# Patient Record
Sex: Male | Born: 1970 | Race: Black or African American | Hispanic: No | Marital: Married | Smoking: Former smoker
Health system: Southern US, Community
[De-identification: ages and names within clinical notes are randomized; demographics above are authoritative.]

## PROBLEM LIST (undated history)

## (undated) DIAGNOSIS — L732 Hidradenitis suppurativa: Secondary | ICD-10-CM

## (undated) DIAGNOSIS — R569 Unspecified convulsions: Secondary | ICD-10-CM

## (undated) DIAGNOSIS — K223 Perforation of esophagus: Secondary | ICD-10-CM

## (undated) DIAGNOSIS — I10 Essential (primary) hypertension: Secondary | ICD-10-CM

## (undated) HISTORY — DX: Unspecified convulsions: R56.9

## (undated) HISTORY — DX: Perforation of esophagus: K22.3

---

## 2014-05-31 ENCOUNTER — Emergency Department (HOSPITAL_COMMUNITY): Admission: EM | Admit: 2014-05-31 | Discharge: 2014-05-31 | Discharge status: Absent without leave | Payer: Self-pay

## 2014-05-31 MED ORDER — ACETAMINOPHEN 325 MG PO TABS
ORAL_TABLET | ORAL | Status: AC
  Filled 2014-05-31: qty 2

## 2015-01-04 ENCOUNTER — Inpatient Hospital Stay: Payer: No Typology Code available for payment source | Admitting: Internal Medicine

## 2015-01-04 ENCOUNTER — Inpatient Hospital Stay: Payer: No Typology Code available for payment source

## 2015-01-04 ENCOUNTER — Emergency Department: Payer: No Typology Code available for payment source

## 2015-01-04 ENCOUNTER — Inpatient Hospital Stay
Admission: EM | Admit: 2015-01-04 | Discharge: 2015-01-05 | DRG: 305 | Disposition: A | Discharge status: Home / Self Care | Payer: No Typology Code available for payment source | Attending: Internal Medicine | Admitting: Internal Medicine

## 2015-01-04 DIAGNOSIS — N179 Acute kidney failure, unspecified: Secondary | ICD-10-CM | POA: Diagnosis present

## 2015-01-04 DIAGNOSIS — E860 Dehydration: Secondary | ICD-10-CM | POA: Diagnosis present

## 2015-01-04 DIAGNOSIS — K2100 Gastro-esophageal reflux disease with esophagitis, without bleeding: Secondary | ICD-10-CM | POA: Diagnosis present

## 2015-01-04 DIAGNOSIS — I10 Essential (primary) hypertension: Secondary | ICD-10-CM

## 2015-01-04 DIAGNOSIS — R4182 Altered mental status, unspecified: Secondary | ICD-10-CM | POA: Diagnosis present

## 2015-01-04 DIAGNOSIS — I160 Hypertensive urgency: Principal | ICD-10-CM | POA: Diagnosis present

## 2015-01-04 DIAGNOSIS — Z91128 Patient's intentional underdosing of medication regimen for other reason: Secondary | ICD-10-CM

## 2015-01-04 DIAGNOSIS — I151 Hypertension secondary to other renal disorders: Secondary | ICD-10-CM

## 2015-01-04 DIAGNOSIS — K529 Noninfective gastroenteritis and colitis, unspecified: Secondary | ICD-10-CM | POA: Diagnosis present

## 2015-01-04 DIAGNOSIS — R112 Nausea with vomiting, unspecified: Secondary | ICD-10-CM

## 2015-01-04 DIAGNOSIS — E872 Acidosis: Secondary | ICD-10-CM | POA: Diagnosis present

## 2015-01-04 DIAGNOSIS — T461X6A Underdosing of calcium-channel blockers, initial encounter: Secondary | ICD-10-CM | POA: Diagnosis present

## 2015-01-04 DIAGNOSIS — F17210 Nicotine dependence, cigarettes, uncomplicated: Secondary | ICD-10-CM | POA: Diagnosis present

## 2015-01-04 HISTORY — DX: Essential (primary) hypertension: I10

## 2015-01-04 LAB — CREATININE WHOLE BLOOD: Whole Blood Creatinine: 0.71 mg/dL (ref 0.40–1.10)

## 2015-01-04 LAB — HEMOLYSIS INDEX
Hemolysis Index: 12 (ref 0–18)
Hemolysis Index: 258 — ABNORMAL HIGH (ref 0–18)
Hemolysis Index: 221 — ABNORMAL HIGH (ref 0–18)
Hemolysis Index: 15 (ref 0–18)

## 2015-01-04 LAB — TROPONIN I
Troponin I: 0.02 ng/mL (ref 0.00–0.09)
Troponin I: 0.01 ng/mL (ref 0.00–0.09)
Troponin I: <0.01 ng/mL (ref 0.00–0.09)

## 2015-01-04 LAB — CBC AND DIFFERENTIAL
Basophils Absolute Automated: 0.04 10*3/uL (ref 0.00–0.20)
Basophils Automated: 0 %
Eosinophils Absolute Automated: 0.03 10*3/uL (ref 0.00–0.70)
Eosinophils Automated: 0 %
Hematocrit: 47.2 % (ref 42.0–52.0)
Hgb: 16.9 g/dL (ref 13.0–17.0)
Immature Granulocytes Absolute: 0.01 10*3/uL
Immature Granulocytes: 0 %
Lymphocytes Absolute Automated: 1.9 10*3/uL (ref 0.50–4.40)
Lymphocytes Automated: 25 %
MCH: 34.7 pg — ABNORMAL HIGH (ref 28.0–32.0)
MCHC: 35.8 g/dL (ref 32.0–36.0)
MCV: 96.9 fL (ref 80.0–100.0)
MPV: 10.8 fL (ref 9.4–12.3)
Monocytes Absolute Automated: 0.38 10*3/uL (ref 0.00–1.20)
Monocytes: 5 %
Neutrophils Absolute: 5.26 10*3/uL (ref 1.80–8.10)
Neutrophils: 69 %
Nucleated RBC: 0 /100 WBC (ref 0–1)
Platelets: 261 10*3/uL (ref 140–400)
RBC: 4.87 10*6/uL (ref 4.70–6.00)
RDW: 14 % (ref 12–15)
WBC: 7.61 10*3/uL (ref 3.50–10.80)

## 2015-01-04 LAB — GLUCOSE WHOLE BLOOD - POCT
Whole Blood Glucose POCT: 112 mg/dL — ABNORMAL HIGH (ref 70–100)
Whole Blood Glucose POCT: 120 mg/dL — ABNORMAL HIGH (ref 70–100)

## 2015-01-04 LAB — CK
Creatine Kinase (CK): 185 U/L (ref 47–267)
Creatine Kinase (CK): 158 U/L (ref 47–267)
Creatine Kinase (CK): 217 U/L (ref 47–267)

## 2015-01-04 LAB — COMPREHENSIVE METABOLIC PANEL
ALT: 11 U/L (ref 0–55)
AST (SGOT): 15 U/L (ref 5–34)
Albumin/Globulin Ratio: 1.1 (ref 0.9–2.2)
Albumin: 4.7 g/dL (ref 3.5–5.0)
Alkaline Phosphatase: 70 U/L (ref 38–106)
Anion Gap: 17 — ABNORMAL HIGH (ref 5.0–15.0)
BUN: 7 mg/dL — ABNORMAL LOW (ref 9.0–28.0)
Bilirubin, Total: 0.8 mg/dL (ref 0.2–1.2)
CO2: 20 mEq/L — ABNORMAL LOW (ref 22–29)
Calcium: 10.3 mg/dL (ref 8.5–10.5)
Chloride: 109 mEq/L (ref 100–111)
Creatinine: 1 mg/dL (ref 0.7–1.3)
Globulin: 4.1 g/dL — ABNORMAL HIGH (ref 2.0–3.6)
Glucose: 127 mg/dL — ABNORMAL HIGH (ref 70–100)
Potassium: 3.7 mEq/L (ref 3.5–5.1)
Protein, Total: 8.8 g/dL — ABNORMAL HIGH (ref 6.0–8.3)
Sodium: 146 mEq/L — ABNORMAL HIGH (ref 136–145)

## 2015-01-04 LAB — URINALYSIS, REFLEX TO MICROSCOPIC EXAM IF INDICATED
Bilirubin, UA: NEGATIVE
Blood, UA: NEGATIVE
Glucose, UA: 50 — AB
Leukocyte Esterase, UA: NEGATIVE
Nitrite, UA: NEGATIVE
Protein, UR: 100 — AB
Specific Gravity UA: 1.014 (ref 1.001–1.035)
Urine pH: 7 (ref 5.0–8.0)
Urobilinogen, UA: NEGATIVE mg/dL

## 2015-01-04 LAB — ECG 12-LEAD
Atrial Rate: 65 {beats}/min
Atrial Rate: 67 {beats}/min
P Axis: 52 degrees
P Axis: 56 degrees
P-R Interval: 200 ms
P-R Interval: 212 ms
Q-T Interval: 438 ms
Q-T Interval: 454 ms
QRS Duration: 118 ms
QRS Duration: 126 ms
QTC Calculation (Bezet): 455 ms
QTC Calculation (Bezet): 479 ms
R Axis: 74 degrees
R Axis: 81 degrees
T Axis: 44 degrees
T Axis: 55 degrees
Ventricular Rate: 65 {beats}/min
Ventricular Rate: 67 {beats}/min

## 2015-01-04 LAB — LACTIC ACID, PLASMA
Lactic Acid: 2.7 mmol/L — ABNORMAL HIGH (ref 0.2–2.0)
Lactic Acid: 2.3 mmol/L — ABNORMAL HIGH (ref 0.2–2.0)

## 2015-01-04 LAB — PT AND APTT
PT INR: 1.1 (ref 0.9–1.1)
PT: 13.7 s (ref 12.6–15.0)
PTT: 32 s (ref 23–37)

## 2015-01-04 LAB — RAPID DRUG SCREEN, URINE
Barbiturate Screen, UR: NEGATIVE
Benzodiazepine Screen, UR: NEGATIVE
Cannabinoid Screen, UR: POSITIVE — AB
Cocaine, UR: NEGATIVE
Opiate Screen, UR: POSITIVE — AB
PCP Screen, UR: NEGATIVE
Urine Amphetamine Screen: NEGATIVE

## 2015-01-04 LAB — GFR: EGFR: >60

## 2015-01-04 LAB — LIPASE: Lipase: 8 U/L (ref 8–78)

## 2015-01-04 LAB — IHS D-DIMER: D-Dimer: 0.3 ug/mL FEU (ref 0.00–0.51)

## 2015-01-04 MED ORDER — SODIUM CHLORIDE 0.9 % IV BOLUS
1000.0000 mL | Freq: Once | INTRAVENOUS | Status: AC
Start: 2015-01-04 — End: 2015-01-04
  Administered 2015-01-04: 1000 mL via INTRAVENOUS

## 2015-01-04 MED ORDER — MORPHINE SULFATE 4 MG/ML IJ/IV SOLN (WRAP)
4.0000 mg | Freq: Once | Status: AC
Start: 2015-01-04 — End: 2015-01-04
  Administered 2015-01-04: 4 mg via INTRAVENOUS
  Filled 2015-01-04: qty 1

## 2015-01-04 MED ORDER — ONDANSETRON HCL 4 MG/2ML IJ SOLN
4.0000 mg | Freq: Four times a day (QID) | INTRAMUSCULAR | Status: DC | PRN
Start: 2015-01-04 — End: 2015-01-05

## 2015-01-04 MED ORDER — METRONIDAZOLE IN NACL 500 MG/100 ML IV SOLN
500.0000 mg | Freq: Once | INTRAVENOUS | Status: AC
Start: 2015-01-04 — End: 2015-01-04
  Administered 2015-01-04: 500 mg via INTRAVENOUS
  Filled 2015-01-04: qty 100

## 2015-01-04 MED ORDER — HYDRALAZINE HCL 20 MG/ML IJ SOLN
10.0000 mg | Freq: Once | INTRAMUSCULAR | Status: DC | PRN
Start: 2015-01-04 — End: 2015-01-05

## 2015-01-04 MED ORDER — PROMETHAZINE HCL 25 MG/ML IJ SOLN
12.5000 mg | Freq: Once | INTRAMUSCULAR | Status: AC
Start: 2015-01-04 — End: 2015-01-04
  Administered 2015-01-04: 12.5 mg via INTRAVENOUS
  Filled 2015-01-04: qty 1

## 2015-01-04 MED ORDER — METOCLOPRAMIDE HCL 5 MG/ML IJ SOLN
10.0000 mg | Freq: Once | INTRAMUSCULAR | Status: AC
Start: 2015-01-04 — End: 2015-01-04
  Administered 2015-01-04: 10 mg via INTRAVENOUS
  Filled 2015-01-04: qty 2

## 2015-01-04 MED ORDER — LISINOPRIL 5 MG PO TABS
5.0000 mg | ORAL_TABLET | Freq: Every day | ORAL | Status: DC
Start: 2015-01-04 — End: 2015-01-05

## 2015-01-04 MED ORDER — INFLUENZA VAC SPLIT QUAD 0.5 ML IM SUSY
0.5000 mL | PREFILLED_SYRINGE | Freq: Once | INTRAMUSCULAR | Status: DC
Start: 2015-01-04 — End: 2015-01-05

## 2015-01-04 MED ORDER — NITROGLYCERIN 2 % TD OINT
TOPICAL_OINTMENT | TRANSDERMAL | Status: AC
Start: 2015-01-04 — End: 2015-01-04
  Filled 2015-01-04: qty 1

## 2015-01-04 MED ORDER — SODIUM CHLORIDE 0.9 % IV SOLN
5.0000 mg/h | INTRAVENOUS | Status: DC
Start: 2015-01-04 — End: 2015-01-05
  Administered 2015-01-04: 10 mg/h via INTRAVENOUS
  Administered 2015-01-04: 5 mg/h via INTRAVENOUS
  Administered 2015-01-04: 12 mg/h via INTRAVENOUS
  Filled 2015-01-04 (×4): qty 10

## 2015-01-04 MED ORDER — CIPROFLOXACIN IN D5W 400 MG/200ML IV SOLN
400.0000 mg | Freq: Once | INTRAVENOUS | Status: AC
Start: 2015-01-04 — End: 2015-01-04
  Administered 2015-01-04: 400 mg via INTRAVENOUS
  Filled 2015-01-04: qty 200

## 2015-01-04 MED ORDER — PROMETHAZINE HCL 25 MG/ML IJ SOLN
12.5000 mg | Freq: Four times a day (QID) | INTRAMUSCULAR | Status: AC | PRN
Start: 2015-01-04 — End: 2015-01-05
  Administered 2015-01-04 (×2): 12.5 mg via INTRAVENOUS
  Filled 2015-01-04 (×2): qty 1

## 2015-01-04 MED ORDER — ONDANSETRON HCL 4 MG/2ML IJ SOLN
4.0000 mg | Freq: Once | INTRAMUSCULAR | Status: AC
Start: 2015-01-04 — End: 2015-01-04
  Administered 2015-01-04: 4 mg via INTRAVENOUS
  Filled 2015-01-04: qty 2

## 2015-01-04 MED ORDER — IOHEXOL 350 MG/ML IV SOLN
100.0000 mL | Freq: Once | INTRAVENOUS | Status: AC | PRN
Start: 2015-01-04 — End: 2015-01-04
  Administered 2015-01-04: 100 mL via INTRAVENOUS

## 2015-01-04 MED ORDER — ENOXAPARIN SODIUM 40 MG/0.4ML SC SOLN
40.0000 mg | Freq: Every day | SUBCUTANEOUS | Status: DC
Start: 2015-01-04 — End: 2015-01-05
  Administered 2015-01-04 – 2015-01-05 (×2): 40 mg via SUBCUTANEOUS
  Filled 2015-01-04 (×2): qty 0.4

## 2015-01-04 MED ORDER — DEXMEDETOMIDINE HCL IN NACL 400 MCG/100ML IV SOLN
0.4000 ug/kg/h | INTRAVENOUS | Status: DC
Start: 2015-01-04 — End: 2015-01-04

## 2015-01-04 MED ORDER — MORPHINE SULFATE 10 MG/ML IJ/IV SOLN (WRAP)
6.0000 mg | Status: DC | PRN
Start: 2015-01-04 — End: 2015-01-05
  Administered 2015-01-04: 6 mg via INTRAVENOUS
  Filled 2015-01-04: qty 1

## 2015-01-04 MED ORDER — LABETALOL HCL 5 MG/ML IV SOLN
20.0000 mg | Freq: Once | INTRAVENOUS | Status: AC
Start: 2015-01-04 — End: 2015-01-04
  Administered 2015-01-04: 20 mg via INTRAVENOUS
  Filled 2015-01-04: qty 4

## 2015-01-04 MED ORDER — VANCOMYCIN ORAL SOLUTION 50 MG/ML UNIT DOSE
125.0000 mg | Freq: Four times a day (QID) | ORAL | Status: DC
Start: 2015-01-04 — End: 2015-01-05
  Administered 2015-01-04 – 2015-01-05 (×5): 125 mg via ORAL
  Filled 2015-01-04 (×8): qty 5

## 2015-01-04 MED ORDER — SODIUM CHLORIDE 0.9 % IV SOLN
INTRAVENOUS | Status: DC
Start: 2015-01-04 — End: 2015-01-05

## 2015-01-04 MED ORDER — DEXMEDETOMIDINE HCL IN NACL 400 MCG/100ML IV SOLN
0.4000 ug/kg/h | INTRAVENOUS | Status: DC
Start: 2015-01-04 — End: 2015-01-05
  Administered 2015-01-04: 0.4 ug/kg/h via INTRAVENOUS
  Administered 2015-01-05: 0.3 ug/kg/h via INTRAVENOUS
  Filled 2015-01-04 (×5): qty 100

## 2015-01-04 MED ORDER — CLONIDINE HCL 0.1 MG PO TABS
0.1000 mg | ORAL_TABLET | Freq: Once | ORAL | Status: AC
Start: 2015-01-04 — End: 2015-01-04
  Administered 2015-01-04: 0.1 mg via ORAL
  Filled 2015-01-04: qty 1

## 2015-01-04 MED ORDER — MORPHINE SULFATE 10 MG/ML IJ/IV SOLN (WRAP)
6.0000 mg | Freq: Once | Status: AC
Start: 2015-01-04 — End: 2015-01-04
  Administered 2015-01-04: 6 mg via INTRAVENOUS
  Filled 2015-01-04: qty 1

## 2015-01-04 MED ORDER — HYDRALAZINE HCL 20 MG/ML IJ SOLN
10.0000 mg | Freq: Once | INTRAMUSCULAR | Status: AC
Start: 2015-01-04 — End: 2015-01-04
  Administered 2015-01-04: 10 mg via INTRAVENOUS
  Filled 2015-01-04: qty 1

## 2015-01-04 NOTE — H&P (Addendum)
[facility]      Patient: Mike Rice  Date: 01/04/2015   DOB: 1970/06/17  Admission Date: 01/04/2015   MRN: [id number]  Attending: Jolyn Lent         Chief Complaint   Patient presents with   . Emesis   . Chills      History Gathered From: the patient and his wife at the bedside     HISTORY AND PHYSICAL     Mike Rice is a 44 y.o. male with a PMHx of uncontrolled HTN and non compliance with medication who presented with nausea and vomiting for few hours, not able to sleep due to vomiting, associated with sweating, nausea, epigastric and periumbilical pain.   He had cyst removed from his chin 2 weeks ago and his BP reported by his wife as 230/160 at that time and he was told to see doctor to start Antihypertensive but he didnt do so, he is visiting from [place]   Smokes 1 PPD, social Alcohol, denies drug abuse     Past Medical History   Diagnosis Date   . Hypertension        History reviewed. No pertinent past surgical history.    Prior to Admission medications    Medication Sig Start Date End Date Taking? Authorizing Provider   DOXYCYCLINE PO Take by mouth.   Yes [provider]       No Known Allergies    CODE STATUS: full code     PRIMARY CARE MD: No primary care provider on file.    History reviewed. No pertinent family history.    Social History   Substance Use Topics   . Smoking status: Unknown If Ever Smoked   . Smokeless tobacco: None   . Alcohol Use: None       REVIEW OF SYSTEMS   Positive for: nausea, vomiting, abdominal pain, chills, sweating  Negative for: headache, neck stiffness, dizziness, cough CP, SOB, diarrhea   All ROS completed and otherwise negative.    PHYSICAL EXAM     Vital Signs (most recent): BP 227/122 mmHg  Pulse 76  Temp(Src) 97.7 F (36.5 C) (Oral)  Resp 16  Ht 1.854 m (6\' 1" )  Wt 86.183 kg (190 lb)  BMI 25.07 kg/m2  SpO2 99%  Constitutional: patient looks distressed diaphoretic.  Patient speaks freely in full sentences.   HEENT:  NC/AT, PERRL, no scleral icterus or conjunctival pallor, no nasal discharge, MMM, oropharynx without erythema or exudate  Neck: trachea midline, supple, no cervical or supraclavicular lymphadenopathy or masses  Cardiovascular: RRR, normal S1 S2, no murmurs, gallops, palpable thrills, no JVD, Non-displaced PMI.  Respiratory: Normal rate. No retractions or increased work of breathing. Clear to auscultation and percussion bilaterally.  Gastrointestinal: +BS, non-distended, soft, epigastric tenderness, no rebound or guarding, no hepatosplenomegaly  Genitourinary: no suprapubic or costovertebral angle tenderness  Musculoskeletal: ROM and motor strength grossly normal. No clubbing, edema, or cyanosis. DP and radial pulses 2+ and symmetric.  Skin exam:  pink  Neurologic: EOMI, CN 2-12 grossly intact. no gross motor or sensory deficits  Psychiatric: AAOx3, affect and mood appropriate. The patient is alert, interactive, appropriate.  Capillary refill:  Normal    Exam done by Jolyn Lent, MD on 01/04/2015 at 7:55 AM      LABS & IMAGING     Recent Results (from the past 24 hour(s))   Comprehensive metabolic panel    Collection Time: 01/04/15  4:08 AM  Result Value Ref Range    Glucose 127 (H) 70 - 100 mg/dL    BUN 7.0 (L) 9.0 - 16.1 mg/dL    Creatinine 1.0 0.7 - 1.3 mg/dL    Sodium 096 (H) 045 - 145 mEq/L    Potassium 3.7 3.5 - 5.1 mEq/L    Chloride 109 100 - 111 mEq/L    CO2 20 (L) 22 - 29 mEq/L    Calcium 10.3 8.5 - 10.5 mg/dL    Protein, Total 8.8 (H) 6.0 - 8.3 g/dL    Albumin 4.7 3.5 - 5.0 g/dL    AST (SGOT) 15 5 - 34 U/L    ALT 11 0 - 55 U/L    Alkaline Phosphatase 70 38 - 106 U/L    Bilirubin, Total 0.8 0.2 - 1.2 mg/dL    Globulin 4.1 (H) 2.0 - 3.6 g/dL    Albumin/Globulin Ratio 1.1 0.9 - 2.2    Anion Gap 17.0 (H) 5.0 - 15.0   CBC with differential    Collection Time: 01/04/15  4:08 AM   Result Value Ref Range    WBC 7.61 3.50 - 10.80 x10 3/uL    Hgb 16.9 13.0 - 17.0 g/dL    Hematocrit 40.9 81.1 - 52.0 %     Platelets 261 140 - 400 x10 3/uL    RBC 4.87 4.70 - 6.00 x10 6/uL    MCV 96.9 80.0 - 100.0 fL    MCH 34.7 (H) 28.0 - 32.0 pg    MCHC 35.8 32.0 - 36.0 g/dL    RDW 14 12 - 15 %    MPV 10.8 9.4 - 12.3 fL    Neutrophils 69 None %    Lymphocytes Automated 25 None %    Monocytes 5 None %    Eosinophils Automated 0 None %    Basophils Automated 0 None %    Immature Granulocyte 0 None %    Nucleated RBC 0 0 - 1 /100 WBC    Neutrophils Absolute 5.26 1.80 - 8.10 x10 3/uL    Abs Lymph Automated 1.90 0.50 - 4.40 x10 3/uL    Abs Mono Automated 0.38 0.00 - 1.20 x10 3/uL    Abs Eos Automated 0.03 0.00 - 0.70 x10 3/uL    Absolute Baso Automated 0.04 0.00 - 0.20 x10 3/uL    Absolute Immature Granulocyte 0.01 0 x10 3/uL   Lipase    Collection Time: 01/04/15  4:08 AM   Result Value Ref Range    Lipase 8 8 - 78 U/L   Hemolysis index    Collection Time: 01/04/15  4:08 AM   Result Value Ref Range    Hemolysis Index 12 0 - 18   GFR    Collection Time: 01/04/15  4:08 AM   Result Value Ref Range    EGFR >60.0    Troponin I    Collection Time: 01/04/15  4:08 AM   Result Value Ref Range    Troponin I 0.02 0.00 - 0.09 ng/mL   PT/APTT    Collection Time: 01/04/15  4:08 AM   Result Value Ref Range    PT 13.7 12.6 - 15.0 sec    PT INR 1.1 0.9 - 1.1    PT Anticoag. Given Within 48 hrs. None     PTT 32 23 - 37 sec   Glucose Whole Blood - POCT    Collection Time: 01/04/15  4:25 AM   Result Value Ref Range    POCT -  Glucose Whole blood 112 (H) 70 - 100 mg/dL   Creatinine-Whole Blood    Collection Time: 01/04/15  4:30 AM   Result Value Ref Range    Creatinine Whole Blood 0.71 0.40 - 1.10 mg/dL   UA, Reflex to Microscopic (pts 3 + yrs)    Collection Time: 01/04/15  4:46 AM   Result Value Ref Range    Urine Type Clean Catch     Color, UA Yellow Clear - Yellow    Clarity, UA Clear Clear - Hazy    Specific Gravity UA 1.014 1.001-1.035    Urine pH 7.0 5.0-8.0    Leukocyte Esterase, UA Negative Negative    Nitrite, UA Negative Negative    Protein, UR 100  (A) Negative    Glucose, UA 50 (A) Negative    Ketones UA Trace (A) Negative    Urobilinogen, UA Negative 0.2  -  2.0 mg/dL    Bilirubin, UA Negative Negative    Blood, UA Negative Negative    RBC, UA 0 - 5 0 - 5 /hpf    WBC, UA 0 - 5 0 - 5 /hpf    Squamous Epithelial Cells, Urine 0 - 5 0 - 25 /hpf   Lactic Acid    Collection Time: 01/04/15  6:27 AM   Result Value Ref Range    Lactic acid 2.7 (H) 0.2 - 2.0 mmol/L   Lactic Acid    Collection Time: 01/04/15  6:40 AM   Result Value Ref Range    Lactic acid 2.3 (H) 0.2 - 2.0 mmol/L       MICROBIOLOGY:  Blood Culture:in process     Antibiotics Started: cipro and flagyl     IMAGING:  Upon my review:   Chest 2 Views    01/04/2015  Normal. Adaline Sill, MD 01/04/2015 5:48 AM     Ct Angio Aaa Chest/ Abdomen    01/04/2015  Infectious/inflammatory colitis transverse colon. No cardiovascular abnormalities. Adaline Sill, MD 01/04/2015 5:12 AM       CARDIAC:  EKG Interpretation (upon my review):  NSR with left ventricular hypertrophy     Markers:    Recent Labs  Lab 01/04/15  0408   TROPONIN I 0.02       EMERGENCY DEPARTMENT COURSE:  Orders Placed This Encounter   Procedures   . Rapid Influenza A/B Antigens   . Blood Culture Aerobic/Anaerobic #1   . Blood Culture Aerobic/Anaerobic #2   . CT Angio AAA Chest/ Abdomen   . Chest 2 Views   . Comprehensive metabolic panel   . CBC with differential   . Lipase   . UA, Reflex to Microscopic (pts 3 + yrs)   . Hemolysis index   . GFR   . Troponin I   . PT/APTT   . Creatinine-Whole Blood   . Lactic Acid   . Lactic Acid   . Lactic acid, plasma - Sepsis specimen #2, follow-up to positive initial lactate value.   . Glucose Whole Blood - POCT   . ECG 12 lead   . Saline lock IV   . Admit to Inpatient   . [facility] ED Bed Request       ASSESSMENT & PLAN     Mike Rice is a 44 y.o. male admitted [facility] with Hypertensive urgency.    Patient Active Hospital Problem List:   Hypertensive urgency (01/04/2015)     Hypertension (01/04/2015)    Assessment: not controlled with IV  medication   IV Labetalol 20 mg IVP once     Plan: transfer to ICU  Management of Hypertensive emergency as per ICU    CT head stat   Urine drug screen   Neurology and cardiology evaluation     Transverse Colitis / nausea, vomiting / epigastric pain (01/04/2015)    Assessment: acute  IV hydration   NPO   Cipro/ flagyl       Acute lactic acidosis secondary to colitis   IV hydration and continue to monitor     Nutrition  NPO     DVT/VTE Prophylaxis  lovenox     Anticipated medical stability for discharge: >2 nights    Service status/Reason for ongoing hospitalization: inpatient / hypertensive urgency / colitis   Anticipated Discharge Needs: TBD    Terrace Arabia    01/04/2015 7:55 AM  Time Elapsed: 70 minutes

## 2015-01-04 NOTE — Progress Notes (Signed)
CRITICAL CARE ADMISSION    Date Time: 01/04/2015 9:07 AM  Patient Name: Mike Rice  Attending Physician: Jolyn Lent, MD    Hospital Problem List:   Patient Active Problem List   Diagnosis   . Colitis   . Hypertension   . Hypertensive urgency   . Gastroesophageal reflux disease with esophagitis       ICU Problem List: Malignant hypertension, Altered mental status, GERD and Infectious colitis, enteritis and gastroenteritis and suspected drug use    Assessment:   44 y.o. male presenting to the ED with sudden onset of vomiting for the past 4 hours. Patient consumed alcohol 10 hours ago and was asymptomatic at that time. Patient was not able to go to sleep tonight, and reports acute onset of vomiting 4 hours ago. He is diaphoretic and is having associated epigastric abdominal pain, chills, and diaphoresis. He does not have pertinent past medical history. He is from [place] and visiting family in the area. He is currently taking Doxycycline daily for acne. He used to be on Lisinopril, but has not taken it for the past 2 years. He recently had cyst surgically excised from his chin, and the area is healing well.     He is transferred to ICU with AMS, malignant HTN, and vomiting.    Plan:   IV Cardene. Urine toxicology screen. IV antiemetics. Oral Vancomycin.    History of Present Illness:   Mike Rice is a 44 y.o. male who presents to the hospital with Hypertensive Urgency.    Past Medical History:     Past Medical History   Diagnosis Date   . Hypertension        Past Surgical History:   History reviewed. No pertinent past surgical history.    Family History:   History reviewed. No pertinent family history.    Social History:     Social History     Social History   . Marital Status: Married     Spouse Name: N/A   . Number of Children: N/A   . Years of Education: N/A     Social History Main Topics   . Smoking status: Unknown If Ever Smoked   . Smokeless tobacco: Not on file   . Alcohol  Use: Not on file   . Drug Use: Not on file   . Sexual Activity: Not on file     Other Topics Concern   . Not on file     Social History Narrative   . No narrative on file       Home Medications:     Prescriptions prior to admission   Medication Sig Dispense Refill Last Dose   . DOXYCYCLINE PO Take by mouth.           Allergies:   No Known Allergies    Inpatient Medications:      Scheduled Meds: PRN Meds:      lisinopril 5 mg Oral Daily   nitroglycerin      vancomycin 125 mg Oral 4 times per day       Continuous Infusions:  . niCARdipine        hydrALAZINE 10 mg Once PRN   morphine 6 mg Q4H PRN   ondansetron 4 mg 4X Daily PRN   promethazine 12.5 mg Q6H PRN           Review of Systems:   A comprehensive review of systems was: History obtained from chart review  Physical Exam:     VITAL SIGNS   Temp:  [96 F (35.6 C)-97.9 F (36.6 C)] 97.7 F (36.5 C)  Heart Rate:  [63-78] 75  Resp Rate:  [16-26] 16  BP: (206-240)/(105-136) 224/128 mmHg  Blood Glucose:  Pulse ox:  Telemetry:     No intake or output data in the 24 hours ending 01/04/15 0907     Motor: 6- obeys commands bilateral  Verbal: 4 - confused  Eye: 4 - spontaneous  Glasgow Coma Scale: 14    Invasive ICU Hemodynamics:                   Vent Settings:    IBW:      Lines/Drains/Airways:    Patient Lines/Drains/Airways Status    Active PICC Line / CVC Line / PIV Line / Drain / Airway / Intraosseous Line / Epidural Line / ART Line / Line / Wound / Pressure Ulcer / NG/OG Tube     Name:   Placement date:   Placement time:   Site:   Days:    Peripheral IV Left Antecubital        Antecubital       Peripheral IV 01/04/15 Right Antecubital  01/04/15   0700   Antecubital   less than 1                General appearance - anxious, in mild to moderate distress and ill-appearing  Mental status - alert, oriented to person, place, and time  Eyes - pupils equal and reactive, extraocular eye movements intact  Ears - hearing grossly normal bilaterally  Nose - normal and  patent, no erythema, discharge or polyps  Mouth - mucous membranes moist, pharynx normal without lesions  Neck - supple, no significant adenopathy  Lymphatics - no palpable lymphadenopathy, no hepatosplenomegaly  Chest - clear to auscultation, no wheezes, rales or rhonchi, symmetric air entry  Heart - normal rate, regular rhythm, normal S1, S2, no murmurs, rubs, clicks or gallops  Abdomen - soft, nontender, nondistended, no masses or organomegaly  Back exam - full range of motion, no tenderness, palpable spasm or pain on motion  Neurological - alert, oriented, normal speech, no focal findings or movement disorder noted  Musculoskeletal - no joint tenderness, deformity or swelling  Extremities - peripheral pulses normal, no pedal edema, no clubbing or cyanosis, Capillary refill < 2 seconds  Skin - normal coloration and turgor, no rashes, no suspicious skin lesions noted    Labs:     Results     Procedure Component Value Units Date/Time    Lactic Acid [[id number]]  (Abnormal) Collected:  01/04/15 0640    Specimen Information:  Blood Updated:  01/04/15 0706     Lactic acid 2.3 (H) mmol/L     Narrative:      REPEAT    Blood Culture Aerobic/Anaerobic #1 [[id number]] Collected:  01/04/15 0640    Specimen Information:  Arm from Blood Updated:  01/04/15 0641    Narrative:      1 BLUE+1 PURPLE    Blood Culture Aerobic/Anaerobic #2 [[id number]] Collected:  01/04/15 0640    Specimen Information:  Arm from Blood Updated:  01/04/15 0641    Narrative:      1 BLUE+1 PURPLE    Lactic Acid [[id number]]  (Abnormal) Collected:  01/04/15 0627    Specimen Information:  Blood Updated:  01/04/15 8413     Lactic acid 2.7 (H) mmol/L     UA, Reflex to  Microscopic (pts 3 + yrs) [[id number]]  (Abnormal) Collected:  01/04/15 0446    Specimen Information:  Urine Updated:  01/04/15 0507     Urine Type Clean Catch      Color, UA Yellow      Clarity, UA Clear      Specific Gravity UA 1.014      Urine pH 7.0      Leukocyte Esterase, UA Negative       Nitrite, UA Negative      Protein, UR 100 (A)      Glucose, UA 50 (A)      Ketones UA Trace (A)      Urobilinogen, UA Negative mg/dL      Bilirubin, UA Negative      Blood, UA Negative      RBC, UA 0 - 5 /hpf      WBC, UA 0 - 5 /hpf      Squamous Epithelial Cells, Urine 0 - 5 /hpf     Troponin I [[id number]] Collected:  01/04/15 0408    Specimen Information:  Blood Updated:  01/04/15 0447     Troponin I 0.02 ng/mL     Creatinine-Whole Blood [[id number]] Collected:  01/04/15 0430     Creatinine Whole Blood 0.71 mg/dL Updated:  28/41/32 4401    Comprehensive metabolic panel [[id number]]  (Abnormal) Collected:  01/04/15 0408    Specimen Information:  Blood Updated:  01/04/15 0440     Glucose 127 (H) mg/dL      BUN 7.0 (L) mg/dL      Creatinine 1.0 mg/dL      Sodium 403 (H) mEq/L      Potassium 3.7 mEq/L      Chloride 109 mEq/L      CO2 20 (L) mEq/L      Calcium 10.3 mg/dL      Protein, Total 8.8 (H) g/dL      Albumin 4.7 g/dL      AST (SGOT) 15 U/L      ALT 11 U/L      Alkaline Phosphatase 70 U/L      Bilirubin, Total 0.8 mg/dL      Globulin 4.1 (H) g/dL      Albumin/Globulin Ratio 1.1      Anion Gap 17.0 (H)     Lipase [[id number]] Collected:  01/04/15 0408    Specimen Information:  Blood Updated:  01/04/15 0440     Lipase 8 U/L     Hemolysis index [[id number]] Collected:  01/04/15 0408     Hemolysis Index 12 Updated:  01/04/15 0440    GFR [[id number]] Collected:  01/04/15 0408     EGFR >60.0 Updated:  01/04/15 0440    Rapid Influenza A/B Antigens [[id number]] Collected:  01/04/15 0408    Specimen Information:  Nasopharyngeal from Nasal Aspirate Updated:  01/04/15 0437    Narrative:      ORDER#: [id number]                                    ORDERED BY: Ander Slade, REA  SOURCE: Nasal Aspirate                               COLLECTED:  01/04/15 04:08  ANTIBIOTICS AT COLL.:  RECEIVED :  01/04/15 04:16  Influenza Rapid Antigen A&B                FINAL       01/04/15 04:37  01/04/15   Negative for  Influenza A and B             Reference Range: Negative      PT/APTT [[id number]] Collected:  01/04/15 0408     PT 13.7 sec Updated:  01/04/15 0429     PT INR 1.1      PT Anticoag. Given Within 48 hrs. None      PTT 32 sec     Glucose Whole Blood - POCT [[id number]]  (Abnormal) Collected:  01/04/15 0425     POCT - Glucose Whole blood 112 (H) mg/dL Updated:  78/29/56 2130    CBC with differential [[id number]]  (Abnormal) Collected:  01/04/15 0408    Specimen Information:  Blood from Blood Updated:  01/04/15 0419     WBC 7.61 x10 3/uL      Hgb 16.9 g/dL      Hematocrit 29.5 %      Platelets 261 x10 3/uL      RBC 4.87 x10 6/uL      MCV 96.9 fL      MCH 34.7 (H) pg      MCHC 35.8 g/dL      RDW 14 %      MPV 10.8 fL      Neutrophils 69 %      Lymphocytes Automated 25 %      Monocytes 5 %      Eosinophils Automated 0 %      Basophils Automated 0 %      Immature Granulocyte 0 %      Nucleated RBC 0 /100 WBC      Neutrophils Absolute 5.26 x10 3/uL      Abs Lymph Automated 1.90 x10 3/uL      Abs Mono Automated 0.38 x10 3/uL      Abs Eos Automated 0.03 x10 3/uL      Absolute Baso Automated 0.04 x10 3/uL      Absolute Immature Granulocyte 0.01 x10 3/uL             Rads:     Radiology Results (24 Hour)     Procedure Component Value Units Date/Time    CT Head WO Contrast [[id number]]     Order Status:  Sent Updated:  01/04/15 0856    Chest 2 Views [[id number]] Collected:  01/04/15 0547    Order Status:  Completed Updated:  01/04/15 0552    Narrative:      Examination: Frontal lateral chest.    HISTORY: Cough.    COMPARISON: CT 01/04/2015.    FINDINGS:  Lungs, costophrenic angles clear.  Heart size normal.      Impression:        Normal.    Adaline Sill, MD   01/04/2015 5:48 AM      CT Angio AAA Chest/ Abdomen [[id number]] Collected:  01/04/15 0506    Order Status:  Completed Updated:  01/04/15 0516    Narrative:      History: Pain, diaphoresis, vomiting, hypertension.    Comparison: None.    Technique: Axial CTA chest abdomen with  100 cc Omnipaque 350 intravenous  contrast. 3-D multiplanar MIP reconstructions.    Findings:  Lungs clear.    No pleural collections.    Airways patent.    Thyroid  unremarkable.    No supraclavicular, mediastinal, hilar, or axillary adenopathy.    Heart size normal. No pericardial effusion.     Adequate contrast opacification aorta. Arch normal caliber and  configuration. Celiac, SMA, bilateral renal arteries, accessory left  renal artery, IMA patent. No stenosis, aneurysm, dissection, or filling  defect.    Adequate contrast opacification pulmonary arteries. No evidence of  pulmonary embolism.    Liver, gallbladder, pancreas, spleen, kidneys unremarkable. Nodular  thickening bilateral adrenal glands.    Stomach, duodenum unremarkable. Visualized bowel loops normal caliber.  Normal appendix.  Circumferential bowel wall thickening transverse  colon. No pneumatosis.    No mesenteric, retroperitoneal adenopathy or collection.    No suspicious osseous lesions.      Impression:        Infectious/inflammatory colitis transverse colon.    No cardiovascular abnormalities.    Adaline Sill, MD   01/04/2015 5:12 AM          Critical Care  Performed by: Forest Becker T  Authorized by: Forest Becker T  Total critical care time: 60 minutes  Critical care time was exclusive of separately billable procedures and treating other patients.  Critical care was necessary to treat or prevent imminent or life-threatening deterioration of the following conditions: circulatory failure, CNS failure or compromise and toxidrome.  Critical care was time spent personally by me on the following activities: development of treatment plan with patient or surrogate, discussions with consultants, discussions with primary provider, examination of patient, ordering and performing treatments and interventions, ordering and review of radiographic studies, re-evaluation of patient's condition, review of old charts, ordering and review  of laboratory studies, obtaining history from patient or surrogate and evaluation of patient's response to treatment.        Signed by: Norton Pastel Devra Stare  Date/Time: 01/04/2015 9:07 AM

## 2015-01-04 NOTE — ED Notes (Signed)
Pt reports drinking alcohol around 1830 yesterday and symptoms started few hours after. Not sure if it's related to his drinking.

## 2015-01-04 NOTE — Consults (Addendum)
HEART CARDIOLOGY CONSULTATION REPORT  [facility]    Date Time: 01/04/2015 6:47 PMPatient Name: [name]  Requesting Physician: Jolyn Lent, MD       Reason for Consultation:   Hypertensive emergency      History:   Mike Rice is a 44 y.o. male with history of untreated hypertension admitted from the ER where he presented this morning c/o nausea, vomiting and abdominal pain.  BP in ER was elevated to 240/136 .  He was admitted to ICU and  we have been asked by Dr. Wynelle Bourgeois   to provide cardiac consultation, regarding further evaluation and management.     Mike Rice was diagnosed with hypertension 3-4 years ago.  He took medications for approximately 2 years but stopped two years ago when insurance ran out.  No known history of other cardiac disease.  Denies chest pain, SOB or other symptoms of CHF.      Wife reports that her husband had only 2-3 drinks last night.  Denies drug use although urine positive for opiates.     Was on nicardipine drip - just [place] .  Currently on Precedex drip because of agitation.       Past Medical History:     Past Medical History   Diagnosis Date   . Hypertension        Past Surgical History:   History reviewed. No pertinent past surgical history.    Family History:   History reviewed. No pertinent family history.    Social History:     Social History     Social History   . Marital Status: Married     Spouse Name: N/A   . Number of Children: N/A   . Years of Education: N/A     Social History Main Topics   . Smoking status: Smoker, Current Status Unknown   . Smokeless tobacco: Not on file   . Alcohol Use: No      Comment: Denies   . Drug Use: Not on file   . Sexual Activity: Not on file     Other Topics Concern   . Not on file     Social History Narrative   . No narrative on file       Allergies:   No Known Allergies    Medications:     Prescriptions prior to admission   Medication Sig   . DOXYCYCLINE PO Take by mouth.       Current  Facility-Administered Medications   Medication Dose Route Frequency Provider Last Rate Last Dose   . 0.9%  NaCl infusion   Intravenous Continuous Smirniotopoulos, Norton Pastel, MD 100 mL/hr at 01/04/15 1814     . dexmedetomidine (PRECEDEX) 400 mcg in sodium chloride 0.9% 100 mL infusion (premix)  0.4-1.2 mcg/kg/hr Intravenous Continuous Smirniotopoulos, Norton Pastel, MD 8.5 mL/hr at 01/04/15 1805 0.4 mcg/kg/hr at 01/04/15 1805   . enoxaparin (LOVENOX) syringe 40 mg  40 mg Subcutaneous Daily Jolyn Lent, MD   40 mg at 01/04/15 1412   . hydrALAZINE (APRESOLINE) injection 10 mg  10 mg Intravenous Once PRN Kari Baars, MD       . influenza quadrivalent-split vaccine (PF) (FLUARIX/FLULAVAL/FLUZONE) IM injection 0.5 mL  0.5 mL Intramuscular Once Smirniotopoulos, Norton Pastel, MD   0.5 mL at 01/04/15 1041   . lisinopril (PRINIVIL,ZESTRIL) tablet 5 mg  5 mg Oral Daily Kari Baars, MD   5 mg at 01/04/15 1007   . morphine  injection 6 mg  6 mg Intravenous Q4H PRN Kari Baars, MD   6 mg at 01/04/15 0915   . niCARdipine (CARDENE) 25 mg in sodium chloride 0.9 % 100 mL infusion  5-15 mg/hr Intravenous Continuous Smirniotopoulos, Norton Pastel, MD   Stopped at 01/04/15 1502   . nitroglycerin (NITRO-BID) 2 % ointment            . ondansetron (ZOFRAN) injection 4 mg  4 mg Intravenous 4X Daily PRN Kari Baars, MD       . promethazine (PHENERGAN) injection 12.5 mg  12.5 mg Intravenous Q6H PRN Kari Baars, MD   12.5 mg at 01/04/15 1802   . vancomycin 50 mg/mL oral solution 125 mg  125 mg Oral 4 times per day Smirniotopoulos, Norton Pastel, MD   125 mg at 01/04/15 1733         Review of Systems:    Comprehensive review of systems including constitutional, eyes, ears, nose, mouth, throat, cardiovascular, GI, GU, musculoskeletal, integumentary, respiratory, neurologic, psychiatric, and endocrine is negative other than what is mentioned already in the history of present illness    Physical Exam:     Filed  Vitals:    01/04/15 1815   BP: 171/101   Pulse: 97   Temp:    Resp: 14   SpO2: 99%     Temp (24hrs), Avg:97.7 F (36.5 C), Min:96 F (35.6 C), Max:98.5 F (36.9 C)      Intake and Output Summary (Last 24 hours) at Date Time    Intake/Output Summary (Last 24 hours) at 01/04/15 1847  Last data filed at 01/04/15 1515   Gross per 24 hour   Intake 590.04 ml   Output   1200 ml   Net -609.96 ml       GENERAL: Patient is arousable but very lethargic.  Wife is at bedside.   HEENT: No scleral icterus or conjunctival pallor, moist mucous membranes   NECK: No jugular venous distention or thyromegaly, normal carotid upstrokes without bruits   CARDIAC: Normal apical impulse, regular rate and rhythm, with normal S1 and S2.  Apical S4 present.  No S3.    CHEST: Clear to auscultation bilaterally, normal respiratory effort  ABDOMEN: No abdominal bruits, masses, or hepatosplenomegaly, non tender, non-distended, good bowel sounds   EXTREMITIES: No clubbing, cyanosis, or edema, 2+ DP, PT, and femoral pulses bilaterally without bruits  SKIN: No rash or jaundice   NEUROLOGIC: Alert and oriented to time, place and person.  Very lethargic.  MUSCULOSKELETAL: Normal muscle strength and tone.      Labs Reviewed:       Recent Labs  Lab 01/04/15  1417 01/04/15  0908 01/04/15  0408   CREATINE KINASE (CK) 158 185  --    TROPONIN I <0.01 0.01 0.02               Recent Labs  Lab 01/04/15  0408   BILIRUBIN, TOTAL 0.8   PROTEIN, TOTAL 8.8*   ALBUMIN 4.7   ALT 11   AST (SGOT) 15           Recent Labs  Lab 01/04/15  0408   PT 13.7   PT INR 1.1   PTT 32       Recent Labs  Lab 01/04/15  0408   WBC 7.61   HGB 16.9   HEMATOCRIT 47.2   PLATELETS 261       Recent Labs  Lab 01/04/15  0408  SODIUM 146*   POTASSIUM 3.7   CHLORIDE 109   CO2 20*   BUN 7.0*   CREATININE 1.0   EGFR >60.0   GLUCOSE 127*   CALCIUM 10.3         Radiology   Radiological Procedure reviewed.      chest X-rayExamination: Frontal lateral chest. 01/04/2015:    HISTORY:  Cough.    COMPARISON: CT 01/04/2015.    FINDINGS:  Lungs, costophrenic angles clear.  Heart size normal.    IMPRESSION:     Normal.    Adaline Sill, MD   Assessment:    Malignant hypertension   Noncompliance   Nausea, vomiting, abdominal pain - ? Colitis      Recommendations:    Acute management of hypertensive crisis as you are doing   Echocardiogram - assess for HCVD   Transition to oral antihypertensive regimen when more stable.      Thanks much.  Will follow.            Signed by: Montey Hora, MD    [facility]  NP [employer] [phone] (8am-5pm)  MD Philis Kendall  [phone])  After hours, non urgent consult line [phone]  After Hours, urgent consults [phone]

## 2015-01-04 NOTE — Plan of Care (Signed)
Problem: Safety  Goal: Patient will be free from injury during hospitalization  Outcome: Progressing  Safety precautions in place, hourly rounding completed    Problem: Pain  Goal: Patient's pain/discomfort is manageable  Outcome: Progressing  C/o epigastric pain 8/10. Morphine 6mg  given with adequate effect, pt now asleep and appears comfortable.     Problem: Altered GI Function  Goal: Fluid and electrolyte balance are achieved/maintained  Outcome: Progressing  Goal: Nutritional intake is adequate  Outcome: Not Progressing  Pt with episode of brown emesis x 1. Promethazine given x 1 with good effect.     Problem: Hemodynamic Status: Cardiac  Goal: Stable vital signs and fluid balance  Outcome: Progressing  Admitted to ICU with BP 210s/110s. Started on cardene gtt, titrating for MAP < 110.    Problem: Safe medical management of withdrawal from (list substances of abuse) AS EVIDENCED BY...  Goal: Patient's recovery goal in his/her own words:  Outcome: Not Progressing  Pt denies drug/alcohol use. Urine tox positive for cannabis and opiods.     Comments:   Pt restless/agitated upon admission to unit. Precedex gtt ordered.

## 2015-01-04 NOTE — Plan of Care (Signed)
Labetalol ordered and given with no change in blood pressure reading. Pt asymptomatic bot very diaphoretic.

## 2015-01-04 NOTE — Consults (Signed)
[facility] Neu[facility] Consultation Note                                       Date Time: 01/04/2015 11:53 AM  Patient Name: [name]  Requesting Physician: Jolyn Lent, MD  Date of Admission: 01/04/2015    CC / Reason for Consultation: ? Neurologic cause of malignant hypertension, nausea, vomiting.          Assessment:   1.  MALIGNANT HYPERTENSION: WITHOUT EVIDENCE OF CNS CAUSE. Mental status is normal.  2.  Nausea, vomiting, abdominal pain- not due to Neurologic cause. Likely due to alcohol + doxycycline side effect.  3. Alcohol Use: says he does not drink ETOH regularly. Wife is present in room and does not say anything. He is overtly mildly tremulous - ? Due to  ETOH withdrawal?      Plan:     1. No further Neuro evaluation, tests. Will Sign off this case. Please call prn.      HPI   Mike Rice is a 44 y.o. male who presents to the hospital with - see (1), (2) below.    (1)  NOTE by [facility] ED PHYSICIAN (DR. Herrington) 01/04/15  Chief Complaint: vomiting  Onset: 4 hours ago  Timing: episodic  Location: GI  Severity: moderate  Exacerbating factors: none  Alleviating factors: none  Associated Symptoms: nausea, chills, epigastric abdominal pain, diaphoresis.   Pertinent Negatives: diarrhea, cough, recent travel, chest pain, focal weakness, numbness   Additional History: Mike Rice is a 44 y.o. male presenting to the ED with sudden onset of vomiting for the past 4 hours.   Patient consumed alcohol 10 hours ago and was asymptomatic at that time. Patient was not able to go to sleep tonight, and reports   acute onset of vomiting 4 hours ago. He is diaphoretic and is having associated epigastric abdominal pain, chills, and diaphoresis.   He does not have pertinent past medical history. He is from [place] and visiting family in the area. He is currently taking   Doxycycline daily for acne. He used to be on Lisinopril, but has not taken it for the past 2 years. He  recently had cyst surgically   excised from his chin, and the area is healing well.     (2)  NOTE by [name], MD 01/04/15  ICU Problem List: Malignant hypertension, Altered mental status, GERD and Infectious colitis, enteritis and gastroenteritis and suspected drug use    Assessment:   44 y.o. male presenting to the ED with sudden onset of vomiting for the past 4 hours. Patient consumed alcohol 10 hours ago and was asymptomatic at that time. Patient was not able to go to sleep tonight, and reports acute onset of vomiting 4 hours ago. He is diaphoretic and is having associated epigastric abdominal pain, chills, and diaphoresis. He does not have pertinent past medical history. He is from [place] and visiting family in the area. He is currently taking Doxycycline daily for acne. He used to be on Lisinopril, but has not taken it for the past 2 years. He recently had cyst surgically excised from his chin, and the area is healing well.     He is transferred to ICU with AMS, malignant HTN, and vomiting.          Mr. Mike Rice. denies HA, focal sensory or motor symptoms or any neurological  sx.    Head CT Scan wo contrast negative.            Past Medical Hx     Past Medical History   Diagnosis Date   . Hypertension         Past Surgical Hx:   History reviewed. No pertinent past surgical history.     Family Medical History:    History reviewed. No pertinent family history.    Social Hx     Social History     Social History   . Marital Status: Married     Spouse Name: N/A   . Number of Children: N/A   . Years of Education: N/A     Occupational History   . Not on file.     Social History Main Topics   . Smoking status: Smoker, Current Status Unknown   . Smokeless tobacco: Not on file   . Alcohol Use: No      Comment: Denies   . Drug Use: Not on file   . Sexual Activity: Not on file     Other Topics Concern   . Not on file     Social History Narrative   . No narrative on file       Meds     Home :   Prior to  Admission medications    Medication Sig Start Date End Date Taking? Authorizing Provider   DOXYCYCLINE PO Take by mouth.   Yes [provider]      Inpatient :   Current Facility-Administered Medications   Medication Dose Route Frequency   . influenza  0.5 mL Intramuscular Once   . lisinopril  5 mg Oral Daily   . nitroglycerin       . vancomycin  125 mg Oral 4 times per day         Allergies    Review of patient's allergies indicates no known allergies.      Review of Systems   No constitutional, cardiovascular, respiratory, dermatologic, ENT,   GI, GU, musculoskeletal or hematologic symptoms.    All other systems were reviewed and are negative except for that mentioned in the HPI    Physical Exam:   Temp:  [96 F (35.6 C)-97.9 F (36.6 C)] 97.9 F (36.6 C)  Heart Rate:  [63-93] 93  Resp Rate:  [14-42] 25  BP: (181-240)/(94-136) 181/94 mmHg   General:  Laying in bed, appears slightly shaky or anxious.  Neuro:  Mental Status: alert & oriented x 4;   Speech normal - no dysarthria, aphasia. Affect   appropriate, cognition intact.  CN:  II - XII: grossly intact ; PERLA, EOMFROM;   VF full to confrontation;no facial weakness, gag midline,   shoulder shrug normal; Discs cannot be visualized;  Motor:  Strength 5/5 throughout all extremities.   Normal tone throughout, no involuntary movements; No tremors.  Sensory: NT   Cerebellar:  Finger-to-Finger, Finger-to Nose  normal bilaterally.  Gait: NT  Reflexes:  Unremarkable throughout,   Plantars not tested.        Labs:     Results     Procedure Component Value Units Date/Time    Glucose Whole Blood - POCT [[id number]]  (Abnormal) Collected:  01/04/15 0903     POCT - Glucose Whole blood 120 (H) mg/dL Updated:  65/78/46 9629    Troponin I [[id number]] Collected:  01/04/15 0908    Specimen Information:  Blood Updated:  01/04/15 1004  Troponin I 0.01 ng/mL     Procalcitonin [[id number]] Collected:  01/04/15 0408     Updated:  01/04/15 0959    Creatine Kinase (CK)  [[id number]] Collected:  01/04/15 0908    Specimen Information:  Blood Updated:  01/04/15 0957     Creatine Kinase (CK) 185 U/L     Hemolysis index [[id number]]  (Abnormal) Collected:  01/04/15 0908     Hemolysis Index 258 (H) Updated:  01/04/15 0957    D-Dimer [[id number]] Collected:  01/04/15 0908     D-Dimer 0.30 ug/mL FEU Updated:  01/04/15 0955    Rapid influenza A/B antigens [[id number]] Collected:  01/04/15 0908    Specimen Information:  Nasopharyngeal from Nasal Aspirate Updated:  01/04/15 0955    Narrative:      ORDER#: [id number]                                    ORDERED BY: SMIRNIOTOPOULOS  SOURCE: Nasal Aspirate                               COLLECTED:  01/04/15 09:08  ANTIBIOTICS AT COLL.:                                RECEIVED :  01/04/15 09:33  Influenza Rapid Antigen A&B                FINAL       01/04/15 09:55  01/04/15   Negative for Influenza A and B             Reference Range: Negative      Rapid drug screen, urine [[id number]]  (Abnormal) Collected:  01/04/15 0907    Specimen Information:  Urine Updated:  01/04/15 0953     Amphetamine Screen, UR Negative      Barbiturate Screen, UR Negative      Benzodiazepine Screen, UR Negative      Cannabinoid Screen, UR Positive (A)      Cocaine, UR Negative      Opiate Screen, UR Positive (A)      PCP Screen, UR Negative     MRSA culture [[id number]] Collected:  01/04/15 0925    Specimen Information:  Body Fluid from Nares and Throat Updated:  01/04/15 0926    Blood Culture Aerobic/Anaerobic #1 [[id number]] Collected:  01/04/15 0640    Specimen Information:  Arm from Blood Updated:  01/04/15 0923    Narrative:      1 BLUE+1 PURPLE    Blood Culture Aerobic/Anaerobic #2 [[id number]] Collected:  01/04/15 0640    Specimen Information:  Arm from Blood Updated:  01/04/15 0923    Narrative:      1 BLUE+1 PURPLE    Lactic Acid [[id number]]  (Abnormal) Collected:  01/04/15 0640    Specimen Information:  Blood Updated:  01/04/15 0706     Lactic acid 2.3 (H) mmol/L      Narrative:      REPEAT    Lactic Acid [[id number]]  (Abnormal) Collected:  01/04/15 0627    Specimen Information:  Blood Updated:  01/04/15 2542     Lactic acid 2.7 (H) mmol/L     UA, Reflex to Microscopic (pts 3 + yrs) [[id number]]  (Abnormal) Collected:  01/04/15 0446    Specimen Information:  Urine  Updated:  01/04/15 0507     Urine Type Clean Catch      Color, UA Yellow      Clarity, UA Clear      Specific Gravity UA 1.014      Urine pH 7.0      Leukocyte Esterase, UA Negative      Nitrite, UA Negative      Protein, UR 100 (A)      Glucose, UA 50 (A)      Ketones UA Trace (A)      Urobilinogen, UA Negative mg/dL      Bilirubin, UA Negative      Blood, UA Negative      RBC, UA 0 - 5 /hpf      WBC, UA 0 - 5 /hpf      Squamous Epithelial Cells, Urine 0 - 5 /hpf     Troponin I [[id number]] Collected:  01/04/15 0408    Specimen Information:  Blood Updated:  01/04/15 0447     Troponin I 0.02 ng/mL     Creatinine-Whole Blood [[id number]] Collected:  01/04/15 0430     Creatinine Whole Blood 0.71 mg/dL Updated:  78/29/56 2130    Comprehensive metabolic panel [[id number]]  (Abnormal) Collected:  01/04/15 0408    Specimen Information:  Blood Updated:  01/04/15 0440     Glucose 127 (H) mg/dL      BUN 7.0 (L) mg/dL      Creatinine 1.0 mg/dL      Sodium 295 (H) mEq/L      Potassium 3.7 mEq/L      Chloride 109 mEq/L      CO2 20 (L) mEq/L      Calcium 10.3 mg/dL      Protein, Total 8.8 (H) g/dL      Albumin 4.7 g/dL      AST (SGOT) 15 U/L      ALT 11 U/L      Alkaline Phosphatase 70 U/L      Bilirubin, Total 0.8 mg/dL      Globulin 4.1 (H) g/dL      Albumin/Globulin Ratio 1.1      Anion Gap 17.0 (H)     Lipase [[id number]] Collected:  01/04/15 0408    Specimen Information:  Blood Updated:  01/04/15 0440     Lipase 8 U/L     Hemolysis index [[id number]] Collected:  01/04/15 0408     Hemolysis Index 12 Updated:  01/04/15 0440    GFR [[id number]] Collected:  01/04/15 0408     EGFR >60.0 Updated:  01/04/15 0440    Rapid Influenza A/B  Antigens [[id number]] Collected:  01/04/15 0408    Specimen Information:  Nasopharyngeal from Nasal Aspirate Updated:  01/04/15 0437    Narrative:      ORDER#: [id number]                                    ORDERED BY: Ander Slade, REA  SOURCE: Nasal Aspirate                               COLLECTED:  01/04/15 04:08  ANTIBIOTICS AT COLL.:                                RECEIVED :  01/04/15 04:16  Influenza Rapid  Antigen A&B                FINAL       01/04/15 04:37  01/04/15   Negative for Influenza A and B             Reference Range: Negative      PT/APTT [[id number]] Collected:  01/04/15 0408     PT 13.7 sec Updated:  01/04/15 0429     PT INR 1.1      PT Anticoag. Given Within 48 hrs. None      PTT 32 sec     Glucose Whole Blood - POCT [[id number]]  (Abnormal) Collected:  01/04/15 0425     POCT - Glucose Whole blood 112 (H) mg/dL Updated:  53/66/44 0347    CBC with differential [[id number]]  (Abnormal) Collected:  01/04/15 0408    Specimen Information:  Blood from Blood Updated:  01/04/15 0419     WBC 7.61 x10 3/uL      Hgb 16.9 g/dL      Hematocrit 56.4 %      Platelets 261 x10 3/uL      RBC 4.87 x10 6/uL      MCV 96.9 fL      MCH 34.7 (H) pg      MCHC 35.8 g/dL      RDW 14 %      MPV 10.8 fL      Neutrophils 69 %      Lymphocytes Automated 25 %      Monocytes 5 %      Eosinophils Automated 0 %      Basophils Automated 0 %      Immature Granulocyte 0 %      Nucleated RBC 0 /100 WBC      Neutrophils Absolute 5.26 x10 3/uL      Abs Lymph Automated 1.90 x10 3/uL      Abs Mono Automated 0.38 x10 3/uL      Abs Eos Automated 0.03 x10 3/uL      Absolute Baso Automated 0.04 x10 3/uL      Absolute Immature Granulocyte 0.01 x10 3/uL           Rads:     Results for orders placed or performed during the hospital encounter of 01/04/15   CT Head WO Contrast    Narrative    HISTORY: Hypertensive crisis    EXAMINATION AND TECHNIQUE: Unenhanced CT examination of brain performed.      Note that CT scanning at this site utilizes multiple  dose reduction  techniques including automatic exposure control, adjustment of the MAS  and/or KVP according to patient size, and use of iterative  reconstruction technique.    COMPARISON: None available    FINDINGS: No mass-effect or midline shift on these unenhanced images.  Ventricular size within normal limits. No CT evidence of acute  intra-axial fluid collections to suggest acute hemorrhage. Osseous  structures are intact. The visualized paranasal sinuses exhibit mucosal  thickening in the ethmoidal air cells bilaterally and moderately severe  muscle thickening in the right frontal sinuses suggestive of chronic  sinusitis.       Impression    1. No CT evidence of acute process.  2. 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    Ivin Poot, MD   01/04/2015 9:04 AM               Attending note:     Adonis Housekeeper. Russella Dar,  MD  [facility]  [id number]  [place], [place] [address]    T: [phone]

## 2015-01-04 NOTE — Plan of Care (Signed)
Patient ordered to be transferred to Intensive care. Report given to the receiving nurse.

## 2015-01-04 NOTE — ED Provider Notes (Addendum)
EMERGENCY DEPARTMENT HISTORY AND PHYSICAL EXAM     Physician/Midlevel provider first contact with patient: 01/04/15 0354         Date: 01/04/2015  Patient Name: Mike Rice    History of Presenting Illness     Chief Complaint   Patient presents with   . Emesis   . Chills       History Provided By: Patient    Chief Complaint: vomiting  Onset: 4 hours ago  Timing: episodic  Location: GI  Severity: moderate  Exacerbating factors: none  Alleviating factors: none  Associated Symptoms: nausea, chills, epigastric abdominal pain, diaphoresis.   Pertinent Negatives: diarrhea, cough, recent travel, chest pain, focal weakness, numbness     Additional History: DISHAWN Rice is a 44 y.o. male presenting to the ED with sudden onset of vomiting for the past 4 hours. Patient consumed alcohol 10 hours ago and was asymptomatic at that time. Patient was not able to go to sleep tonight, and reports acute onset of vomiting 4 hours ago. He is diaphoretic and is having associated epigastric abdominal pain, chills, and diaphoresis. He does not have pertinent past medical history. He is from [place] and visiting family in the area. He is currently taking Doxycycline daily for acne. He used to be on Lisinopril, but has not taken it for the past 2 years. He recently had cyst surgically excised from his chin, and the area is healing well.       PCP: No primary care provider on file.  SPECIALISTS:    Current Facility-Administered Medications   Medication Dose Route Frequency Provider Last Rate Last Dose   . ciprofloxacin (CIPRO) 400mg  in D5W IVPB (premix)  400 mg Intravenous Once in ED Perlie Stene, Hoover Browns, MD       . metoclopramide (REGLAN) injection 10 mg  10 mg Intravenous Once Berdella Bacot J, MD       . metroNIDAZOLE (FLAGYL) 500mg  in NS IVPB (premix)  500 mg Intravenous Once Kari Baars, MD         Current Outpatient Prescriptions   Medication Sig Dispense Refill   . DOXYCYCLINE PO Take by  mouth.         Past History     Past Medical History:  Past Medical History   Diagnosis Date   . Hypertension        Past Surgical History:  History reviewed. No pertinent past surgical history.    Family History:  History reviewed. No pertinent family history.    Social History:  Social History   Substance Use Topics   . Smoking status: Unknown If Ever Smoked   . Smokeless tobacco: None   . Alcohol Use: None       Allergies:  No Known Allergies    Review of Systems     Review of Systems   Constitutional: Positive for chills and diaphoresis. Negative for fever.   Respiratory: Negative for cough and shortness of breath.    Cardiovascular: Negative for chest pain.   Gastrointestinal: Positive for nausea, vomiting and abdominal pain.   All other systems reviewed and are negative.        Physical Exam   BP 208/115 mmHg  Pulse 68  Temp(Src) 97.9 F (36.6 C) (Oral)  Resp 26  Ht 6\' 1"  (1.854 m)  Wt 86.183 kg  BMI 25.07 kg/m2  SpO2 99%    Physical Exam   Constitutional: He is oriented to person, place, and time. He  appears well-developed and well-nourished.   Appears uncomfortable   HENT:   Head: Normocephalic and atraumatic.   Eyes: EOM are normal. Pupils are equal, round, and reactive to light.   Neck: Normal range of motion. Neck supple.   Cardiovascular: Normal rate, regular rhythm and normal heart sounds.    Pulmonary/Chest: Effort normal and breath sounds normal. He has no wheezes.   Abdominal: Soft. There is tenderness (Mild diffuse abdominal tenderness).   Musculoskeletal: Normal range of motion.   Normal distal pulses   Neurological: He is alert and oriented to person, place, and time.   Normal sensation   Skin: Skin is warm. He is diaphoretic.   Psychiatric: He has a normal mood and affect. His behavior is normal.   Nursing note and vitals reviewed.      Diagnostic Study Results     Labs -     Results     Procedure Component Value Units Date/Time    Lactic Acid [[id number]] Collected:  01/04/15 0627     Specimen Information:  Blood Updated:  01/04/15 7425    Narrative:      No tourniquet;on ice    UA, Reflex to Microscopic (pts 3 + yrs) [[id number]]  (Abnormal) Collected:  01/04/15 0446    Specimen Information:  Urine Updated:  01/04/15 0507     Urine Type Clean Catch      Color, UA Yellow      Clarity, UA Clear      Specific Gravity UA 1.014      Urine pH 7.0      Leukocyte Esterase, UA Negative      Nitrite, UA Negative      Protein, UR 100 (A)      Glucose, UA 50 (A)      Ketones UA Trace (A)      Urobilinogen, UA Negative mg/dL      Bilirubin, UA Negative      Blood, UA Negative      RBC, UA 0 - 5 /hpf      WBC, UA 0 - 5 /hpf      Squamous Epithelial Cells, Urine 0 - 5 /hpf     Troponin I [[id number]] Collected:  01/04/15 0408    Specimen Information:  Blood Updated:  01/04/15 0447     Troponin I 0.02 ng/mL     Creatinine-Whole Blood [[id number]] Collected:  01/04/15 0430     Creatinine Whole Blood 0.71 mg/dL Updated:  03/22/30 3557    Comprehensive metabolic panel [[id number]]  (Abnormal) Collected:  01/04/15 0408    Specimen Information:  Blood Updated:  01/04/15 0440     Glucose 127 (H) mg/dL      BUN 7.0 (L) mg/dL      Creatinine 1.0 mg/dL      Sodium 062 (H) mEq/L      Potassium 3.7 mEq/L      Chloride 109 mEq/L      CO2 20 (L) mEq/L      Calcium 10.3 mg/dL      Protein, Total 8.8 (H) g/dL      Albumin 4.7 g/dL      AST (SGOT) 15 U/L      ALT 11 U/L      Alkaline Phosphatase 70 U/L      Bilirubin, Total 0.8 mg/dL      Globulin 4.1 (H) g/dL      Albumin/Globulin Ratio 1.1      Anion Gap 17.0 (H)     Lipase [  [id number]] Collected:  01/04/15 0408    Specimen Information:  Blood Updated:  01/04/15 0440     Lipase 8 U/L     Hemolysis index [[id number]] Collected:  01/04/15 0408     Hemolysis Index 12 Updated:  01/04/15 0440    GFR [[id number]] Collected:  01/04/15 0408     EGFR >60.0 Updated:  01/04/15 0440    Rapid Influenza A/B Antigens [[id number]] Collected:  01/04/15 0408    Specimen Information:  Nasopharyngeal  from Nasal Aspirate Updated:  01/04/15 0437    Narrative:      ORDER#: [id number]                                    ORDERED BY: Ander Slade, REA  SOURCE: Nasal Aspirate                               COLLECTED:  01/04/15 04:08  ANTIBIOTICS AT COLL.:                                RECEIVED :  01/04/15 04:16  Influenza Rapid Antigen A&B                FINAL       01/04/15 04:37  01/04/15   Negative for Influenza A and B             Reference Range: Negative      PT/APTT [[id number]] Collected:  01/04/15 0408     PT 13.7 sec Updated:  01/04/15 0429     PT INR 1.1      PT Anticoag. Given Within 48 hrs. None      PTT 32 sec     Glucose Whole Blood - POCT [[id number]]  (Abnormal) Collected:  01/04/15 0425     POCT - Glucose Whole blood 112 (H) mg/dL Updated:  56/43/32 9518    CBC with differential [[id number]]  (Abnormal) Collected:  01/04/15 0408    Specimen Information:  Blood from Blood Updated:  01/04/15 0419     WBC 7.61 x10 3/uL      Hgb 16.9 g/dL      Hematocrit 16.0 %      Platelets 261 x10 3/uL      RBC 4.87 x10 6/uL      MCV 96.9 fL      MCH 34.7 (H) pg      MCHC 35.8 g/dL      RDW 14 %      MPV 10.8 fL      Neutrophils 69 %      Lymphocytes Automated 25 %      Monocytes 5 %      Eosinophils Automated 0 %      Basophils Automated 0 %      Immature Granulocyte 0 %      Nucleated RBC 0 /100 WBC      Neutrophils Absolute 5.26 x10 3/uL      Abs Lymph Automated 1.90 x10 3/uL      Abs Mono Automated 0.38 x10 3/uL      Abs Eos Automated 0.03 x10 3/uL      Absolute Baso Automated 0.04 x10 3/uL      Absolute Immature Granulocyte 0.01 x10 3/uL  Radiologic Studies -   Radiology Results (24 Hour)     Procedure Component Value Units Date/Time    Chest 2 Views [[id number]] Collected:  01/04/15 0547    Order Status:  Completed Updated:  01/04/15 0552    Narrative:      Examination: Frontal lateral chest.    HISTORY: Cough.    COMPARISON: CT 01/04/2015.    FINDINGS:  Lungs, costophrenic angles clear.  Heart size normal.       Impression:        Normal.    Adaline Sill, MD   01/04/2015 5:48 AM      CT Angio AAA Chest/ Abdomen [[id number]] Collected:  01/04/15 0506    Order Status:  Completed Updated:  01/04/15 0516    Narrative:      History: Pain, diaphoresis, vomiting, hypertension.    Comparison: None.    Technique: Axial CTA chest abdomen with 100 cc Omnipaque 350 intravenous  contrast. 3-D multiplanar MIP reconstructions.    Findings:  Lungs clear.    No pleural collections.    Airways patent.    Thyroid unremarkable.    No supraclavicular, mediastinal, hilar, or axillary adenopathy.    Heart size normal. No pericardial effusion.     Adequate contrast opacification aorta. Arch normal caliber and  configuration. Celiac, SMA, bilateral renal arteries, accessory left  renal artery, IMA patent. No stenosis, aneurysm, dissection, or filling  defect.    Adequate contrast opacification pulmonary arteries. No evidence of  pulmonary embolism.    Liver, gallbladder, pancreas, spleen, kidneys unremarkable. Nodular  thickening bilateral adrenal glands.    Stomach, duodenum unremarkable. Visualized bowel loops normal caliber.  Normal appendix.  Circumferential bowel wall thickening transverse  colon. No pneumatosis.    No mesenteric, retroperitoneal adenopathy or collection.    No suspicious osseous lesions.      Impression:        Infectious/inflammatory colitis transverse colon.    No cardiovascular abnormalities.    Adaline Sill, MD   01/04/2015 5:12 AM        .    Medical Decision Making   I am the first provider for this patient.    I reviewed the vital signs, available nursing notes, past medical history, past surgical history, family history and social history.    Vital Signs-Reviewed the patient's vital signs.     Patient Vitals for the past 12 hrs:   BP Temp Pulse Resp   01/04/15 0602 (!) 208/115 mmHg 97.9 F (36.6 C) 68 (!) 26   01/04/15 0515 (!) 236/119 mmHg - 63 (!) 24   01/04/15 0418 (!) 215/136 mmHg - 70 (!) 24   01/04/15 0403 -  97.5 F (36.4 C) - -   01/04/15 0351 (!) 240/133 mmHg - 71 (!) 24       Pulse Oximetry Analysis - Normal 100% on RA    EKG:  Interpreted by the EP.   Time Interpreted: 0358   Rate: 65 bpm   Rhythm: NSR   Interpretation: No acute ischemia   Comparison: No prior EKG available for comparison    Old Medical Records: Nursing notes.     ED Course:     32. Respiratory is aware of STAT creatinine level. Patient is actively vomiting; will order Zofran and reassess.     2952. Patient is actively vomiting; repeat blood pressure is 236/119. Will order Hydralazine injection.     8413. Re-evaluated patient, who continues to experience symptoms, but says his  nausea has improved. He is attempting PO intake with water. His blood pressure is still elevated. I will order anti-emetic and pain medication. I discussed CT scan findings with patient and wife. I explained treatment plan options. He agrees with admission into hospital.     0607. D/w Dr. Wyn Quaker, [facility] hospitalist on call. Requests for lactic acid level. Accepts admission.     1610. Patient is receiving Reglan IV.     [id number]. Updated Dr. Wyn Quaker regarding elevated lactic acid level. She will f/u. Will give additional fluid.     6:44 AM - I suspect that this patient has an active infection.     PROVIDER SEPSIS PHYSICAL EXAM RE-EVAL   Vital signs reviewed:   Temp: (!) 96 F (35.6 C), Heart Rate: 78, Resp Rate: 16, BP: (!) 206/105 mmHg  Cardiac exam:  Regular rate  Pulmonary exam:  Normal  Peripheral pulses:  Normal  Capillary refill:  Normal  Skin exam (Must comment on color. CMS examples include   "flushed," "mottled," "pale," "pallor," "pink"): pale  ?Exam done by Dr. Catalina Pizza on 01/04/15 at [phone]    [phone]. D/w Dr. Elmon Else, gen surg. He agrees to consult on patient's case.     Provider Notes:   45 y.o. M with epigastric abd pain, n/v, and diaphoresis. Symptoms started 4 hours ago. Pt complains more of nausea than pain, but states he feels uncomfortable. CT shows  colitis of transverse colon. Discussed differential, treatment, recommendations for admission. He and his wife agree with plan. Cultures, abx ordered.     Dr. Ander Slade is the primary emergency physician of record.        Diagnosis     Clinical Impression:   1. Colitis    2. Hypertensive urgency    3. Intractable vomiting with nausea, unspecified vomiting type        Treatment Plan:   ED Disposition     Admit Admitting Physician: Shelba Flake [[id number]]  Diagnosis: Colitis [[id number]]  Estimated Length of Stay: > or = to 2 midnights  Tentative Discharge Plan?: Home or Self Care [1]  Patient Class: Inpatient [101]  I certify that inpatient services are medically necessary for this patient. Please see H&P and MD progress notes for additional information about the patient's course of treatment. For Medicare patients, services provided in accordance with 412.3 and expected LOS to be greater than 2 midnights for Medicare patients.: Yes              _______________________________      Attestations: This note is prepared by Leonia Reader, acting as scribe for Dr. Catalina Pizza, MD. The scribe's documentation has been prepared under my direction and personally reviewed by me in its entirety.  I confirm that the note above accurately reflects all work, treatment, procedures, and medical decision making performed by me.    _______________________________      Kari Baars, MD  01/04/15 6213    Kari Baars, MD  01/04/15 [phone]

## 2015-01-04 NOTE — ED Notes (Signed)
Mike Rice is a 44 y.o. male presenting to the ED with sudden onset emesis and chills at 0000. Patient arrived diaphoretic and uncomfortable. Patient began vomiting. Order for Zofran requested by RN. BP 240/133 mmHg  Pulse 71  Resp 24  Ht 6\' 1"  (1.854 m)  Wt 86.183 kg  BMI 25.07 kg/m2  SpO2 100%

## 2015-01-04 NOTE — Plan of Care (Signed)
Patient received from the emergency room with a blood pressure of 222/128 upon arrival to 2528. Patient also vomited once on the unit, a litre of light pink fluid.oriented to the unit, Dr Wynelle Bourgeois made aware.

## 2015-01-05 ENCOUNTER — Inpatient Hospital Stay: Payer: No Typology Code available for payment source

## 2015-01-05 DIAGNOSIS — N179 Acute kidney failure, unspecified: Secondary | ICD-10-CM | POA: Diagnosis present

## 2015-01-05 LAB — CBC AND DIFFERENTIAL
Basophils Absolute Automated: 0.01 10*3/uL (ref 0.00–0.20)
Basophils Automated: 0 %
Eosinophils Absolute Automated: 0.04 10*3/uL (ref 0.00–0.70)
Eosinophils Automated: 0 %
Hematocrit: 38.5 % — ABNORMAL LOW (ref 42.0–52.0)
Hgb: 13.1 g/dL (ref 13.0–17.0)
Immature Granulocytes Absolute: 0.03 10*3/uL
Immature Granulocytes: 0 %
Lymphocytes Absolute Automated: 3.33 10*3/uL (ref 0.50–4.40)
Lymphocytes Automated: 28 %
MCH: 33.2 pg — ABNORMAL HIGH (ref 28.0–32.0)
MCHC: 34 g/dL (ref 32.0–36.0)
MCV: 97.5 fL (ref 80.0–100.0)
MPV: 10.3 fL (ref 9.4–12.3)
Monocytes Absolute Automated: 0.95 10*3/uL (ref 0.00–1.20)
Monocytes: 8 %
Neutrophils Absolute: 7.45 10*3/uL (ref 1.80–8.10)
Neutrophils: 63 %
Nucleated RBC: 0 /100 WBC (ref 0–1)
Platelets: 240 10*3/uL (ref 140–400)
RBC: 3.95 10*6/uL — ABNORMAL LOW (ref 4.70–6.00)
RDW: 14 % (ref 12–15)
WBC: 11.78 10*3/uL — ABNORMAL HIGH (ref 3.50–10.80)

## 2015-01-05 LAB — BASIC METABOLIC PANEL
Anion Gap: 10 (ref 5.0–15.0)
BUN: 21 mg/dL (ref 9.0–28.0)
CO2: 20 mEq/L — ABNORMAL LOW (ref 22–29)
Calcium: 8.8 mg/dL (ref 8.5–10.5)
Chloride: 109 mEq/L (ref 100–111)
Creatinine: 1.1 mg/dL (ref 0.7–1.3)
Glucose: 104 mg/dL — ABNORMAL HIGH (ref 70–100)
Potassium: 3.8 mEq/L (ref 3.5–5.1)
Sodium: 139 mEq/L (ref 136–145)

## 2015-01-05 LAB — COMPREHENSIVE METABOLIC PANEL
ALT: 11 U/L (ref 0–55)
AST (SGOT): 14 U/L (ref 5–34)
Albumin/Globulin Ratio: 1.1 (ref 0.9–2.2)
Albumin: 3.6 g/dL (ref 3.5–5.0)
Alkaline Phosphatase: 54 U/L (ref 38–106)
Anion Gap: 9 (ref 5.0–15.0)
BUN: 26 mg/dL (ref 9.0–28.0)
Bilirubin, Total: 1.2 mg/dL (ref 0.2–1.2)
CO2: 24 mEq/L (ref 22–29)
Calcium: 8.9 mg/dL (ref 8.5–10.5)
Chloride: 106 mEq/L (ref 100–111)
Creatinine: 2 mg/dL — ABNORMAL HIGH (ref 0.7–1.3)
Globulin: 3.2 g/dL (ref 2.0–3.6)
Glucose: 106 mg/dL — ABNORMAL HIGH (ref 70–100)
Potassium: 3.5 mEq/L (ref 3.5–5.1)
Protein, Total: 6.8 g/dL (ref 6.0–8.3)
Sodium: 139 mEq/L (ref 136–145)

## 2015-01-05 LAB — TSH: TSH: 0.54 u[IU]/mL (ref 0.35–4.94)

## 2015-01-05 LAB — GFR
EGFR: 43.8
EGFR: >60

## 2015-01-05 LAB — LACTIC ACID, PLASMA: Lactic Acid: 1.4 mmol/L (ref 0.2–2.0)

## 2015-01-05 LAB — TROPONIN I: Troponin I: 0.02 ng/mL (ref 0.00–0.09)

## 2015-01-05 LAB — HEMOLYSIS INDEX
Hemolysis Index: 19 — ABNORMAL HIGH (ref 0–18)
Hemolysis Index: 22 — ABNORMAL HIGH (ref 0–18)

## 2015-01-05 LAB — URIC ACID: Uric acid: 5 mg/dL (ref 3.6–9.7)

## 2015-01-05 LAB — PROCALCITONIN: Procalcitonin: <0.1 (ref 0.0–0.1)

## 2015-01-05 LAB — T3, FREE: T3, Free: 2.33 pg/mL (ref 1.71–3.71)

## 2015-01-05 MED ORDER — LABETALOL HCL 200 MG PO TABS
100.0000 mg | ORAL_TABLET | Freq: Two times a day (BID) | ORAL | Status: AC
Start: 2015-01-05 — End: 2015-02-04

## 2015-01-05 MED ORDER — LABETALOL HCL 200 MG PO TABS
200.0000 mg | ORAL_TABLET | Freq: Two times a day (BID) | ORAL | Status: DC
Start: 2015-01-05 — End: 2015-01-05

## 2015-01-05 MED ORDER — AMLODIPINE BESYLATE 10 MG PO TABS
10.0000 mg | ORAL_TABLET | Freq: Every day | ORAL | Status: AC
Start: 2015-01-05 — End: 2015-02-04

## 2015-01-05 MED ORDER — METRONIDAZOLE 250 MG PO TABS
500.0000 mg | ORAL_TABLET | Freq: Three times a day (TID) | ORAL | Status: DC
Start: 2015-01-05 — End: 2015-01-05
  Administered 2015-01-05: 500 mg via ORAL
  Filled 2015-01-05: qty 2

## 2015-01-05 MED ORDER — CIPROFLOXACIN HCL 500 MG PO TABS
500.0000 mg | ORAL_TABLET | Freq: Two times a day (BID) | ORAL | Status: DC
Start: 2015-01-05 — End: 2015-01-05
  Administered 2015-01-05: 500 mg via ORAL
  Filled 2015-01-05: qty 1

## 2015-01-05 MED ORDER — POTASSIUM CHLORIDE CRYS ER 20 MEQ PO TBCR
40.0000 meq | EXTENDED_RELEASE_TABLET | Freq: Once | ORAL | Status: AC
Start: 2015-01-05 — End: 2015-01-05
  Administered 2015-01-05: 40 meq via ORAL
  Filled 2015-01-05: qty 2

## 2015-01-05 MED ORDER — LORAZEPAM 2 MG/ML IJ SOLN
0.5000 mg | INTRAMUSCULAR | Status: DC | PRN
Start: 2015-01-05 — End: 2015-01-05

## 2015-01-05 MED ORDER — METRONIDAZOLE 500 MG PO TABS
500.0000 mg | ORAL_TABLET | Freq: Three times a day (TID) | ORAL | Status: AC
Start: 2015-01-05 — End: 2015-01-10

## 2015-01-05 MED ORDER — CIPROFLOXACIN HCL 500 MG PO TABS
500.0000 mg | ORAL_TABLET | Freq: Two times a day (BID) | ORAL | Status: AC
Start: 2015-01-05 — End: 2015-01-10

## 2015-01-05 MED ORDER — AMLODIPINE BESYLATE 5 MG PO TABS
10.0000 mg | ORAL_TABLET | Freq: Every day | ORAL | Status: DC
Start: 2015-01-05 — End: 2015-01-05
  Administered 2015-01-05: 10 mg via ORAL
  Filled 2015-01-05: qty 2

## 2015-01-05 NOTE — Progress Notes (Signed)
ICU Daily Progress Note        Date Time: 01/05/2015 8:28 AM  Patient Name: Mike Rice  Attending Physician: Jolyn Lent, MD  Room: [phone]   Admit Date: 01/04/2015  LOS: 1 day       ICU problem list: malignant HTN, colitis, tremors    Assessment:   Malignant HTN  AKI  Tremors  Colitis      Plan:   Change lisinopril to labetalol  Creatinine 2.0, cont ivf and stop lisinoptil  Patient reports always has tremors, will check thyroid  Improved will start low fiber diet.  Patient is not nauseated    Transfer to IMCU  Code Status: full code    Subjective:   Review of Systems - no ha, no change in vision, no chest pain, no n/v no diarrhea, no abd pain.  No numbness, no tingling, no confusion. Off precedex    Medications:      Scheduled Meds: PRN Meds:      enoxaparin 40 mg Subcutaneous Daily   influenza 0.5 mL Intramuscular Once   labetalol 200 mg Oral Q12H SCH   potassium chloride 40 mEq Oral Once   vancomycin 125 mg Oral 4 times per day       Continuous Infusions:  . sodium chloride 100 mL/hr at 01/05/15 0800      hydrALAZINE 10 mg Once PRN   morphine 6 mg Q4H PRN   ondansetron 4 mg 4X Daily PRN         Physical Exam:   Capillary refill = , 3 seconds  General Appearance:  alert, well appearing, and in no distress  Neuro:  cranial nerves II through XII intact, hand tremors bilaterally  Neck:supple, no significant adenopathy  Lungs: clear to auscultation, no wheezes, rales or rhonchi, symmetric air entry  Cardiac: normal rate, regular rhythm, normal S1, S2, no murmurs, rubs, clicks or gallops  Abdomen:  soft, nontender, nondistended, no masses or organomegaly  Extremities: peripheral pulses normal, no pedal edema, no clubbing or cyanosis   Skin:   normal coloration and turgor, no rashes, no suspicious skin lesions noted  GU:    voids  Data:   IBW/kg (Calculated) : 79.9    Vent Settings:    IBW:      Patient Lines/Drains/Airways Status    Active PICC Line / CVC Line / PIV Line / Drain / Airway /  Intraosseous Line / Epidural Line / ART Line / Line / Wound / Pressure Ulcer / NG/OG Tube     Name:   Placement date:   Placement time:   Site:   Days:    Peripheral IV 01/04/15 Right Upper Arm  01/04/15   1000   Upper Arm   less than 1                VITAL SIGNS   Temp:  [97.9 F (36.6 C)-98.7 F (37.1 C)] 98.5 F (36.9 C)  Heart Rate:  [58-122] 67  Resp Rate:  [14-48] 26  BP: (94-216)/(50-125) 119/75 mmHg  Blood Glucose:  Pulse ox:  Telemetry:sinus       Intake/Output Summary (Last 24 hours) at 01/05/15 0828  Last data filed at 01/05/15 0800   Gross per 24 hour   Intake 2100.99 ml   Output   1200 ml   Net 900.99 ml        Labs:     CBC w/Diff CMP     Recent Labs  Lab 01/05/15  0316  01/04/15  0408   WBC 11.78* 7.61   HGB 13.1 16.9   HEMATOCRIT 38.5* 47.2   PLATELETS 240 261   MCV 97.5 96.9   NEUTROPHILS 63 69          PT/INR     Recent Labs  Lab 01/04/15  0408   PT INR 1.1         Recent Labs  Lab 01/05/15  0316 01/04/15  0408   SODIUM 139 146*   POTASSIUM 3.5 3.7   CHLORIDE 106 109   CO2 24 20*   BUN 26.0 7.0*   CREATININE 2.0* 1.0   GLUCOSE 106* 127*   CALCIUM 8.9 10.3   PROTEIN, TOTAL 6.8 8.8*   ALBUMIN 3.6 4.7   AST (SGOT) 14 15   ALT 11 11   ALKALINE PHOSPHATASE 54 70   BILIRUBIN, TOTAL 1.2 0.8       Recent Labs  Lab 01/05/15  0316 01/04/15  1956 01/04/15  1417 01/04/15  0908   CREATINE KINASE (CK)  --  217 158 185   TROPONIN I 0.02  --  <0.01 0.01       @lababg @   Glucose POCT     Recent Labs  Lab 01/05/15  0316 01/04/15  0408   GLUCOSE 106* 127*        Rads:   Radiological Imaging personally reviewed.    I have personally reviewed the patient's history and 24 hour interval events, along with vitals, labs, radiology images.     So far today I have spent 35 minutes providing care for this patient excluding teaching and billable procedures, and not overlapping with any other providers.    Signed by: Esmond Camper, ACNP-BC  Date/Time: 01/05/2015 8:28 AM

## 2015-01-05 NOTE — Discharge Summary (Signed)
Discharge Summary    Date:01/05/2015   Patient Name: Mike Rice  Attending Physician: Jolyn Lent, MD    Date of Admission:   01/04/2015    Date of Discharge:   01/05/2015    Admitting Diagnosis:   Hypertensive Urgency   AMS   Colitis     Discharge Dx:     Principal Diagnosis (Diagnosis after study, that is chiefly responsible for admission to inpatient status): Hypertensive urgency  Active Hospital Problems    Diagnosis POA   . AKI (acute kidney injury) Yes   . Colitis Yes   . Hypertension Yes   . Gastroesophageal reflux disease with esophagitis Yes      Resolved Hospital Problems    Diagnosis POA   . Principal Problem: Hypertensive urgency Yes   . Altered mental status: noted upon admission in notes, but Neuro exam is normal. Yes       Treatment Team:   Treatment Team:   Attending Provider: Jolyn Lent, MD  Consulting Physician: Smirniotopoulos, Norton Pastel, MD     Procedures performed:   Radiology: all results from this admission  Chest 2 Views    01/04/2015  Normal. Adaline Sill, MD 01/04/2015 5:48 AM     Ct Head Wo Contrast    01/04/2015  1. No CT evidence of acute process. 2. 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 Ivin Poot, MD 01/04/2015 9:04 AM     Xr Chest Ap Portable    01/05/2015  No acute cardiopulmonary disease. Fonnie Mu, MD 01/05/2015 9:12 AM     Ct Angio Aaa Chest/ Abdomen    01/04/2015  Infectious/inflammatory colitis transverse colon. No cardiovascular abnormalities. Adaline Sill, MD 01/04/2015 5:12 AM       Reason for Admission:   Hypertensive Urgency   Colitis   Nausea, vomiting     Hospital Course:     44 year old male with H/O HTN non compliant with medication presented with hypertensive urgency, nausea, vomiting, found to have colitis   Patient was transferred to ICU for management of of the hypertensive urgency, condition improved and transferred to [facility] after 24 hours   Started on flagyl and cipro for colitis   Oral  antihypertensive medication   Patient developed AKI due to dehydration likely Pre-renal resolved     Condition at Discharge:   Stable     Today:     BP 127/85 mmHg  Pulse 66  Temp(Src) 98.5 F (36.9 C) (Oral)  Resp 26  Ht 1.854 m (6\' 1" )  Wt 85.8 kg (189 lb 2.5 oz)  BMI 24.96 kg/m2  SpO2 100%  Ranges for the last 24 hours:  Temp:  [98.3 F (36.8 C)-98.7 F (37.1 C)] 98.5 F (36.9 C)  Heart Rate:  [58-114] 66  Resp Rate:  [14-36] 26  BP: (94-171)/(50-102) 127/85 mmHg    Last set of labs     Recent Labs  Lab 01/05/15  0316   WBC 11.78*   HGB 13.1   HEMATOCRIT 38.5*   PLATELETS 240       Recent Labs  Lab 01/05/15  1437   SODIUM 139   POTASSIUM 3.8   CHLORIDE 109   CO2 20*   BUN 21.0   CREATININE 1.1   EGFR >60.0   GLUCOSE 104*   CALCIUM 8.8       Recent Labs  Lab 01/05/15  0311   THYROID STIMULATING HORMONE 0.54   T3, FREE 2.33  Discharge Instructions For Providers     1. Patient will need Colonoscopy in 1 month           Discharge Instructions:     Follow-up Information     Follow up with Primary care doctor in [place]  In 1 week.        Disposition:    Home / self care      Discharge Medication List      Taking          amLODIPine 10 MG tablet   Dose:  10 mg   Commonly known as:  NORVASC   Take 1 tablet (10 mg total) by mouth daily.       ciprofloxacin 500 MG tablet   Dose:  500 mg   Commonly known as:  CIPRO   Take 1 tablet (500 mg total) by mouth every 12 (twelve) hours.       labetalol 200 MG tablet   Dose:  100 mg   Commonly known as:  NORMODYNE   Take 0.5 tablets (100 mg total) by mouth every 12 (twelve) hours.       metroNIDAZOLE 500 MG tablet   Dose:  500 mg   Commonly known as:  FLAGYL   Take 1 tablet (500 mg total) by mouth every 8 (eight) hours.         STOP taking these medications          DOXYCYCLINE PO           Minutes spent coordinating discharge and reviewing discharge plan: 35  minutes      Signed by: Jolyn Lent, MD

## 2015-01-05 NOTE — Progress Notes (Signed)
[facility]  PROGRESS NOTE      Patient: Mike Rice  Date: 01/05/2015   LOS: 1 Days  Admission Date: 01/04/2015   MRN: [id number]  Attending: Jolyn Lent  Please contact me on the following Spectralink [phone]       ASSESSMENT/PLAN     Mike Rice is a 44 y.o. male admitted with Hypertensive urgency    Interval Summary:   44 year old male with H/O HTN non compliant with medication presented with hypertensive urgency, nausea, vomiting, found to have colitis   Patient was transferred to ICU for management of of the hypertensive urgency, condition improved and transferred to [facility] after 24 hours   Started on flagyl and cipro for colitis   Oral antihypertensive medication   Patient developed AKI     Patient Active Hospital Problem List:   Hypertensive urgency (01/04/2015) likely due to non compliance with medication or drug abuse     Assessment: acute resolved     Plan: started on oral medication for BP \   advised compliance    Echo reviewed --> no hypertrophy      Colitis (01/04/2015)    Assessment: acute symptoms resolved     Plan: continue with flagyl and cipro for 5 days     Colonoscopy as outpatient      AKI (acute kidney injury) (01/05/2015) resolved     Assessment: acute likely pre-renal due to dehydration     Plan: f/u repeated     F/U UA     Analgesia:  Non needed     Nutrition: regular diet     DVT Prophylaxis: Lovenox        Code Status: Full code     DISPO: home        SUBJECTIVE     Mike Rice states that he is feeling much better     MEDICATIONS     Current Facility-Administered Medications   Medication Dose Route Frequency   . amLODIPine  10 mg Oral Daily   . ciprofloxacin  500 mg Oral Q12H SCH   . enoxaparin  40 mg Subcutaneous Daily   . influenza  0.5 mL Intramuscular Once   . labetalol  200 mg Oral Q12H SCH   . metroNIDAZOLE  500 mg Oral Q8H SCH       PHYSICAL EXAM     Filed Vitals:    01/05/15 1000   BP: 127/85   Pulse: 66   Temp:    Resp:    SpO2: 100%        Temperature: Temp  Min: 98.3 F (36.8 C)  Max: 98.7 F (37.1 C)  Pulse: Pulse  Min: 58  Max: 114  Respiratory: Resp  Min: 14  Max: 36  Non-Invasive BP: BP  Min: 94/55  Max: 171/101  Pulse Oximetry SpO2  Min: 95 %  Max: 100 %    Intake and Output Summary (Last 24 hours) at Date Time    Intake/Output Summary (Last 24 hours) at 01/05/15 1450  Last data filed at 01/05/15 1352   Gross per 24 hour   Intake 2176.85 ml   Output    850 ml   Net 1326.85 ml         GEN APPEARANCE: Normal;  A&OX3  HEENT: PERLA; EOMI; Conjunctiva Clear  NECK: Supple; No bruits  CVS: RRR, S1, S2; No M/G/R  LUNGS: CTAB; No Wheezes; No Rhonchi: No rales  ABD: Soft; No TTP; + Normoactive BS  EXT: No edema; Pulses 2+ and intact  Skin exam:  pink  NEURO: CN 2-12 intact; No Focal neurological deficits  CAP REFILL:  Normal  MENTAL STATUS:  Normal    Exam done by Jolyn Lent, MD on 01/05/2015 at 2:50 PM      LABS       Recent Labs  Lab 01/05/15  0316 01/04/15  0408   WBC 11.78* 7.61   RBC 3.95* 4.87   HGB 13.1 16.9   HEMATOCRIT 38.5* 47.2   MCV 97.5 96.9   PLATELETS 240 261         Recent Labs  Lab 01/05/15  0316 01/04/15  0408   SODIUM 139 146*   POTASSIUM 3.5 3.7   CHLORIDE 106 109   CO2 24 20*   BUN 26.0 7.0*   CREATININE 2.0* 1.0   GLUCOSE 106* 127*   CALCIUM 8.9 10.3         Recent Labs  Lab 01/05/15  0316 01/04/15  0408   ALT 11 11   AST (SGOT) 14 15   BILIRUBIN, TOTAL 1.2 0.8   ALBUMIN 3.6 4.7   ALKALINE PHOSPHATASE 54 70         Recent Labs  Lab 01/05/15  0316 01/04/15  1956 01/04/15  1417 01/04/15  0908   CREATINE KINASE (CK)  --  217 158 185   TROPONIN I 0.02  --  <0.01 0.01         Recent Labs  Lab 01/04/15  0408   PT INR 1.1   PT 13.7   PTT 32       Microbiology Results     Procedure Component Value Units Date/Time    Blood Culture Aerobic/Anaerobic #1 [[id number]] Collected:  01/04/15 0640    Specimen Information:  Arm from Blood Updated:  01/05/15 1021    Narrative:      ORDER#: [id number]                                     ORDERED BY: HERRINGTON, REA  SOURCE: Blood Arm                                    COLLECTED:  01/04/15 06:40  ANTIBIOTICS AT COLL.:                                RECEIVED :  01/04/15 09:23  Culture Blood Aerobic and Anaerobic        PRELIM      01/05/15 10:21  01/05/15   No Growth after 1 day/s of incubation.      Blood Culture Aerobic/Anaerobic #2 [[id number]] Collected:  01/04/15 0640    Specimen Information:  Arm from Blood Updated:  01/05/15 1021    Narrative:      ORDER#: [id number]                                    ORDERED BY: HERRINGTON, REA  SOURCE: Blood Arm                                    COLLECTED:  01/04/15 06:40  ANTIBIOTICS AT COLL.:                                RECEIVED :  01/04/15 09:23  Culture Blood Aerobic and Anaerobic        PRELIM      01/05/15 10:21  01/05/15   No Growth after 1 day/s of incubation.      MRSA culture [[id number]] Collected:  01/04/15 4098    Specimen Information:  Body Fluid from Nares and Throat Updated:  01/04/15 1511    Rapid Influenza A/B Antigens [[id number]] Collected:  01/04/15 0408    Specimen Information:  Nasopharyngeal from Nasal Aspirate Updated:  01/04/15 0437    Narrative:      ORDER#: [id number]                                    ORDERED BY: Ander Slade, REA  SOURCE: Nasal Aspirate                               COLLECTED:  01/04/15 04:08  ANTIBIOTICS AT COLL.:                                RECEIVED :  01/04/15 04:16  Influenza Rapid Antigen A&B                FINAL       01/04/15 04:37  01/04/15   Negative for Influenza A and B             Reference Range: Negative      Rapid influenza A/B antigens [[id number]] Collected:  01/04/15 0908    Specimen Information:  Nasopharyngeal from Nasal Aspirate Updated:  01/04/15 0955    Narrative:      ORDER#: [id number]                                    ORDERED BY: SMIRNIOTOPOULOS  SOURCE: Nasal Aspirate                               COLLECTED:  01/04/15 09:08  ANTIBIOTICS AT COLL.:                                RECEIVED :   01/04/15 09:33  Influenza Rapid Antigen A&B                FINAL       01/04/15 09:55  01/04/15   Negative for Influenza A and B             Reference Range: Negative             RADIOLOGY     Reviewed     SignedJolinda Croak Zolton Dowson  2:50 PM 01/05/2015

## 2015-01-05 NOTE — Plan of Care (Signed)
Ask3Teach3 Program    Education about New Medications and their Side effects    Dear Mike Rice,    Its been a pleasure taking care of you during your hospitalization here at [facility] We have initiated a new program to educate our patients and/or their family members or designated personnel about the new medications started by your physicians and their indications along with the possible side effects. Multiple studies have shown that patients started on new medications are often unaware of the names of the medication along with the indications and their side effects which leads to decreased compliance with the medications.    During our conversation today on 01/05/2015  3:21 PM I have explained to you the name of the new medication and the indication along with some possible common side effects. Listed below are some of the new medications started during this hospitalization.     Please call the Nurse if you have any side effects while in hospital.     Please call 911 if you have any life threatening symptoms after you are discharged from the hospital.    Please inform your Primary care physician for common side effects which are not life threatening after discharge.    Medication:  Amlodipine(Norvasc)   This Medication is used for:   High Blood Pressure   Angina     Common Side Effects are:   Headache   Dizziness   Nausea   Swelling     A note from your nurse:  Call your nurse immediately if you notice itching, hives, swelling or trouble breathing       Medication Name: Ciprofloxacin(Cipro)   This Medication is used for:   Bacterial Infections    Common Side Effects are:   Nausea   Upset Stomach   Diarrhea    A note from your nurse:  Call your nurse immediately if you notice itching, hives, swelling or trouble breathing     Medication: Labetalol(Normodyne)   This Medication is used for:   High Blood Pressure     Common Side Effects are:   Dizziness/Low Blood Pressure     Worsening of Shortness of Breath with Asthma or COPD   Fatigue    Change in Heart Rhythm (Heart Block)     A note from your nurse:  Call your nurse immediately if you notice itching, hives, swelling or trouble breathing       Medication: Metronidazole(Flagyl)   This Medication is used for:   Bacterial Infections    Common Side Effects are:   Metallic taste   Abdominal pain   Yeast infection    A note from your nurse:  Call your nurse immediately if you notice itching, hives, swelling or trouble breathing         Thank you for your time.    Sheran Spine, RN  01/05/2015  3:21 PM  [facility] [place]  [address]  [place], [place]  [address]

## 2015-01-05 NOTE — Progress Notes (Signed)
Pt transferred to room 2511B on monitor via bed with all belongings. Wife present at bedside. Report given to Maralyn Sago, RN. IVF @ 100cc/hr. Vitals stable at this time.

## 2015-01-05 NOTE — Plan of Care (Addendum)
Problem: Pain  Goal: Patient's pain/discomfort is manageable  Outcome: Progressing  Pt denies pain at this time, appears comfortable, continue to monitor     Problem: Nausea/Vomitting  Goal: Fluid and electrolyte balance are achieved/maintained  Outcome: Progressing  Denies n/v. Tolerated regular diet this morning.     Problem: Safe medical management of withdrawal from (list substances of abuse) AS EVIDENCED BY...  Goal: Patient's recovery goal in his/her own words:  Outcome: Progressing  Pt off precedex since 0800. Alert, calm, cooperative with care. Still somewhat tremulous, pt reports this is baseline. Pt reports only social drinking.       Off cardene gtt since 1500 yesterday. BP 100s-120s. HR 60s-70s. Started on PO labetolol for BP control, first dose held this AM d/t HR 60 and BP WNL.

## 2015-01-05 NOTE — UM Notes (Signed)
[facility]    ADMISSION   ICU Problem List: Malignant hypertension, Altered mental status, GERD and Infectious colitis, enteritis and gastroenteritis and suspected drug use    44 y.o. male presenting to the ED with sudden onset of vomiting for the past 4 hours. Patient consumed alcohol 10 hours ago and was asymptomatic at that time. Patient was not able to go to sleep tonight, and reports acute onset of vomiting 4 hours ago. He is diaphoretic and is having associated epigastric abdominal pain, chills, and diaphoresis.   He is transferred to ICU with AMS, malignant HTN, and vomiting.  IV Cardene. Urine toxicology screen. IV antiemetics. Oral Vancomycin.        01/04/15 ED  PMH Hypertension    VS  97.49F  --  71  100 %  --   24   240/133 mmHg     236/119 mmHg 227/122 mmHg    LABS  Mch 34.7   Glucose 127 (H)    BUN 7.0 (L)    Sodium 146 (H)    CO2 20 (L)    Protein, Total 8.8 (H)    Globulin 4.1 (H)    Anion Gap 17.0 (H)            Lactic acid 2.7 blood cx x 2 pending  CT head  2. 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    ECG  SINUS RHYTHM WITH 1ST DEGREE A-V BLOCK  LEFT VENTRICULAR HYPERTROPHY WITH QRS WIDENING  ABNORMAL ECG  WHEN COMPARED WITH ECG OF 04-Jan-2015 08:06, (UNCONFIRMED)  PR INTERVAL HAS INCREASED    MEDs  zofran 4mg  iv  Nacl 1L iv  Morphine 4mg  iv  Morphine 6mg  iv  reglan 10mg  iv  cipro 400mg  iv  carapres 0.1mg  po  Hydralazine 10mg  iv  Phenergan 12.5mg  iv x 2  Nicardipine iv gtt   DISPO Admit to Inpatient (Order [id number] written on 01/04/15 0618     01/05/15   98.49F  --  67  100 %  Monitor   26  119/75 mmHg       WBC 11.78 (H) 3.50 - 10.80 x10 3/uL    Hematocrit 38.5 (L)    RBC 3.95 (L)    MCH 33.2 (H)     Cr 2     MEDs  Nacl @ 159ml/hr  dexmedetomidine @ 0.40mcg/kr/hr iv gtt  zofran 4mg  iv qid prn   Vancocin 125mg  po q6hr         Attending note Hypertensive urgency (01/04/2015)   Hypertension (01/04/2015)   Assessment: not controlled with IV medication   IV  Labetalol 20 mg IVP once    Plan: transfer to ICU  Management of Hypertensive emergency as per ICU   CT head stat   Urine drug screen   Neurology and cardiology evaluation     Transverse Colitis / nausea, vomiting / epigastric pain (01/04/2015)   Assessment: acute  IV hydration   NPO   Cipro/ flagyl       Acute lactic acidosis secondary to colitis   IV hydration and continue to monitor     Nutrition  NPO       Cardiology note  Assessment:    Malignant hypertension   Noncompliance   Nausea, vomiting, abdominal pain - ? Colitis      Recommendations:    Acute management of hypertensive crisis as you are doing   Echocardiogram - assess for HCVD  Dcp pending    Albertina Senegal Derenda Giddings  Utilization Review Case Manager RN   [facility]   Main number [phone]   Fax [phone]   Direct Number [phone] (payers and providers only)

## 2015-01-05 NOTE — Plan of Care (Signed)
Problem: Health Promotion  Goal: Knowledge - health resources  Extent of understanding and conveyed about healthcare resources.   Intervention: Discharge planning  Pt educated regarding discharge instruction, medications and follow up info.  Written materials provided on new medications and side effects.  IV removed and telemetry discontinued.  Pt denies CP, abdominal pain and SOB or any other discomfort.  Pt verbalized understanding of teachings and all questions answered.  Pt is without complaint and v/s stable, wife at bedside ready to transport pt home.

## 2015-01-05 NOTE — Plan of Care (Addendum)
Problem: Safety  Goal: Patient will be free from injury during hospitalization  Outcome: Progressing    Problem: Pain  Goal: Patient's pain/discomfort is manageable  Outcome: Progressing    Problem: Nausea/Vomitting  Goal: Nutritional intake is adequate  Outcome: Progressing    Problem: Safe medical management of withdrawal from (list substances of abuse) AS EVIDENCED BY...  Goal: Safe detoxification/education about relapse prevention/engagement in discharge planning  Outcome: Progressing    Comments:   Pt alert Ox4, able to let needs be known, able to turn self in bed, SCD's intact, small amount of shakiness noted with touch and nausea while off of precedex, attempting to wean precedex overnight  SR in the 70's-80's with SBP in the 90's-110's, +3 pulses throughout, no edema  Lungs clear diminished at the bases, on RA with sats at 95-98%   Abd soft with BS hypoactive, no nausea with precedex currently at 0.1 mcg/kg/hr, able to take small to moderate amounts of CL diet   Using urinal with clear amber urine while on NS at 100 ml/hr

## 2015-01-05 NOTE — Plan of Care (Signed)
Problem: Safety  Goal: Patient will be free from injury during hospitalization  Intervention: Provide and maintain safe environment  Pt admitted to unit, telemetry applied, v/s taken and stable. Pt oriented to unit safety and falls precautions. Call bell placed within reach and bed locked in lowest position. Pt verbalized understanding of teachings. Pt denies any kind of pain, difficulty breathing or discomfort. Wife at bedside. Will continue to monitor pt for any changes.

## 2015-01-05 NOTE — Progress Notes (Signed)
[facility]  PROGRESS NOTE  [facility]       Date Time: 01/05/2015 12:00 PM  Patient Name: Mike Rice       Patient Active Problem List   Diagnosis   . Colitis   . Hypertension   . Hypertensive urgency   . Gastroesophageal reflux disease with esophagitis   . Altered mental status: noted upon admission in notes, but Neuro exam is normal.              Assessment:    Malignant hypertension- no LVH on echo. Normal LV systolic function. Now on labelotol.  .   Noncompliance   Nausea, vomiting, abdominal pain-resolved   AKI                   Recommendations:    Consider long term calcium channel antagonist for HTN.      Home Medications:     Prescriptions prior to admission   Medication Sig Dispense Refill Last Dose   . DOXYCYCLINE PO Take by mouth.            Medications:      Scheduled Meds: PRN Meds:    Current Facility-Administered Medications   Medication Dose Route Frequency   . enoxaparin  40 mg Subcutaneous Daily   . influenza  0.5 mL Intramuscular Once   . labetalol  200 mg Oral Q12H SCH   . vancomycin  125 mg Oral 4 times per day       Continuous Infusions:  . sodium chloride 100 mL/hr at 01/05/15 1000      hydrALAZINE 10 mg Once PRN   LORazepam 0.5 mg Q4H PRN   morphine 6 mg Q4H PRN   ondansetron 4 mg 4X Daily PRN              Subjective:   Denies chest pain, SOB or palpitations. No abdominal pain.      Physical Exam:     Filed Vitals:    01/05/15 1000   BP: 127/85   Pulse: 66   Temp:    Resp:    SpO2: 100%     Temp (24hrs), Avg:98.5 F (36.9 C), Min:98.2 F (36.8 C), Max:98.7 F (37.1 C)       Intake and Output Summary (Last 24 hours) at Date Time    Intake/Output Summary (Last 24 hours) at 01/05/15 1200  Last data filed at 01/05/15 1000   Gross per 24 hour   Intake 2065.46 ml   Output    700 ml   Net 1365.46 ml       General Appearance:  Breathing comfortable, no acute distress  Head:  normocephalic  Eyes:  EOM's intact  Neck:  No carotid bruit or jugular venous distension,  brisk carotid upstroke  Lungs:  Clear to auscultation throughout, no wheezes, rhonchi or rales, good respiratory effort   Chest Wall:  No tenderness or deformity  Heart:  Reg rhythm, nl s1s2, no murmur or rub  Abdomen:  Soft, non-tender, positive bowel sounds, no hepatojugular reflux  Extremities:  No cyanosis, clubbing or edema  Pulses:  2+ pedal, radial, brachial pulses equal bilateraly  Neurologic:  Alert and oriented x3, mood and affect normal    Labs:     Recent Labs  Lab 01/05/15  0316 01/04/15  1956 01/04/15  1417 01/04/15  0908   CREATINE KINASE (CK)  --  217 158 185   TROPONIN I 0.02  --  <0.01 0.01  Recent Labs  Lab 01/05/15  0316   BILIRUBIN, TOTAL 1.2   PROTEIN, TOTAL 6.8   ALBUMIN 3.6   ALT 11   AST (SGOT) 14           Recent Labs  Lab 01/04/15  0408   PT 13.7   PT INR 1.1   PTT 32       Recent Labs  Lab 01/05/15  0316 01/04/15  0408   WBC 11.78* 7.61   HGB 13.1 16.9   HEMATOCRIT 38.5* 47.2   PLATELETS 240 261       Recent Labs  Lab 01/05/15  0316 01/04/15  0408   SODIUM 139 146*   POTASSIUM 3.5 3.7   CHLORIDE 106 109   CO2 24 20*   BUN 26.0 7.0*   CREATININE 2.0* 1.0   EGFR 43.8 >60.0   GLUCOSE 106* 127*   CALCIUM 8.9 10.3       Imaging:   Radiological Procedure    Radiology Results (24 Hour)     Procedure Component Value Units Date/Time    XR Chest AP Portable [[id number]] Collected:  01/05/15 0912    Order Status:  Completed Updated:  01/05/15 0916    Narrative:      PORTABLE CHEST    CLINICAL STATEMENT: dyspnea    COMPARISON: 01/04/2015.     FINDINGS: The lungs are clear. The cardiomediastinal silhouette is  unremarkable.          Impression:        No acute cardiopulmonary disease.      Fonnie Mu, MD   01/05/2015 9:12 AM                      Telemetry:    SR.        Signed by: Ma Hillock, MD [facility]      [facility]  NP Spectralink [phone] (8am-5pm)  MD Spectralink [phone] (8am-5pm)  After hours, non urgent consult line [phone]  After Hours, urgent consults [phone]

## 2015-01-05 NOTE — Discharge Instructions (Signed)
[facility] DISCHARGE INSTRUCTIONS     Date of Admission: 01/04/2015    Date of Discharge: 01/05/2015    Discharge Physician: Jolyn Lent    You were admitted to [facility] for Hypertensive urgency and colitis. It is important that you make your follow up appointments listed below and take your medications as prescribed.      If you are unable to obtain an appointment, unable to obtain newly prescribed medications, or are unclear about any of your discharge instructions please contact your discharging physician at [phone] (M-F, 8am-3pm) or weekends and after hours via the hospital operator [phone], hospital case manager, or your primary care physician.    Follow up Appointments  Follow-up Information     Follow up with Primary care doctor in [place]  In 1 week.          Follow up with you primary care doctor regarding need for Colonoscopy after finishing antibiotics

## 2015-01-06 ENCOUNTER — Inpatient Hospital Stay (HOSPITAL_COMMUNITY)
Admission: EM | Admit: 2015-01-06 | Discharge: 2015-01-08 | DRG: 392 | Disposition: A | Discharge status: Home / Self Care | Payer: 59 | Attending: Internal Medicine | Admitting: Internal Medicine

## 2015-01-06 ENCOUNTER — Inpatient Hospital Stay (HOSPITAL_COMMUNITY): Payer: 59

## 2015-01-06 ENCOUNTER — Encounter (HOSPITAL_COMMUNITY): Payer: Self-pay | Admitting: *Deleted

## 2015-01-06 DIAGNOSIS — R1013 Epigastric pain: Secondary | ICD-10-CM | POA: Diagnosis not present

## 2015-01-06 DIAGNOSIS — Z79899 Other long term (current) drug therapy: Secondary | ICD-10-CM | POA: Diagnosis not present

## 2015-01-06 DIAGNOSIS — I10 Essential (primary) hypertension: Secondary | ICD-10-CM

## 2015-01-06 DIAGNOSIS — E876 Hypokalemia: Secondary | ICD-10-CM | POA: Diagnosis present

## 2015-01-06 DIAGNOSIS — R7989 Other specified abnormal findings of blood chemistry: Secondary | ICD-10-CM

## 2015-01-06 DIAGNOSIS — E872 Acidosis: Secondary | ICD-10-CM | POA: Diagnosis not present

## 2015-01-06 DIAGNOSIS — K529 Noninfective gastroenteritis and colitis, unspecified: Principal | ICD-10-CM | POA: Diagnosis present

## 2015-01-06 DIAGNOSIS — R933 Abnormal findings on diagnostic imaging of other parts of digestive tract: Secondary | ICD-10-CM | POA: Diagnosis present

## 2015-01-06 DIAGNOSIS — R112 Nausea with vomiting, unspecified: Secondary | ICD-10-CM

## 2015-01-06 DIAGNOSIS — I169 Hypertensive crisis, unspecified: Secondary | ICD-10-CM | POA: Diagnosis present

## 2015-01-06 DIAGNOSIS — R1110 Vomiting, unspecified: Secondary | ICD-10-CM | POA: Insufficient documentation

## 2015-01-06 DIAGNOSIS — F17210 Nicotine dependence, cigarettes, uncomplicated: Secondary | ICD-10-CM | POA: Diagnosis present

## 2015-01-06 DIAGNOSIS — R0600 Dyspnea, unspecified: Secondary | ICD-10-CM | POA: Diagnosis not present

## 2015-01-06 HISTORY — DX: Essential (primary) hypertension: I10

## 2015-01-06 LAB — CBC
HEMATOCRIT: 45.1 % (ref 39.0–52.0)
HEMOGLOBIN: 16.1 g/dL (ref 13.0–17.0)
MCH: 34.5 pg — AB (ref 26.0–34.0)
MCHC: 35.7 g/dL (ref 30.0–36.0)
MCV: 96.6 fL (ref 78.0–100.0)
Platelets: 229 10*3/uL (ref 150–400)
RBC: 4.67 MIL/uL (ref 4.22–5.81)
RDW: 13.9 % (ref 11.5–15.5)
WBC: 10.6 10*3/uL — AB (ref 4.0–10.5)

## 2015-01-06 LAB — COMPREHENSIVE METABOLIC PANEL
ALT: 16 U/L — ABNORMAL LOW (ref 17–63)
AST: 36 U/L (ref 15–41)
Albumin: 4.9 g/dL (ref 3.5–5.0)
Alkaline Phosphatase: 56 U/L (ref 38–126)
Anion gap: 13 (ref 5–15)
BUN: 14 mg/dL (ref 6–20)
CO2: 21 mmol/L — ABNORMAL LOW (ref 22–32)
Calcium: 9.5 mg/dL (ref 8.9–10.3)
Chloride: 105 mmol/L (ref 101–111)
Creatinine, Ser: 0.98 mg/dL (ref 0.61–1.24)
GFR calc Af Amer: >60 mL/min (ref >60)
Glucose, Bld: 139 mg/dL — ABNORMAL HIGH (ref 65–99)
POTASSIUM: 3.7 mmol/L (ref 3.5–5.1)
Sodium: 139 mmol/L (ref 135–145)
Total Bilirubin: 1.7 mg/dL — ABNORMAL HIGH (ref 0.3–1.2)
Total Protein: 9.3 g/dL — ABNORMAL HIGH (ref 6.5–8.1)

## 2015-01-06 LAB — MAGNESIUM: MAGNESIUM: 1.9 mg/dL (ref 1.7–2.4)

## 2015-01-06 LAB — DIFFERENTIAL
BASOS ABS: 0.1 10*3/uL (ref 0.0–0.1)
Basophils Relative: 1 %
Eosinophils Absolute: 0.1 10*3/uL (ref 0.0–0.7)
Eosinophils Relative: 1 %
Lymphocytes Relative: 23 %
Lymphs Abs: 2.3 10*3/uL (ref 0.7–4.0)
MONOS PCT: 8 %
Monocytes Absolute: 0.8 10*3/uL (ref 0.1–1.0)
NEUTROS PCT: 67 %
Neutro Abs: 6.6 10*3/uL (ref 1.7–7.7)

## 2015-01-06 LAB — I-STAT CG4 LACTIC ACID, ED: LACTIC ACID, VENOUS: 4.63 mmol/L — AB (ref 0.5–2.0)

## 2015-01-06 LAB — URINALYSIS, ROUTINE W REFLEX MICROSCOPIC
Bilirubin Urine: NEGATIVE
GLUCOSE, UA: 250 mg/dL — AB
Ketones, ur: NEGATIVE mg/dL
Leukocytes, UA: NEGATIVE
Nitrite: NEGATIVE
PH: 6 (ref 5.0–8.0)
Protein, ur: 30 mg/dL — AB
SPECIFIC GRAVITY, URINE: 1.015 (ref 1.005–1.030)
Urobilinogen, UA: 0.2 mg/dL (ref 0.0–1.0)

## 2015-01-06 LAB — MRSA PCR SCREENING: MRSA BY PCR: NEGATIVE

## 2015-01-06 LAB — I-STAT TROPONIN, ED: TROPONIN I, POC: 0 ng/mL (ref 0.00–0.08)

## 2015-01-06 LAB — LIPASE, BLOOD: LIPASE: 24 U/L (ref 11–51)

## 2015-01-06 LAB — OCCULT BLOOD GASTRIC / DUODENUM (SPECIMEN CUP)
Occult Blood, Gastric: POSITIVE — AB
pH, Gastric: 2

## 2015-01-06 LAB — URINE MICROSCOPIC-ADD ON

## 2015-01-06 LAB — LACTIC ACID, PLASMA: LACTIC ACID, VENOUS: 1.2 mmol/L (ref 0.5–2.0)

## 2015-01-06 MED ORDER — SENNA 8.6 MG PO TABS
2.0000 | ORAL_TABLET | Freq: Two times a day (BID) | ORAL | Status: DC
  Administered 2015-01-06 – 2015-01-07 (×2): 17.2 mg via ORAL
  Filled 2015-01-06 (×2): qty 2

## 2015-01-06 MED ORDER — SODIUM CHLORIDE 0.9 % IV SOLN
INTRAVENOUS | Status: DC
  Administered 2015-01-06: 10:00:00 via INTRAVENOUS

## 2015-01-06 MED ORDER — HYDRALAZINE HCL 20 MG/ML IJ SOLN
10.0000 mg | INTRAMUSCULAR | Status: AC
  Administered 2015-01-06: 10 mg via INTRAVENOUS
  Filled 2015-01-06: qty 1

## 2015-01-06 MED ORDER — METRONIDAZOLE IN NACL 5-0.79 MG/ML-% IV SOLN
500.0000 mg | Freq: Three times a day (TID) | INTRAVENOUS | Status: DC
  Administered 2015-01-06 – 2015-01-07 (×4): 500 mg via INTRAVENOUS
  Filled 2015-01-06 (×4): qty 100

## 2015-01-06 MED ORDER — ONDANSETRON HCL 4 MG/2ML IJ SOLN
4.0000 mg | Freq: Four times a day (QID) | INTRAMUSCULAR | Status: AC | PRN
  Administered 2015-01-06: 4 mg via INTRAVENOUS
  Filled 2015-01-06 (×2): qty 2

## 2015-01-06 MED ORDER — METRONIDAZOLE IN NACL 5-0.79 MG/ML-% IV SOLN
500.0000 mg | Freq: Three times a day (TID) | INTRAVENOUS | Status: AC
  Administered 2015-01-06: 500 mg via INTRAVENOUS
  Filled 2015-01-06: qty 100

## 2015-01-06 MED ORDER — PANTOPRAZOLE SODIUM 40 MG IV SOLR
40.0000 mg | INTRAVENOUS | Status: AC
  Administered 2015-01-06: 40 mg via INTRAVENOUS
  Filled 2015-01-06: qty 40

## 2015-01-06 MED ORDER — IOHEXOL 300 MG/ML  SOLN
100.0000 mL | Freq: Once | INTRAMUSCULAR | Status: AC | PRN
Start: 2015-01-06 — End: 2015-01-06
  Administered 2015-01-06: 100 mL via INTRAVENOUS

## 2015-01-06 MED ORDER — HYDROMORPHONE HCL 1 MG/ML IJ SOLN
1.0000 mg | Freq: Once | INTRAMUSCULAR | Status: AC
  Administered 2015-01-06: 1 mg via INTRAVENOUS
  Filled 2015-01-06: qty 1

## 2015-01-06 MED ORDER — LABETALOL HCL 5 MG/ML IV SOLN
10.0000 mg | INTRAVENOUS | Status: DC | PRN
  Administered 2015-01-06 (×3): 10 mg via INTRAVENOUS
  Filled 2015-01-06 (×3): qty 4

## 2015-01-06 MED ORDER — METOCLOPRAMIDE HCL 5 MG/ML IJ SOLN
10.0000 mg | INTRAMUSCULAR | Status: AC
  Administered 2015-01-06: 10 mg via INTRAVENOUS
  Filled 2015-01-06: qty 2

## 2015-01-06 MED ORDER — LABETALOL HCL 5 MG/ML IV SOLN
20.0000 mg | Freq: Once | INTRAVENOUS | Status: AC
  Administered 2015-01-06: 20 mg via INTRAVENOUS
  Filled 2015-01-06: qty 4

## 2015-01-06 MED ORDER — LORAZEPAM 2 MG/ML IJ SOLN
1.0000 mg | Freq: Once | INTRAMUSCULAR | Status: AC
  Administered 2015-01-06: 1 mg via INTRAVENOUS
  Filled 2015-01-06: qty 1

## 2015-01-06 MED ORDER — CIPROFLOXACIN IN D5W 400 MG/200ML IV SOLN
400.0000 mg | Freq: Two times a day (BID) | INTRAVENOUS | Status: DC
  Administered 2015-01-06 – 2015-01-07 (×3): 400 mg via INTRAVENOUS
  Filled 2015-01-06 (×3): qty 200

## 2015-01-06 MED ORDER — OXYCODONE-ACETAMINOPHEN 5-325 MG PO TABS
2.0000 | ORAL_TABLET | Freq: Once | ORAL | Status: AC
  Administered 2015-01-06: 2 via ORAL

## 2015-01-06 MED ORDER — HYDRALAZINE HCL 20 MG/ML IJ SOLN
10.0000 mg | Freq: Four times a day (QID) | INTRAMUSCULAR | Status: DC
  Administered 2015-01-06 – 2015-01-07 (×4): 10 mg via INTRAVENOUS
  Filled 2015-01-06 (×5): qty 1

## 2015-01-06 MED ORDER — CHLORPROMAZINE HCL 25 MG PO TABS
25.0000 mg | ORAL_TABLET | Freq: Three times a day (TID) | ORAL | Status: DC | PRN
  Filled 2015-01-06: qty 1

## 2015-01-06 MED ORDER — CLONIDINE HCL 0.1 MG PO TABS
0.2000 mg | ORAL_TABLET | Freq: Three times a day (TID) | ORAL | Status: DC
  Administered 2015-01-06 – 2015-01-07 (×4): 0.2 mg via ORAL
  Filled 2015-01-06 (×5): qty 2

## 2015-01-06 MED ORDER — GI COCKTAIL ~~LOC~~
30.0000 mL | Freq: Once | ORAL | Status: AC
  Administered 2015-01-06: 30 mL via ORAL
  Filled 2015-01-06: qty 30

## 2015-01-06 MED ORDER — POLYETHYLENE GLYCOL 3350 17 G PO PACK
17.0000 g | PACK | Freq: Two times a day (BID) | ORAL | Status: DC
  Administered 2015-01-06 – 2015-01-07 (×2): 17 g via ORAL
  Filled 2015-01-06 (×4): qty 1

## 2015-01-06 MED ORDER — SODIUM CHLORIDE 0.9 % IV BOLUS (SEPSIS)
1000.0000 mL | Freq: Once | INTRAVENOUS | Status: AC
  Administered 2015-01-06: 1000 mL via INTRAVENOUS

## 2015-01-06 MED ORDER — FAMOTIDINE IN NACL 20-0.9 MG/50ML-% IV SOLN
20.0000 mg | Freq: Once | INTRAVENOUS | Status: AC
  Administered 2015-01-06: 20 mg via INTRAVENOUS
  Filled 2015-01-06: qty 50

## 2015-01-06 MED ORDER — HYOSCYAMINE SULFATE 0.125 MG SL SUBL
0.2500 mg | SUBLINGUAL_TABLET | Freq: Once | SUBLINGUAL | Status: AC
  Administered 2015-01-06: 0.25 mg via SUBLINGUAL
  Filled 2015-01-06: qty 2

## 2015-01-06 MED ORDER — OXYCODONE-ACETAMINOPHEN 5-325 MG PO TABS
1.0000 | ORAL_TABLET | ORAL | Status: DC | PRN
Start: 2015-01-06 — End: 2015-01-08
  Filled 2015-01-06: qty 2

## 2015-01-06 MED ORDER — IOHEXOL 300 MG/ML  SOLN
25.0000 mL | Freq: Once | INTRAMUSCULAR | Status: AC | PRN
  Administered 2015-01-06: 25 mL via ORAL

## 2015-01-06 MED ORDER — ONDANSETRON HCL 4 MG/2ML IJ SOLN
4.0000 mg | Freq: Once | INTRAMUSCULAR | Status: AC | PRN
  Administered 2015-01-06: 4 mg via INTRAVENOUS
  Filled 2015-01-06: qty 2

## 2015-01-06 MED ORDER — NITROGLYCERIN IN D5W 200-5 MCG/ML-% IV SOLN
10.0000 ug/min | INTRAVENOUS | Status: DC
  Administered 2015-01-06: 10 ug/min via INTRAVENOUS
  Filled 2015-01-06: qty 250

## 2015-01-06 MED ORDER — ONDANSETRON HCL 4 MG/2ML IJ SOLN
4.0000 mg | Freq: Once | INTRAMUSCULAR | Status: AC
  Administered 2015-01-06: 4 mg via INTRAVENOUS
  Filled 2015-01-06: qty 2

## 2015-01-06 NOTE — ED Notes (Signed)
Patient went to xray 

## 2015-01-06 NOTE — Progress Notes (Signed)
Patient co/o nausea and noted emesis, at this time.Zofran 4 mg IV at this time.  Patient also  reports diffuse pain in mid abdomen  notified NP.

## 2015-01-06 NOTE — ED Notes (Signed)
Anthony LangeDiana Atkinson [phone] Wife need to go somewhere but if you need her she says please call her.

## 2015-01-06 NOTE — ED Notes (Signed)
Pt states that he began having N/V and excessive sweating on Sunday; pt was seen in [place] and was admitted for Colitis; pt states was released from the hospital this afternoon; pt drove home and has not filled any of the prescriptions; pt was ok when he left the hospital but states that the pain and N/V began again around 2am; pt rocking in triage; covered in sweat and moaning

## 2015-01-06 NOTE — ED Notes (Signed)
EKG given to Dr. Kohut 

## 2015-01-06 NOTE — Progress Notes (Signed)
*  PRELIMINARY RESULTS* Vascular Ultrasound Renal Artery Duplex has been completed. Findings suggest 1-59% renal artery stenosis bilaterally.   01/06/2015 2:25 PM Gertie FeyMichelle Shylah Dossantos, RVT, RDCS, RDMS

## 2015-01-06 NOTE — Progress Notes (Signed)
  Echocardiogram 2D Echocardiogram has been performed.  Anthony SavoyCasey N [name] 01/06/2015, 2:52 PM

## 2015-01-06 NOTE — Progress Notes (Signed)
   01/06/15 1917 01/06/15 1928  Vitals  BP (!) 215/141 mmHg --   MAP (mmHg) 168 --   Pulse Rate --  (!) 107  ECG Heart Rate --  (!) 105  Resp --  (!) 28  Oxygen Therapy  SpO2 --  100 %  Increase of Nitro gtt to 15 mcg at this time.

## 2015-01-06 NOTE — Progress Notes (Signed)
ANTIBIOTIC CONSULT NOTE - INITIAL  Pharmacy Consult for Cipro   Indication: Colitis  No Known Allergies     Vital Signs: Temp: 97.7 F (36.5 C) (10/25 0403) Temp Source: Oral (10/25 0403) BP: 227/121 mmHg (10/25 0450) Pulse Rate: 76 (10/25 0450) Intake/Output from previous day:   Intake/Output from this shift:    Labs:  Recent Labs  01/06/15 0330  WBC 10.6*  HGB 16.1  PLT 229  CREATININE 0.98   CrCl cannot be calculated (Unknown ideal weight.). No results for input(s): VANCOTROUGH, VANCOPEAK, VANCORANDOM, GENTTROUGH, GENTPEAK, GENTRANDOM, TOBRATROUGH, TOBRAPEAK, TOBRARND, AMIKACINPEAK, AMIKACINTROU, AMIKACIN in the last 72 hours.   Microbiology: No results found for this or any previous visit (from the past 720 hour(s)).  Medical History: Past Medical History  Diagnosis Date  . Colitis   . Hypertension     Medications:   (Not in a hospital admission) Scheduled:  . gi cocktail  30 mL Oral Once  .  HYDROmorphone (DILAUDID) injection  1 mg Intravenous Once  . pantoprazole (PROTONIX) IV  40 mg Intravenous STAT   Infusions:  . ciprofloxacin    . metronidazole    . sodium chloride     Assessment: 44 yoM c/o abdominal pain d/c'd today from hospital in [place] after diagnosis of colitis. Pt did not pick up any Rxs now c/o n/v/sweating.  IV Cipro per Rx. Flagyl (MD)  Goal of Therapy:  Treat colitis  Plan:  Cipro 400mg  IV q12h F/u Scr/cultures/levels  Susanne GreenhouseGreen, Ramzi Brathwaite R 01/06/2015,5:02 AM

## 2015-01-06 NOTE — ED Notes (Signed)
Pt started having hiccups and vomiting.  Heart rate elevated to over 200.  Notified MD.  Heart rate immediately came back to 120 which has been his baseline post vomiting.  MD med given for nausea

## 2015-01-06 NOTE — ED Notes (Signed)
Patient complaining of abdominal pain. Patient was released from hospital yesterday. He has not filled any of his prescriptions. He is nauseated and vomiting. Patient had fast food after getting out of the hospital. Patient stated they said I can eat solid food.

## 2015-01-06 NOTE — ED Provider Notes (Signed)
CSN: [id number]     Arrival date & time 01/06/15  0302 History   First MD Initiated Contact with Patient 01/06/15 [phone]     Chief Complaint  Patient presents with  . Excessive Sweating  . Emesis     (Consider location/radiation/quality/duration/timing/severity/associated sxs/prior Treatment) HPI Comments: Patient is a 44 year old male with a history of hypertension who presents to the emergency department for further evaluation of nausea, vomiting, shaking, and diaphoresis. Symptoms initially began 3 days ago at midnight while patient was in [place]. Patient was diagnosed with hypertensive crisis and colitis and discharged 12 hours ago. Patient drove home over this time and had sudden recurrence of symptoms at midnight this evening. Patient has had multiple episodes of dry heaves and emesis. Emesis in the emergency department appears bloody. He complains of persistent epigastric pain as well as nausea and diaphoresis. He states that he has not yet started the medications prescribed to him at discharge including ciprofloxacin, Flagyl, amlodipine, and labetalol. Patient denies any fever, headache, chest pain, shortness of breath, back pain, or extremity numbness or weakness. He has no history of abdominal surgeries. Patient is not followed by a primary care provider at present.  Patient is a 44 y.o. male presenting with vomiting. The history is provided by the patient. No language interpreter was used.  Emesis Associated symptoms: abdominal pain   Associated symptoms: no diarrhea and no headaches     Past Medical History  Diagnosis Date  . Colitis   . Hypertension    Past Surgical History  Procedure Laterality Date  . Facial cosmetic surgery      cysts removed   No family history on file. Social History  Substance Use Topics  . Smoking status: Current Every Day Smoker -- 1.00 packs/day    Types: Cigarettes  . Smokeless tobacco: None  . Alcohol Use: Yes     Comment: socially     Review of Systems  Constitutional: Negative for fever.  Respiratory: Negative for shortness of breath.   Cardiovascular: Negative for chest pain.  Gastrointestinal: Positive for nausea, vomiting and abdominal pain. Negative for diarrhea and blood in stool.  Neurological: Negative for syncope, weakness, numbness and headaches.  All other systems reviewed and are negative.   Allergies  Review of patient's allergies indicates no known allergies.  Home Medications   Prior to Admission medications   Medication Sig Start Date End Date Taking? Authorizing Provider  amLODipine (NORVASC) 10 MG tablet Take 10 mg by mouth daily.   Yes Historical Provider, MD  ciprofloxacin (CIPRO) 500 MG tablet Take 500 mg by mouth 2 (two) times daily.   Yes Historical Provider, MD  metroNIDAZOLE (FLAGYL) 500 MG tablet Take 500 mg by mouth 3 (three) times daily.   Yes Historical Provider, MD  labetalol (NORMODYNE) 200 MG tablet Take 200 mg by mouth 2 (two) times daily.    Historical Provider, MD   BP 227/121 mmHg  Pulse 76  Temp(Src) 97.7 F (36.5 C) (Oral)  Resp 22  SpO2 100%   Physical Exam  Constitutional: He is oriented to person, place, and time. He appears well-developed and well-nourished. He appears distressed.  Patient uncomfortable and diaphoretic. Pale appearing.  HENT:  Head: Normocephalic and atraumatic.  Eyes: Conjunctivae and EOM are normal. No scleral icterus.  Neck: Normal range of motion.  Cardiovascular: Normal rate, regular rhythm and intact distal pulses.   Pulmonary/Chest: Effort normal. No respiratory distress.  Respirations even and unlabored  Musculoskeletal: Normal range of motion.  Neurological:  He is alert and oriented to person, place, and time. He exhibits normal muscle tone. Coordination normal.  GCS 15. Patient ambulatory, steady gait; moving all extremities.  Skin: Skin is warm. No rash noted. He is diaphoretic. No erythema. There is pallor.  Psychiatric: He has a  normal mood and affect. His behavior is normal.  Nursing note and vitals reviewed.   ED Course  Procedures (including critical care time) Labs Review Labs Reviewed  COMPREHENSIVE METABOLIC PANEL - Abnormal; Notable for the following:    CO2 21 (*)    Glucose, Bld 139 (*)    Total Protein 9.3 (*)    ALT 16 (*)    Total Bilirubin 1.7 (*)    All other components within normal limits  CBC - Abnormal; Notable for the following:    WBC 10.6 (*)    MCH 34.5 (*)    All other components within normal limits  URINALYSIS, ROUTINE W REFLEX MICROSCOPIC (NOT AT [facility]) - Abnormal; Notable for the following:    Glucose, UA 250 (*)    Hgb urine dipstick SMALL (*)    Protein, ur 30 (*)    All other components within normal limits  OCCULT BLOOD GASTRIC / DUODENUM (SPECIMEN CUP) - Abnormal; Notable for the following:    Occult Blood, Gastric POSITIVE (*)    All other components within normal limits  I-STAT CG4 LACTIC ACID, ED - Abnormal; Notable for the following:    Lactic Acid, Venous 4.63 (*)    All other components within normal limits  LIPASE, BLOOD  DIFFERENTIAL  URINE MICROSCOPIC-ADD ON  I-STAT TROPOININ, ED    Imaging Review  No results found.   I have personally reviewed and evaluated these images and lab results as part of my medical decision-making.   EKG Interpretation None       Medications  metroNIDAZOLE (FLAGYL) IVPB 500 mg (500 mg Intravenous New Bag/Given 01/06/15 0514)  ciprofloxacin (CIPRO) IVPB 400 mg (400 mg Intravenous New Bag/Given 01/06/15 0517)  pantoprazole (PROTONIX) injection 40 mg (not administered)  hydrALAZINE (APRESOLINE) injection 10 mg (not administered)  ondansetron (ZOFRAN) injection 4 mg (4 mg Intravenous Given 01/06/15 0520)  ondansetron (ZOFRAN) injection 4 mg (4 mg Intravenous Given 01/06/15 0328)  HYDROmorphone (DILAUDID) injection 1 mg (1 mg Intravenous Given 01/06/15 0358)  metoCLOPramide (REGLAN) injection 10 mg (10 mg Intravenous Given  01/06/15 0359)  LORazepam (ATIVAN) injection 1 mg (1 mg Intravenous Given 01/06/15 0358)  famotidine (PEPCID) IVPB 20 mg premix (0 mg Intravenous Stopped 01/06/15 0450)  sodium chloride 0.9 % bolus 1,000 mL (0 mLs Intravenous Stopped 01/06/15 0450)  labetalol (NORMODYNE,TRANDATE) injection 20 mg (20 mg Intravenous Given 01/06/15 0450)  HYDROmorphone (DILAUDID) injection 1 mg (1 mg Intravenous Given 01/06/15 0514)  gi cocktail (Maalox,Lidocaine,Donnatal) (30 mLs Oral Given 01/06/15 0517)  sodium chloride 0.9 % bolus 1,000 mL (1,000 mLs Intravenous New Bag/Given 01/06/15 0515)    MDM   Final diagnoses:  Epigastric pain  Non-intractable vomiting with nausea, vomiting of unspecified type  Essential hypertension  Elevated lactic acid level  Colitis, acute    44 year old male presents to the emergency department for further evaluation of sudden onset epigastric pain with nausea, vomiting, and diaphoresis. Patient had similar symptoms 3 days ago while traveling in [place]. He presented to a local emergency department and was admitted for further workup given persistent nausea and vomiting with hypertension. Upon speaking with wife further, patient with baseline hypertension of 220s systolic over 120s to 130s diastolic ever since  discontinuing his hypertensive medications one year ago secondary to loss of insurance coverage. Blood pressure treated in the emergency department with labetalol and hydralazine. There is a low suspicion for hypertensive emergency/crisis, especially given history. No evidence of end organ damage on laboratory workup.  Patient had a CT scan of his abdomen performed at the hospital where he was admitted. He was diagnosed with colitis based on CT findings. Acute abdominal series ordered to rule out free air and obstruction. Physical exam notable for epigastric tenderness. No peritoneal signs. Patient does have a mild leukocytosis today. His lactic acid level is significantly  elevated at 4.63. Nausea and vomiting controlled in the emergency Department with Zofran 2, Reglan, and Ativan. Patient has been given Dilaudid for pain. Symptoms also treated with GI cocktail and Protonix. Positive gastric occult suspected to be secondary to mallory weiss tear from frequent emesis. Patient has had no stooling changes.  Patient to be admitted to the hospitalist service for further symptom management and trending of patient's lactate level. Discussed with Dr. Toniann Fail with plans for morning TRH team to assume care. Temp admission orders placed.   Filed Vitals:   01/06/15 0445 01/06/15 0450 01/06/15 0500 01/06/15 0515  BP: 227/121 227/121 221/131 208/109  Pulse: 76 76 79   Temp:      TempSrc:      Resp: SpO2: 100% 100% 100%      Antony Madura, PA-C 01/06/15 0546  0557 - Called by radiologist about DG acute abd series. Radiology noting dilation of small bowel to 9.4cm. Will repeat CT abd/pelvis. Dr. Toniann Fail made aware. Will also attempt to titrate systolic down to 161'W with nitroglycerin gtt. Anticipate surgical consult on resulting of CT A/P.  Antony Madura, PA-C 01/06/15 0600  Dione Booze, MD 01/06/15 [phone]

## 2015-01-06 NOTE — H&P (Addendum)
[facility] History and Physical  Anthony Atkinson WGN:[birth year][name] 06, 1972 DOA: 01/06/2015  Referring physician: EDP PCP: No primary care provider on file.   Chief Complaint: Nausea and Vomiting  HPI: Anthony Atkinson is a 44 y.o. male with long-standing history of uncontrolled hypertension, has not been able to afford medications for a few years, was visiting [place] and got admitted to [facility] in [place]. He was hospitalized for 2 days secondary to nausea and vomiting was diagnosed with colitis and treated with antibiotic's at the time, he had a CT of his abdomen during this hospitalization, in addition his hospitalization was complicated by hypertensive crisis requiring admission to the ICU and medications via IV drips to get his blood pressure better. His wife who is a transplant patient, was out of her transplant medications hence they had to leave [place] and come to [place]. Hence he was discharged yesterday afternoon, at the time of discharge he was doing better his blood pressure was better and his abdominal symptoms had improved as well. We were not able to fill the prescriptions yesterday afternoon, subsequently just drove to [facility] and presented to the ER early this morning due to recurrence of nausea and vomiting . He reported eating some fast food after discharge yesterday and apparently had a bowel movement at some point during the day yesterday. In the ER here his blood pressures were as high as 240/110s, he received multiple doses of IV labetalol and was started on a nitroglycerin drip. Also  had a KUB which was concerning for possible bowel obstruction, hence underwent a CT abdomen pelvis which did not show any small bowel obstruction but showed distention of the cecum with gas and stool and moderate distention of the right colon without any pericecal inflammation   Review of Systems: positives bolded Constitutional:  No weight loss,  night sweats, Fevers, chills, fatigue.  HEENT:  No headaches, Difficulty swallowing,Tooth/dental problems,Sore throat,  No sneezing, itching, ear ache, nasal congestion, post nasal drip,  Cardio-vascular:  No chest pain, Orthopnea, PND, swelling in lower extremities, anasarca, dizziness, palpitations  GI:  No heartburn, indigestion, abdominal pain, nausea, vomiting, diarrhea, change in bowel habits, loss of appetite  Resp:  No shortness of breath with exertion or at rest. No excess mucus, no productive cough, No non-productive cough, No coughing up of blood.No change in color of mucus.No wheezing.No chest wall deformity  Skin:  no rash or lesions.  GU:  no dysuria, change in color of urine, no urgency or frequency. No flank pain.  Musculoskeletal:  No joint pain or swelling. No decreased range of motion. No back pain.  Psych:  No change in mood or affect. No depression or anxiety. No memory loss.   Past Medical History  Diagnosis Date  . Colitis   . Hypertension    Past Surgical History  Procedure Laterality Date  . Facial cosmetic surgery      cysts removed   Social History:  reports that he has been smoking Cigarettes.  He has been smoking about 1.00 pack per day. He does not have any smokeless tobacco history on file. He reports that he drinks alcohol. He reports that he uses illicit drugs (Marijuana).  No Known Allergies  family history  -Positive for high blood pressure in his mother  Prior to Admission medications   Medication Sig Start Date End Date Taking? Authorizing Provider  amLODipine (NORVASC) 10 MG tablet Take 10 mg by mouth daily.   Yes Historical Provider, MD  ciprofloxacin (  CIPRO) 500 MG tablet Take 500 mg by mouth 2 (two) times daily.   Yes Historical Provider, MD  metroNIDAZOLE (FLAGYL) 500 MG tablet Take 500 mg by mouth 3 (three) times daily.   Yes Historical Provider, MD  labetalol (NORMODYNE) 200 MG tablet Take 200 mg by mouth 2 (two) times daily.     Historical Provider, MD   Physical Exam: Filed Vitals:   01/06/15 1200 01/06/15 1215 01/06/15 1230 01/06/15 1245  BP: 216/128 213/134 226/130 221/124  Pulse: 119     Temp:      TempSrc:      Resp: 19     Height:      Weight:      SpO2: 99%       Wt Readings from Last 3 Encounters:  01/06/15 82.9 kg (182 lb 12.2 oz)    General:  ill appearing, oriented to self place and person Eyes: PERRL, normal lids, irises & conjunctiva ENT: grossly normal hearing, lips & tongue Neck: no LAD, masses or thyromegaly Cardiovascular: RRR, no m/r/g. No LE edema. Telemetry: Tachycardic, sinus rhythm Respiratory: CTA bilaterally, no w/r/r. Normal respiratory effort. Abdomen: soft, ntnd Skin: no rash or induration seen on limited exam Musculoskeletal: grossly normal tone BUE/BLE Psychiatric: grossly normal mood and affect, speech fluent and appropriate Neurologic: grossly non-focal.          Labs on Admission:  Basic Metabolic Panel:  Recent Labs Lab 01/06/15 0330  NA 139  K 3.7  CL 105  CO2 21*  GLUCOSE 139*  BUN 14  CREATININE 0.98  CALCIUM 9.5  MG 1.9   Liver Function Tests:  Recent Labs Lab 01/06/15 0330  AST 36  ALT 16*  ALKPHOS 56  BILITOT 1.7*  PROT 9.3*  ALBUMIN 4.9    Recent Labs Lab 01/06/15 0330  LIPASE 24   No results for input(s): AMMONIA in the last 168 hours. CBC:  Recent Labs Lab 01/06/15 0330  WBC 10.6*  NEUTROABS 6.6  HGB 16.1  HCT 45.1  MCV 96.6  PLT 229   Cardiac Enzymes: No results for input(s): CKTOTAL, CKMB, CKMBINDEX, TROPONINI in the last 168 hours.  BNP (last 3 results) No results for input(s): BNP in the last 8760 hours.  ProBNP (last 3 results) No results for input(s): PROBNP in the last 8760 hours.  CBG: No results for input(s): GLUCAP in the last 168 hours.  Radiological Exams on Admission: Ct Abdomen Pelvis W Contrast  01/06/2015  CLINICAL DATA:  Epigastric pain, recent colitis, microhematuria EXAM: CT ABDOMEN AND  PELVIS WITH CONTRAST TECHNIQUE: Multidetector CT imaging of the abdomen and pelvis was performed using the standard protocol following bolus administration of intravenous contrast. CONTRAST:  OMNIPAQUE IOHEXOL 300 MG/ML  SOLN COMPARISON:  X-ray of the abdomen same day FINDINGS: Lung bases are unremarkable. Small hiatal hernia. Trace anterior pericardial effusion. There is thickening of distal esophageal wall. Reflux esophagitis cannot be excluded. Clinical correlation is necessary. Sagittal images of the spine are unremarkable. Enhanced liver, pancreas, spleen and adrenal glands are unremarkable. Kidneys are symmetrical in size and enhancement. No hydronephrosis or hydroureter. Delayed renal images shows bilateral renal symmetrical excretion. Mild atherosclerotic calcifications of abdominal aorta. No aortic aneurysm. Bilateral visualized proximal ureter is unremarkable. There is no small bowel obstruction. No thickened or dilated small bowel loops. There is significant distension of the cecum with gas and stool. There is a low lying cecum. No pericecal inflammation. The terminal ileum is unremarkable. The appendix is not identified. There is  moderate distension of the ascending colon. Mild gaseous distension of the transverse colon. Some colonic gas noted in descending colon. There is no evidence of thickening of colonic wall suggest acute colitis. No acute diverticulitis. Prostate gland and seminal vesicles are unremarkable. There is diffuse thickening of urinary bladder wall highly suspicious for cystitis. Clinical correlation is necessary. No destructive bony lesions are noted within pelvis. No inguinal adenopathy. No ascites or free air. IMPRESSION: 1. No evidence of small bowel obstruction. 2. There is distension of the cecum with gas and stool. Moderate distension of the right colon. There is no pericecal inflammation. The appendix is not identified. 3. No definite evidence of colitis or diverticulitis.  4. There is thickening of urinary bladder wall highly suspicious for cystitis. 5. No hydronephrosis or hydroureter. Electronically Signed   By: Natasha MeadLiviu  Pop M.D.   On: 01/06/2015 08:57   Dg Abd Acute W/chest  01/06/2015  CLINICAL DATA:  Acute onset of nausea, vomiting and mid-abdominal pain. Initial encounter. EXAM: DG ABDOMEN ACUTE W/ 1V CHEST COMPARISON:  None. FINDINGS: The lungs are well-aerated and clear. There is no evidence of focal opacification, pleural effusion or pneumothorax. The cardiomediastinal silhouette is within normal limits. There is marked dilatation of an apparent small bowel loop to 9.4 cm in maximal diameter at the upper mid abdomen, raising concern for underlying obstruction, though stool is noted within the colon, and nondilated loops of small bowel are also seen. No free intra-abdominal air is identified on the provided upright view. No acute osseous abnormalities are seen; the sacroiliac joints are unremarkable in appearance. IMPRESSION: 1. Marked dilatation of an apparent small bowel loop to 9.4 cm in maximal diameter at the upper mid abdomen, raising concern for underlying obstruction, though nondilated loops of small bowel are also seen, and stool is seen in the colon. No free intra-abdominal air seen. Alternatively, this could reflect an unusual appearance to the transverse colon, as the patient was recently diagnosed with colitis. 2. No acute cardiopulmonary process seen. These results were called by telephone at the time of interpretation on 01/06/2015 at 5:56 am to [facility] HUMES PA, who verbally acknowledged these results. Electronically Signed   By: Roanna RaiderJeffery  Chang M.D.   On: 01/06/2015 05:56    EKG: Independently reviewed.NSR, LVH  Assessment/Plan Active Problems:  1.  Hypertensive crisis -Long history of uncontrolled, untreated high blood pressure, LVH on EKG -Continue IV nitroglycerin, titrated for goal blood pressure of 180 systolic today -Also started on IV  hydralazine scheduled and IV labetalol when necessary -Added oral clonidine -Also check renal arterial duplex-to rule out renal artery stenosis -Check renin Aldosterone ratio -Check 2-D echocardiogram  2. Nausea and vomiting -Suspect secondary to #1 as well as recent possible colitis -[facility]  3. Recent Colitis -Resume ciprofloxacin and metronidazole IV -Clear liquids for now -Add Senokot and Miralax, No SBO on CT -supportive care for now  4. Elevated lactic acid -clinically do not see signs or symptoms of sepsis, hydrate, repeat lactic acid  DVT proph: will order DVT proph once his blood pressure is safely below 200 systolic  Code Status: Full code y Communication: Wife at bedside  Disposition Plan: Admit to ICU  Time spent: 60min  [facility]orial Satilla HealthJOSEPH,Blu Lori [facility] Pager [phone]

## 2015-01-07 DIAGNOSIS — K529 Noninfective gastroenteritis and colitis, unspecified: Principal | ICD-10-CM

## 2015-01-07 DIAGNOSIS — I169 Hypertensive crisis, unspecified: Secondary | ICD-10-CM

## 2015-01-07 LAB — BASIC METABOLIC PANEL
Anion gap: 10 (ref 5–15)
BUN: 11 mg/dL (ref 6–20)
CALCIUM: 9.2 mg/dL (ref 8.9–10.3)
CHLORIDE: 101 mmol/L (ref 101–111)
CO2: 27 mmol/L (ref 22–32)
CREATININE: 0.94 mg/dL (ref 0.61–1.24)
Glucose, Bld: 115 mg/dL — ABNORMAL HIGH (ref 65–99)
Potassium: 3.2 mmol/L — ABNORMAL LOW (ref 3.5–5.1)
Sodium: 138 mmol/L (ref 135–145)

## 2015-01-07 LAB — CBC
HEMATOCRIT: 40.1 % (ref 39.0–52.0)
HEMOGLOBIN: 14 g/dL (ref 13.0–17.0)
MCH: 33.9 pg (ref 26.0–34.0)
MCHC: 34.9 g/dL (ref 30.0–36.0)
MCV: 97.1 fL (ref 78.0–100.0)
Platelets: 249 10*3/uL (ref 150–400)
RBC: 4.13 MIL/uL — ABNORMAL LOW (ref 4.22–5.81)
RDW: 13.7 % (ref 11.5–15.5)
WBC: 9.9 10*3/uL (ref 4.0–10.5)

## 2015-01-07 LAB — TSH: TSH: 0.939 u[IU]/mL (ref 0.350–4.500)

## 2015-01-07 MED ORDER — ENOXAPARIN SODIUM 40 MG/0.4ML ~~LOC~~ SOLN
40.0000 mg | SUBCUTANEOUS | Status: DC
  Administered 2015-01-07: 40 mg via SUBCUTANEOUS
  Filled 2015-01-07 (×2): qty 0.4

## 2015-01-07 MED ORDER — HYDRALAZINE HCL 20 MG/ML IJ SOLN: 10.0000 mg | INTRAMUSCULAR | Status: DC | PRN

## 2015-01-07 MED ORDER — HYDRALAZINE HCL 25 MG PO TABS
25.0000 mg | ORAL_TABLET | Freq: Three times a day (TID) | ORAL | Status: DC
  Administered 2015-01-07 (×2): 25 mg via ORAL
  Filled 2015-01-07 (×2): qty 1

## 2015-01-07 MED ORDER — BISACODYL 10 MG RE SUPP
10.0000 mg | Freq: Once | RECTAL | Status: AC
  Administered 2015-01-07: 10 mg via RECTAL
  Filled 2015-01-07: qty 1

## 2015-01-07 MED ORDER — POLYETHYLENE GLYCOL 3350 17 G PO PACK
17.0000 g | PACK | Freq: Once | ORAL | Status: AC
  Administered 2015-01-07: 17 g via ORAL
  Filled 2015-01-07: qty 1

## 2015-01-07 MED ORDER — POLYETHYLENE GLYCOL 3350 17 G PO PACK: 17.0000 g | PACK | Freq: Every day | ORAL | Status: DC

## 2015-01-07 NOTE — Progress Notes (Signed)
Patient ID: Anthony LoopBrian Yeagle, male   DOB: 08/26/1970, 44 y.o.   MRN: [id number] [facility] PROGRESS NOTE  Anthony LoopBrian Bellomo WUJ:[id number] DOB: 09/11/1970 DOA: 01/06/2015 PCP: No primary care provider on file.   Brief narrative:    44 y.o. male with long-standing history of uncontrolled hypertension, unable to afford medications for a few years, recently got admitted to [facility] in [place], [place] for nausea and vomiting and HTN crisis (CT abd at that time notable for transverse colitis). Was placed on cipro and flagyl.He now [facility] with concern of persistent nausea and non bloody vomiting, unable to tolerate PO meds.   In ED, pt was hemodynamically stable, BP as high as 240/110. TRH asked to admit to SDU, pt started on Nitro drip.   Assessment/Plan:    Active Problems:   Hypertensive crisis - BP more stable this AM - place on Hydralazine 25 mg PO TID - add hydralazine as need for SBP > 155 - add Labetalol for sBP > 195 - continue with clonidine that was already started in ED - TSH pending     ? Acute colitis - GI team consulted, follow up on recommendations - advance diet as pt able to tolerate    Hypokalemia - supplement and repeat BMP in AM   DVT prophylaxis - Lovenox   Code Status: Full.  Family Communication:  plan of care discussed with the patient and wife at bedside  Disposition Plan: Home when stable.   IV access:  Peripheral IV  Procedures and diagnostic studies:    Ct Abdomen Pelvis W Contrast 01/06/2015  No evidence of small bowel obstruction. 2. There is distension of the cecum with gas and stool. Moderate distension of the right colon. There is no pericecal inflammation. The appendix is not identified. 3. No definite evidence of colitis or diverticulitis. 4. There is thickening of urinary bladder wall highly suspicious for cystitis. 5. No hydronephrosis or hydroureter.   Dg Abd Acute W/chest 01/06/2015  Marked dilatation of an  apparent small bowel Atkinson to 9.4 cm in maximal diameter at the upper mid abdomen, raising concern for underlying obstruction, though nondilated loops of small bowel are also seen, and stool is seen in the colon. No free intra-abdominal air seen. Alternatively, this could reflect an unusual appearance to the transverse colon, as the patient was recently diagnosed with colitis. 2. No acute cardiopulmonary process seen.  Medical Consultants:  GI  Other Consultants:  None   IAnti-Infectives:   Cipro and flagyl 10/25 -->  Debbora PrestoMAGICK-Deissy Guilbert, MD  [facility] Pager [phone]  If 7PM-7AM, please contact night-coverage [email or web address] Password [email or web address] 01/07/2015, 1:32 PM   LOS: 1 day   HPI/Subjective: No events overnight.   Objective: Filed Vitals:   01/07/15 1030 01/07/15 1100 01/07/15 1200 01/07/15 1300  BP: 168/98 153/90 148/100 125/79  Pulse:  89 100 91  Temp:   97.7 F (36.5 C)   TempSrc:   Oral   Resp:  15 17 23   Height:      Weight:      SpO2:  98% 98% 97%    Intake/Output Summary (Last 24 hours) at 01/07/15 1332 Last data filed at 01/07/15 0908  Gross per 24 hour  Intake 1901.45 ml  Output    860 ml  Net 1041.45 ml    Exam:   General:  Pt is alert, follows commands appropriately, not in acute distress  Cardiovascular: Regular rate and rhythm, S1/S2, no murmurs, no rubs, no gallops  Respiratory: Clear  to auscultation bilaterally, no wheezing, no crackles, no rhonchi  Abdomen: Soft, non tender, non distended, bowel sounds present, no guarding  Extremities: No edema, pulses DP and PT palpable bilaterally  Neuro: Grossly nonfocal  Data Reviewed: Basic Metabolic Panel:  Recent Labs Lab 01/06/15 0330 01/07/15 0350  NA 139 138  K 3.7 3.2*  CL 105 101  CO2 21* 27  GLUCOSE 139* 115*  BUN 14 11  CREATININE 0.98 0.94  CALCIUM 9.5 9.2  MG 1.9  --    Liver Function Tests:  Recent Labs Lab 01/06/15 0330  AST 36  ALT 16*  ALKPHOS 56  BILITOT 1.7*  PROT 9.3*   ALBUMIN 4.9    Recent Labs Lab 01/06/15 0330  LIPASE 24   CBC:  Recent Labs Lab 01/06/15 0330 01/07/15 0350  WBC 10.6* 9.9  NEUTROABS 6.6  --   HGB 16.1 14.0  HCT 45.1 40.1  MCV 96.6 97.1  PLT 229 249   Recent Results (from the past 240 hour(s))  MRSA PCR Screening     Status: None   Collection Time: 01/06/15 11:37 AM  Result Value Ref Range Status   MRSA by PCR NEGATIVE NEGATIVE Final    Comment:        The GeneXpert MRSA Assay (FDA approved for NASAL specimens only), is one component of a comprehensive MRSA colonization surveillance program. It is not intended to diagnose MRSA infection nor to guide or monitor treatment for MRSA infections.      Scheduled Meds: . bisacodyl  10 mg Rectal Once  . cloNIDine  0.2 mg Oral TID  . enoxaparin (LOVENOX) injection  40 mg Subcutaneous Q24H  . hydrALAZINE  25 mg Oral 3 times per day  . polyethylene glycol  17 g Oral BID  . polyethylene glycol  17 g Oral Once  . [START ON 01/08/2015] polyethylene glycol  17 g Oral Daily   Continuous Infusions:

## 2015-01-07 NOTE — Progress Notes (Signed)
Patient c/o hiccups new orders noted and received.

## 2015-01-07 NOTE — Care Management Note (Signed)
Case Management Note  Patient Details  Name: Anthony LoopBrian Atkinson MRN: [id number] Date of Birth: 03/31/1970  Subjective/Objective:         hypertensive crisis with iv ntg drip           Action/Plan:Date: January 07, 2015 Chart reviewed for concurrent status and case management needs. Will continue to follow patient for changes and needs: Marcelle Smilinghonda Kandise Riehle, RN, BSN, [place]   [phone]  Expected Discharge Date:   (UNKNOWN)               Expected Discharge Plan:  Home/Self Care  In-House Referral:     Discharge planning Services  CM Consult  Post Acute Care Choice:  NA Choice offered to:  NA  DME Arranged:    DME Agency:     HH Arranged:    HH Agency:     Status of Service:  In process, will continue to follow  Medicare Important Message Given:    Date Medicare IM Given:    Medicare IM give by:    Date Additional Medicare IM Given:    Additional Medicare Important Message give by:     If discussed at Long Length of Stay Meetings, dates discussed:    Additional Comments:  Golda AcreDavis, Marisol Giambra Lynn, RN 01/07/2015, 9:46 AM

## 2015-01-07 NOTE — Consult Note (Signed)
Referring Provider: Dr. Izola Price with [facility] Primary Care Physician:  No primary care provider on file. Primary Gastroenterologist:  Gentry Fitz  Reason for Consultation:  Abnormal CT scan with cecal distention  HPI: Anthony Atkinson is a 44 y.o. male with long-standing history of uncontrolled hypertension, has not been able to afford medications for a few years, was visiting [place] and got admitted to [facility] in [place], [place]. He was hospitalized for 2 days secondary to nausea and vomiting and HTN crisis.  He had a CT of his abdomen/pelvis during that hospitalization for the nausea and vomiting, which showed some transverse colitis.  Was placed on cipro and flagyl.  Symptoms and BP improved so he was discharged and came back to [place].  Was only out of the hospital several hours before presenting to [facility] ED early yesterday morning due to recurrence of nausea and vomiting.  His antibiotics were continued.  Imaging was repeated here.  KUB was concerning for possible bowel obstruction, hence underwent a CT abdomen pelvis which did not show any small bowel obstruction but showed distention of the cecum with gas and stool and moderate distention of the right colon without any pericecal inflammation.  No sign of colitis.  He denies ever having diarrhea or rectal bleeding.  He reports some epigastric abdominal pain but no lower pain or discomfort.  Says that he was moving his bowels regularly at home before these hospitalizations.  Apparently also had a bowel movement at some point before his discharge from [facility] as well.  In the ER here his blood pressures were as high as 240/110s, he received multiple doses of IV labetalol and was started on a nitroglycerin drip.  Admitted to the ICU.  Blood pressures are improving and nausea and vomiting has resolved at this point; last was at 2 AM.  Tolerated some clear liquids this AM.   Past Medical History  Diagnosis Date  . Colitis   .  Hypertension     Past Surgical History  Procedure Laterality Date  . Facial cosmetic surgery      cysts removed    Prior to Admission medications   Medication Sig Start Date End Date Taking? Authorizing Provider  amLODipine (NORVASC) 10 MG tablet Take 10 mg by mouth daily.   Yes Historical Provider, MD  ciprofloxacin (CIPRO) 500 MG tablet Take 500 mg by mouth 2 (two) times daily.   Yes Historical Provider, MD  metroNIDAZOLE (FLAGYL) 500 MG tablet Take 500 mg by mouth 3 (three) times daily.   Yes Historical Provider, MD  labetalol (NORMODYNE) 200 MG tablet Take 200 mg by mouth 2 (two) times daily.    Historical Provider, MD    Current Facility-Administered Medications  Medication Dose Route Frequency Provider Last Rate Last Dose  . chlorproMAZINE (THORAZINE) tablet 25 mg  25 mg Oral TID PRN Roma Kayser Schorr, NP      . ciprofloxacin (CIPRO) IVPB 400 mg  400 mg Intravenous Q12H Zannie Cove, MD 200 mL/hr at 01/07/15 0439 400 mg at 01/07/15 0439  . cloNIDine (CATAPRES) tablet 0.2 mg  0.2 mg Oral TID Zannie Cove, MD   0.2 mg at 01/07/15 1030  . enoxaparin (LOVENOX) injection 40 mg  40 mg Subcutaneous Q24H Dorothea Ogle, MD   40 mg at 01/07/15 1030  . hydrALAZINE (APRESOLINE) injection 10 mg  10 mg Intravenous Q4H PRN Dorothea Ogle, MD      . hydrALAZINE (APRESOLINE) tablet 25 mg  25 mg Oral 3 times per  day Dorothea Ogle, MD      . labetalol (NORMODYNE,TRANDATE) injection 10 mg  10 mg Intravenous Q2H PRN Zannie Cove, MD   10 mg at 01/06/15 2206  . metroNIDAZOLE (FLAGYL) IVPB 500 mg  500 mg Intravenous Q8H Zannie Cove, MD 100 mL/hr at 01/07/15 0908 500 mg at 01/07/15 0908  . oxyCODONE-acetaminophen (PERCOCET/ROXICET) 5-325 MG per tablet 1-2 tablet  1-2 tablet Oral Q4H PRN Roma Kayser Schorr, NP      . polyethylene glycol (MIRALAX / GLYCOLAX) packet 17 g  17 g Oral BID Zannie Cove, MD   17 g at 01/07/15 1030  . senna (SENOKOT) tablet 17.2 mg  2 tablet Oral BID Zannie Cove, MD    17.2 mg at 01/07/15 1030    Allergies as of 01/06/2015  . (No Known Allergies)    No family history on file.  Social History   Social History  . Marital Status: Married    Spouse Name: N/A  . Number of Children: N/A  . Years of Education: N/A   Occupational History  . Not on file.   Social History Main Topics  . Smoking status: Current Every Day Smoker -- 1.00 packs/day    Types: Cigarettes  . Smokeless tobacco: Not on file  . Alcohol Use: Yes     Comment: socially  . Drug Use: Yes    Special: Marijuana  . Sexual Activity: Not on file   Other Topics Concern  . Not on file   Social History Narrative  . No narrative on file    Review of Systems: Ten point ROS is O/W negative except as mentioned in HPI.  Physical Exam: Vital signs in last 24 hours: Temp:  [97 F (36.1 C)-98.8 F (37.1 C)] 98.3 F (36.8 C) (10/26 0800) Pulse Rate:  [83-158] 99 (10/26 0900) Resp:  [11-34] 24 (10/26 0900) BP: (114-226)/(53-154) 168/98 mmHg (10/26 1030) SpO2:  [96 %-100 %] 99 % (10/26 0900) Last BM Date: 01/05/15 General:  Alert, Well-developed, well-nourished, pleasant and cooperative in NAD Head:  Normocephalic and atraumatic. Eyes:  Sclera clear, no icterus.  Conjunctiva pink. Ears:  Normal auditory acuity. Mouth:  No deformity or lesions.   Lungs:  Clear throughout to auscultation.  No wheezes, crackles, or rhonchi.  Heart:  Regular rate and rhythm; no murmurs, clicks, rubs, or gallops. Abdomen:  Soft, non-distended.  BS present.  Non-tender. Rectal:  Deferred  Msk:  Symmetrical without gross deformities. Pulses:  Normal pulses noted. Extremities:  Without clubbing or edema. Neurologic:  Alert and  oriented x4;  grossly normal neurologically. Skin:  Intact without significant lesions or rashes. Psych:  Alert and cooperative. Normal mood and affect.   Lab Results:  Recent Labs  01/06/15 0330 01/07/15 0350  WBC 10.6* 9.9  HGB 16.1 14.0  HCT 45.1 40.1  PLT 229  249   BMET  Recent Labs  01/06/15 0330 01/07/15 0350  NA 139 138  K 3.7 3.2*  CL 105 101  CO2 21* 27  GLUCOSE 139* 115*  BUN 14 11  CREATININE 0.98 0.94  CALCIUM 9.5 9.2   LFT  Recent Labs  01/06/15 0330  PROT 9.3*  ALBUMIN 4.9  AST 36  ALT 16*  ALKPHOS 56  BILITOT 1.7*   Studies/Results: Ct Abdomen Pelvis W Contrast  01/06/2015  CLINICAL DATA:  Epigastric pain, recent colitis, microhematuria EXAM: CT ABDOMEN AND PELVIS WITH CONTRAST TECHNIQUE: Multidetector CT imaging of the abdomen and pelvis was performed using the standard protocol following  bolus administration of intravenous contrast. CONTRAST:  OMNIPAQUE IOHEXOL 300 MG/ML  SOLN COMPARISON:  X-ray of the abdomen same day FINDINGS: Lung bases are unremarkable. Small hiatal hernia. Trace anterior pericardial effusion. There is thickening of distal esophageal wall. Reflux esophagitis cannot be excluded. Clinical correlation is necessary. Sagittal images of the spine are unremarkable. Enhanced liver, pancreas, spleen and adrenal glands are unremarkable. Kidneys are symmetrical in size and enhancement. No hydronephrosis or hydroureter. Delayed renal images shows bilateral renal symmetrical excretion. Mild atherosclerotic calcifications of abdominal aorta. No aortic aneurysm. Bilateral visualized proximal ureter is unremarkable. There is no small bowel obstruction. No thickened or dilated small bowel loops. There is significant distension of the cecum with gas and stool. There is a low lying cecum. No pericecal inflammation. The terminal ileum is unremarkable. The appendix is not identified. There is moderate distension of the ascending colon. Mild gaseous distension of the transverse colon. Some colonic gas noted in descending colon. There is no evidence of thickening of colonic wall suggest acute colitis. No acute diverticulitis. Prostate gland and seminal vesicles are unremarkable. There is diffuse thickening of urinary  bladder wall highly suspicious for cystitis. Clinical correlation is necessary. No destructive bony lesions are noted within pelvis. No inguinal adenopathy. No ascites or free air. IMPRESSION: 1. No evidence of small bowel obstruction. 2. There is distension of the cecum with gas and stool. Moderate distension of the right colon. There is no pericecal inflammation. The appendix is not identified. 3. No definite evidence of colitis or diverticulitis. 4. There is thickening of urinary bladder wall highly suspicious for cystitis. 5. No hydronephrosis or hydroureter. Electronically Signed   By: Natasha Mead M.D.   On: 01/06/2015 08:57   Dg Abd Acute W/chest  01/06/2015  CLINICAL DATA:  Acute onset of nausea, vomiting and mid-abdominal pain. Initial encounter. EXAM: DG ABDOMEN ACUTE W/ 1V CHEST COMPARISON:  None. FINDINGS: The lungs are well-aerated and clear. There is no evidence of focal opacification, pleural effusion or pneumothorax. The cardiomediastinal silhouette is within normal limits. There is marked dilatation of an apparent small bowel loop to 9.4 cm in maximal diameter at the upper mid abdomen, raising concern for underlying obstruction, though stool is noted within the colon, and nondilated loops of small bowel are also seen. No free intra-abdominal air is identified on the provided upright view. No acute osseous abnormalities are seen; the sacroiliac joints are unremarkable in appearance. IMPRESSION: 1. Marked dilatation of an apparent small bowel loop to 9.4 cm in maximal diameter at the upper mid abdomen, raising concern for underlying obstruction, though nondilated loops of small bowel are also seen, and stool is seen in the colon. No free intra-abdominal air seen. Alternatively, this could reflect an unusual appearance to the transverse colon, as the patient was recently diagnosed with colitis. 2. No acute cardiopulmonary process seen. These results were called by telephone at the time of  interpretation on 01/06/2015 at 5:56 am to [facility] PA, who verbally acknowledged these results. Electronically Signed   By: Roanna Raider M.D.   On: 01/06/2015 05:56   IMPRESSION/PLAN:  -Nausea and vomiting:  Likely secondary to HTN crisis.  Now resolving with improvement of BP.  Was treated for colitis, however, did not have symptoms of such.  ? If initial CT scan findings were spasm or artifact.  Now CT scan showing some dilated cecum and right colon with stool and air.  Abdominal exam is completely benign.  Will discontinue antibiotics and will give  a couple of doses of Miralax and a dulcolax suppository to clean him out.  Will need to follow-up as outpatient for colonoscopy at least for screening purposes.   ZEHR, JESSICA D.  01/07/2015, 11:13 AM  Pager number [phone]  [facility] Attending  I have also seen and assessed the patient and agree with the advanced practitioner's assessment and plan. Images viewed and outside records reviewed by Care Everywhere.  I think colon finidings are incidental and inconsequential.  Iva Booparl E. May Manrique, MD, [facility] [phone] (pager) 01/07/2015 8:09 PM

## 2015-01-08 ENCOUNTER — Encounter: Payer: Self-pay | Admitting: Internal Medicine

## 2015-01-08 DIAGNOSIS — R933 Abnormal findings on diagnostic imaging of other parts of digestive tract: Secondary | ICD-10-CM

## 2015-01-08 DIAGNOSIS — R7989 Other specified abnormal findings of blood chemistry: Secondary | ICD-10-CM | POA: Insufficient documentation

## 2015-01-08 DIAGNOSIS — E872 Acidosis: Secondary | ICD-10-CM

## 2015-01-08 DIAGNOSIS — R1013 Epigastric pain: Secondary | ICD-10-CM | POA: Insufficient documentation

## 2015-01-08 DIAGNOSIS — I10 Essential (primary) hypertension: Secondary | ICD-10-CM | POA: Insufficient documentation

## 2015-01-08 DIAGNOSIS — R112 Nausea with vomiting, unspecified: Secondary | ICD-10-CM

## 2015-01-08 DIAGNOSIS — R1110 Vomiting, unspecified: Secondary | ICD-10-CM | POA: Insufficient documentation

## 2015-01-08 LAB — ECG 12-LEAD
Atrial Rate: 70 {beats}/min
P Axis: 40 degrees
P-R Interval: 158 ms
Q-T Interval: 474 ms
QRS Duration: 114 ms
QTC Calculation (Bezet): 511 ms
R Axis: 78 degrees
T Axis: -9 degrees
Ventricular Rate: 70 {beats}/min

## 2015-01-08 LAB — CBC
HCT: 38.2 % — ABNORMAL LOW (ref 39.0–52.0)
Hemoglobin: 13.2 g/dL (ref 13.0–17.0)
MCH: 33.7 pg (ref 26.0–34.0)
MCHC: 34.6 g/dL (ref 30.0–36.0)
MCV: 97.4 fL (ref 78.0–100.0)
PLATELETS: 232 10*3/uL (ref 150–400)
RBC: 3.92 MIL/uL — ABNORMAL LOW (ref 4.22–5.81)
RDW: 13.8 % (ref 11.5–15.5)
WBC: 6.1 10*3/uL (ref 4.0–10.5)

## 2015-01-08 LAB — BASIC METABOLIC PANEL
Anion gap: 8 (ref 5–15)
BUN: 12 mg/dL (ref 6–20)
CALCIUM: 8.9 mg/dL (ref 8.9–10.3)
CO2: 27 mmol/L (ref 22–32)
CREATININE: 0.95 mg/dL (ref 0.61–1.24)
Chloride: 102 mmol/L (ref 101–111)
GFR calc Af Amer: >60 mL/min (ref >60)
GLUCOSE: 106 mg/dL — AB (ref 65–99)
Potassium: 3.3 mmol/L — ABNORMAL LOW (ref 3.5–5.1)
Sodium: 137 mmol/L (ref 135–145)

## 2015-01-08 MED ORDER — POTASSIUM CHLORIDE CRYS ER 20 MEQ PO TBCR
40.0000 meq | EXTENDED_RELEASE_TABLET | Freq: Once | ORAL | Status: AC
  Administered 2015-01-08: 40 meq via ORAL
  Filled 2015-01-08: qty 2

## 2015-01-08 MED ORDER — CLONIDINE HCL 0.2 MG PO TABS: 0.2000 mg | ORAL_TABLET | Freq: Two times a day (BID) | ORAL | Status: DC

## 2015-01-08 MED ORDER — HYDRALAZINE HCL 25 MG PO TABS: 25.0000 mg | ORAL_TABLET | Freq: Two times a day (BID) | ORAL | Status: DC

## 2015-01-08 MED ORDER — POLYETHYLENE GLYCOL 3350 17 G PO PACK: 17.0000 g | PACK | Freq: Every day | ORAL | Status: DC

## 2015-01-08 NOTE — Progress Notes (Signed)
     [facility] At[facility] Progress Note  Subjective:  Feels well. No nausea or vomiting. Had 4 BMs since last night--all loose, but he had not had solid food in 4 days per pt. Tol reg diet. Would like to establish care with a PCP in [place].   Objective:  Vital signs in last 24 hours: Temp:  [97.7 F (36.5 C)-98.9 F (37.2 C)] 98.2 F (36.8 C) (10/27 0800) Pulse Rate:  [65-100] 68 (10/27 0700) Resp:  [9-27] 18 (10/27 0700) BP: (94-177)/(50-100) 111/60 mmHg (10/27 0700) SpO2:  [96 %-99 %] 98 % (10/27 0700) Last BM Date: 01/05/15 General:   Alert,  Well-developed, in NAD Heart:  Regular rate and rhythm; no murmurs Pulm;lungs clear Abdomen:  Soft, nontender and nondistended. Normal bowel sounds, without guarding, and without rebound.   Extremities:  Without edema. Neurologic: Alert and  oriented x4;  grossly normal neurologically. Psych:  Alert and cooperative. Normal mood and affect.    ASSESSMENT/PLAN:   -Nausea and vomiting: Likely secondary to HTN crisis. Now resolving with improvement of BP. Was treated for colitis, however, did not have symptoms of such. ? If initial CT scan findings were spasm or artifact. Now CT scan showing some dilated cecum and right colon with stool and air. Abdominal exam is completely benign.Antibiotics were discontinued. Moved bowels after miralax. Will need to follow-up as outpatient for colonoscopy at least for screening purposes--f/u appt made.     LOS: 2 days   Hvozdovic, Tollie PizzaLori P PA-C 01/08/2015, Pager [phone] Mon-Fri 8a-5p [phone] after 5p, weekends, holidays  [facility] Attending  I have also seen and assessed the patient and agree with the advanced practitioner's assessment and plan. I do not think he has had colitis as already stated. He will f/u as outpatient.  Iva Booparl E. Kade Demicco, MD, Antionette FairyFACG [facility] [phone] (pager) 01/08/2015 12:30 PM

## 2015-01-08 NOTE — Discharge Instructions (Signed)
Hypertension Hypertension, commonly called high blood pressure, is when the force of blood pumping through your arteries is too strong. Your arteries are the blood vessels that carry blood from your heart throughout your body. A blood pressure reading consists of a higher number over a lower number, such as 110/72. The higher number (systolic) is the pressure inside your arteries when your heart pumps. The lower number (diastolic) is the pressure inside your arteries when your heart relaxes. Ideally you want your blood pressure below 120/80. Hypertension forces your heart to work harder to pump blood. Your arteries may become narrow or stiff. Having untreated or uncontrolled hypertension can cause heart attack, stroke, kidney disease, and other problems. RISK FACTORS Some risk factors for high blood pressure are controllable. Others are not.  Risk factors you cannot control include:   Race. You may be at higher risk if you are African American.  Age. Risk increases with age.  Gender. Men are at higher risk than women before age 45 years. After age 65, women are at higher risk than men. Risk factors you can control include:  Not getting enough exercise or physical activity.  Being overweight.  Getting too much fat, sugar, calories, or salt in your diet.  Drinking too much alcohol. SIGNS AND SYMPTOMS Hypertension does not usually cause signs or symptoms. Extremely high blood pressure (hypertensive crisis) may cause headache, anxiety, shortness of breath, and nosebleed. DIAGNOSIS To check if you have hypertension, your health care provider will measure your blood pressure while you are seated, with your arm held at the level of your heart. It should be measured at least twice using the same arm. Certain conditions can cause a difference in blood pressure between your right and left arms. A blood pressure reading that is higher than normal on one occasion does not mean that you need treatment. If  it is not clear whether you have high blood pressure, you may be asked to return on a different day to have your blood pressure checked again. Or, you may be asked to monitor your blood pressure at home for 1 or more weeks. TREATMENT Treating high blood pressure includes making lifestyle changes and possibly taking medicine. Living a healthy lifestyle can help lower high blood pressure. You may need to change some of your habits. Lifestyle changes may include:  Following the DASH diet. This diet is high in fruits, vegetables, and whole grains. It is low in salt, red meat, and added sugars.  Keep your sodium intake below 2,300 mg per day.  Getting at least 30-45 minutes of aerobic exercise at least 4 times per week.  Losing weight if necessary.  Not smoking.  Limiting alcoholic beverages.  Learning ways to reduce stress. Your health care provider may prescribe medicine if lifestyle changes are not enough to get your blood pressure under control, and if one of the following is true:  You are 18-59 years of age and your systolic blood pressure is above 140.  You are 60 years of age or older, and your systolic blood pressure is above 150.  Your diastolic blood pressure is above 90.  You have diabetes, and your systolic blood pressure is over 140 or your diastolic blood pressure is over 90.  You have kidney disease and your blood pressure is above 140/90.  You have heart disease and your blood pressure is above 140/90. Your personal target blood pressure may vary depending on your medical conditions, your age, and other factors. HOME CARE INSTRUCTIONS    Have your blood pressure rechecked as directed by your health care provider.   Take medicines only as directed by your health care provider. Follow the directions carefully. Blood pressure medicines must be taken as prescribed. The medicine does not work as well when you skip doses. Skipping doses also puts you at risk for  problems.  Do not smoke.   Monitor your blood pressure at home as directed by your health care provider. SEEK MEDICAL CARE IF:   You think you are having a reaction to medicines taken.  You have recurrent headaches or feel dizzy.  You have swelling in your ankles.  You have trouble with your vision. SEEK IMMEDIATE MEDICAL CARE IF:  You develop a severe headache or confusion.  You have unusual weakness, numbness, or feel faint.  You have severe chest or abdominal pain.  You vomit repeatedly.  You have trouble breathing. MAKE SURE YOU:   Understand these instructions.  Will watch your condition.  Will get help right away if you are not doing well or get worse.   This information is not intended to replace advice given to you by your health care provider. Make sure you discuss any questions you have with your health care provider.   Document Released: 02/28/2005 Document Revised: 07/15/2014 Document Reviewed: 12/21/2012 Elsevier Interactive Patient Education 2016 [employer]  

## 2015-01-08 NOTE — Progress Notes (Signed)
Pt discharge summary explained. Prescriptions will be sent to his pharmacy by Dr. Izola PriceMyers. IV's taken out and VSS.

## 2015-01-08 NOTE — Discharge Summary (Signed)
Physician Discharge Summary  Anthony Atkinson ZOX:[id number] DOB: 03/04/1971 DOA: 01/06/2015  PCP: No primary care provider on file.  Admit date: 01/06/2015 Discharge date: 01/08/2015  Recommendations for Outpatient Follow-up:  1. Pt will need to follow up with PCP in 2-3 weeks post discharge 2. Please obtain BMP to evaluate electrolytes and kidney function, K 3. K was supplemented prior to discharge   Discharge Diagnoses:  Active Problems:   Abnormal CT scan, colon   Hypertensive crisis   Discharge Condition: Stable  Diet recommendation: Heart healthy diet discussed in details    Brief narrative:    44 y.o. male with long-standing history of uncontrolled hypertension, unable to afford medications for a few years, recently got admitted to [facility] in [place], [place] for nausea and vomiting and HTN crisis (CT abd at that time notable for transverse colitis). Was placed on cipro and flagyl.He now presented to [facility] ED with concern of persistent nausea and non bloody vomiting, unable to tolerate PO meds.   In ED, pt was hemodynamically stable, BP as high as 240/110. TRH asked to admit to SDU, pt started on Nitro drip.   Assessment/Plan:    Active Problems:  Hypertensive crisis - BP stable this AM - continue Hydralazine 25 mg PO and clonidine upon discharge    ? Acute colitis - GI team consulted, recommended stopping ABX - pt doing well off ABX in 24 hours, no fevers, tolerating diet well    Hypokalemia - supplemented   Code Status: Full.  Family Communication: plan of care discussed with the patient and wife at bedside  Disposition Plan: Home   IV access:  Peripheral IV  Procedures and diagnostic studies:   Ct Abdomen Pelvis W Contrast 01/06/2015 No evidence of small bowel obstruction. 2. There is distension of the cecum with gas and stool. Moderate distension of the right colon. There is no pericecal inflammation. The appendix is not  identified. 3. No definite evidence of colitis or diverticulitis. 4. There is thickening of urinary bladder wall highly suspicious for cystitis. 5. No hydronephrosis or hydroureter.   Dg Abd Acute W/chest 01/06/2015 Marked dilatation of an apparent small bowel loop to 9.4 cm in maximal diameter at the upper mid abdomen, raising concern for underlying obstruction, though nondilated loops of small bowel are also seen, and stool is seen in the colon. No free intra-abdominal air seen. Alternatively, this could reflect an unusual appearance to the transverse colon, as the patient was recently diagnosed with colitis. 2. No acute cardiopulmonary process seen.  Medical Consultants:  GI  Other Consultants:  None   IAnti-Infectives:   Cipro and flagyl 10/25 -->      Discharge Exam: Filed Vitals:   01/08/15 0930  BP: 128/88  Pulse: 67  Temp:   Resp: 14   Filed Vitals:   01/08/15 0600 01/08/15 0700 01/08/15 0800 01/08/15 0930  BP: 117/68 111/60  128/88  Pulse: 65 68  67  Temp:   98.2 F (36.8 C)   TempSrc:   Oral   Resp: Height:      Weight:      SpO2: 99% 98%  100%    General: Pt is alert, follows commands appropriately, not in acute distress Cardiovascular: Regular rate and rhythm, S1/S2 +, no murmurs, no rubs, no gallops Respiratory: Clear to auscultation bilaterally, no wheezing, no crackles, no rhonchi Abdominal: Soft, non tender, non distended, bowel sounds +, no guarding Extremities: no edema, no cyanosis, pulses palpable bilaterally DP  and PT Neuro: Grossly nonfocal  Discharge Instructions  Discharge Instructions    Diet - low sodium heart healthy    Complete by:  As directed      Increase activity slowly    Complete by:  As directed             Medication List    STOP taking these medications        amLODipine 10 MG tablet  Commonly known as:  NORVASC     ciprofloxacin 500 MG tablet  Commonly known as:  CIPRO     labetalol 200 MG  tablet  Commonly known as:  NORMODYNE     metroNIDAZOLE 500 MG tablet  Commonly known as:  FLAGYL      TAKE these medications        cloNIDine 0.2 MG tablet  Commonly known as:  CATAPRES  Take 1 tablet (0.2 mg total) by mouth 2 (two) times daily.     hydrALAZINE 25 MG tablet  Commonly known as:  APRESOLINE  Take 1 tablet (25 mg total) by mouth 2 (two) times daily.     polyethylene glycol packet  Commonly known as:  MIRALAX / GLYCOLAX  Take 17 g by mouth daily.           Follow-up Information    Follow up with Hvozdovic, Tollie PizzaLori P, PA-C On 01/28/2015.   Specialty:  Gastroenterology   Why:  appt at 1:15--please be at office for 1:00.   Contact information:   [address] [place] [phone]       Schedule an appointment as soon as possible for a visit with Myrlene BrokerElizabeth A Crawford, MD.   Specialty:  Internal Medicine   Contact information:   [address] [place] [phone]        The results of significant diagnostics from this hospitalization (including imaging, microbiology, ancillary and laboratory) are listed below for reference.     Microbiology: Recent Results (from the past 240 hour(s))  MRSA PCR Screening     Status: None   Collection Time: 01/06/15 11:37 AM  Result Value Ref Range Status   MRSA by PCR NEGATIVE NEGATIVE Final    Comment:        The GeneXpert MRSA Assay (FDA approved for NASAL specimens only), is one component of a comprehensive MRSA colonization surveillance program. It is not intended to diagnose MRSA infection nor to guide or monitor treatment for MRSA infections.      Labs: Basic Metabolic Panel:  Recent Labs Lab 01/06/15 0330 01/07/15 0350 01/08/15 0335  NA 139 138 137  K 3.7 3.2* 3.3*  CL 105 101 102  CO2 21* 27 27  GLUCOSE 139* 115* 106*  BUN 14 11 12   CREATININE 0.98 0.94 0.95  CALCIUM 9.5 9.2 8.9  MG 1.9  --   --    Liver Function Tests:  Recent Labs Lab  01/06/15 0330  AST 36  ALT 16*  ALKPHOS 56  BILITOT 1.7*  PROT 9.3*  ALBUMIN 4.9    Recent Labs Lab 01/06/15 0330  LIPASE 24   No results for input(s): AMMONIA in the last 168 hours. CBC:  Recent Labs Lab 01/06/15 0330 01/07/15 0350 01/08/15 0335  WBC 10.6* 9.9 6.1  NEUTROABS 6.6  --   --   HGB 16.1 14.0 13.2  HCT 45.1 40.1 38.2*  MCV 96.6 97.1 97.4  PLT 229 249 232   SIGNED: Time coordinating discharge:  30 minutes  Debbora Presto, MD  [facility] 01/08/2015, 11:58 AM Pager [phone]  If 7PM-7AM, please contact night-coverage [email or web address] Password [email or web address]

## 2015-01-09 LAB — ALDOSTERONE + RENIN ACTIVITY W/ RATIO: Aldosterone: 1.4 ng/dL (ref 0.0–30.0)

## 2015-01-28 ENCOUNTER — Ambulatory Visit: Payer: 59 | Admitting: Internal Medicine

## 2015-01-28 ENCOUNTER — Ambulatory Visit: Payer: 59 | Admitting: Physician Assistant

## 2015-02-10 ENCOUNTER — Ambulatory Visit: Payer: 59 | Admitting: Physician Assistant

## 2015-02-13 ENCOUNTER — Encounter: Payer: Self-pay | Admitting: Internal Medicine

## 2015-02-13 ENCOUNTER — Ambulatory Visit (INDEPENDENT_AMBULATORY_CARE_PROVIDER_SITE_OTHER): Payer: 59 | Admitting: Internal Medicine

## 2015-02-13 ENCOUNTER — Other Ambulatory Visit (INDEPENDENT_AMBULATORY_CARE_PROVIDER_SITE_OTHER): Payer: 59

## 2015-02-13 VITALS — BP 180/110 | HR 80 | Temp 98.8°F | Resp 14 | Ht 73.0 in | Wt 194.8 lb

## 2015-02-13 DIAGNOSIS — Z23 Encounter for immunization: Secondary | ICD-10-CM

## 2015-02-13 DIAGNOSIS — E876 Hypokalemia: Secondary | ICD-10-CM

## 2015-02-13 DIAGNOSIS — I10 Essential (primary) hypertension: Secondary | ICD-10-CM

## 2015-02-13 DIAGNOSIS — Z720 Tobacco use: Secondary | ICD-10-CM

## 2015-02-13 LAB — COMPREHENSIVE METABOLIC PANEL
ALT: 7 U/L (ref 0–53)
AST: 10 U/L (ref 0–37)
Albumin: 4.1 g/dL (ref 3.5–5.2)
Alkaline Phosphatase: 50 U/L (ref 39–117)
BILIRUBIN TOTAL: 0.5 mg/dL (ref 0.2–1.2)
BUN: 10 mg/dL (ref 6–23)
CALCIUM: 9.5 mg/dL (ref 8.4–10.5)
CHLORIDE: 104 meq/L (ref 96–112)
CO2: 30 meq/L (ref 19–32)
Creatinine, Ser: 0.9 mg/dL (ref 0.40–1.50)
GFR: 116.96 mL/min (ref >60.00)
GLUCOSE: 109 mg/dL — AB (ref 70–99)
POTASSIUM: 4.1 meq/L (ref 3.5–5.1)
Sodium: 140 mEq/L (ref 135–145)
Total Protein: 7.8 g/dL (ref 6.0–8.3)

## 2015-02-13 MED ORDER — CLONIDINE HCL 0.2 MG PO TABS: 0.2000 mg | ORAL_TABLET | Freq: Two times a day (BID) | ORAL | Status: DC

## 2015-02-13 MED ORDER — HYDRALAZINE HCL 25 MG PO TABS: 25.0000 mg | ORAL_TABLET | Freq: Three times a day (TID) | ORAL | Status: DC

## 2015-02-13 NOTE — Patient Instructions (Signed)
We are checking the blood work today and will call you back about the results.   We would like you to start taking the hydralazine 3 times per day. Keep taking the clonidine twice a day.   Think about stopping smoking as this is worsening your blood pressure and increases your risk for heart attack in the future.   Hypertension Hypertension, commonly called high blood pressure, is when the force of blood pumping through your arteries is too strong. Your arteries are the blood vessels that carry blood from your heart throughout your body. A blood pressure reading consists of a higher number over a lower number, such as 110/72. The higher number (systolic) is the pressure inside your arteries when your heart pumps. The lower number (diastolic) is the pressure inside your arteries when your heart relaxes. Ideally you want your blood pressure below 120/80. Hypertension forces your heart to work harder to pump blood. Your arteries may become narrow or stiff. Having untreated or uncontrolled hypertension can cause heart attack, stroke, kidney disease, and other problems. RISK FACTORS Some risk factors for high blood pressure are controllable. Others are not.  Risk factors you cannot control include:   Race. You may be at higher risk if you are African American.  Age. Risk increases with age.  Gender. Men are at higher risk than women before age 45 years. After age 45, women are at higher risk than men. Risk factors you can control include:  Not getting enough exercise or physical activity.  Being overweight.  Getting too much fat, sugar, calories, or salt in your diet.  Drinking too much alcohol. SIGNS AND SYMPTOMS Hypertension does not usually cause signs or symptoms. Extremely high blood pressure (hypertensive crisis) may cause headache, anxiety, shortness of breath, and nosebleed. DIAGNOSIS To check if you have hypertension, your health care provider will measure your blood pressure while  you are seated, with your arm held at the level of your heart. It should be measured at least twice using the same arm. Certain conditions can cause a difference in blood pressure between your right and left arms. A blood pressure reading that is higher than normal on one occasion does not mean that you need treatment. If it is not clear whether you have high blood pressure, you may be asked to return on a different day to have your blood pressure checked again. Or, you may be asked to monitor your blood pressure at home for 1 or more weeks. TREATMENT Treating high blood pressure includes making lifestyle changes and possibly taking medicine. Living a healthy lifestyle can help lower high blood pressure. You may need to change some of your habits. Lifestyle changes may include:  Following the DASH diet. This diet is high in fruits, vegetables, and whole grains. It is low in salt, red meat, and added sugars.  Keep your sodium intake below 2,300 mg per day.  Getting at least 30-45 minutes of aerobic exercise at least 4 times per week.  Losing weight if necessary.  Not smoking.  Limiting alcoholic beverages.  Learning ways to reduce stress. Your health care provider may prescribe medicine if lifestyle changes are not enough to get your blood pressure under control, and if one of the following is true:  You are 4818-45 years of age and your systolic blood pressure is above 140.  You are 45 years of age or older, and your systolic blood pressure is above 150.  Your diastolic blood pressure is above 90.  You  have diabetes, and your systolic blood pressure is over 140 or your diastolic blood pressure is over 90.  You have kidney disease and your blood pressure is above 140/90.  You have heart disease and your blood pressure is above 140/90. Your personal target blood pressure may vary depending on your medical conditions, your age, and other factors. HOME CARE INSTRUCTIONS  Have your blood  pressure rechecked as directed by your health care provider.   Take medicines only as directed by your health care provider. Follow the directions carefully. Blood pressure medicines must be taken as prescribed. The medicine does not work as well when you skip doses. Skipping doses also puts you at risk for problems.  Do not smoke.   Monitor your blood pressure at home as directed by your health care provider. SEEK MEDICAL CARE IF:   You think you are having a reaction to medicines taken.  You have recurrent headaches or feel dizzy.  You have swelling in your ankles.  You have trouble with your vision. SEEK IMMEDIATE MEDICAL CARE IF:  You develop a severe headache or confusion.  You have unusual weakness, numbness, or feel faint.  You have severe chest or abdominal pain.  You vomit repeatedly.  You have trouble breathing. MAKE SURE YOU:   Understand these instructions.  Will watch your condition.  Will get help right away if you are not doing well or get worse.   This information is not intended to replace advice given to you by your health care provider. Make sure you discuss any questions you have with your health care provider.   Document Released: 02/28/2005 Document Revised: 07/15/2014 Document Reviewed: 12/21/2012 Elsevier Interactive Patient Education [employer]

## 2015-02-13 NOTE — Progress Notes (Signed)
   Subjective:    Patient ID: Anthony Atkinson, male    DOB: 11/27/1970, 44 y.o.   MRN: [id number]  HPI The patient is a 44 YO man coming in new for his blood pressure. He was hospitalized several times in the last 3 months for his blood pressure and malignant hypertension. He has not had regular medical care and his blood pressure has been mildly high for some time but not this bad. He is taking clonidine and hydralazine both BID and is taking them. He has taken both today. Generally his blood pressure is good in the morning and worse in the afternoon or evening. No headaches, chest pains, nausea, abdominal pain, vomiting. He denies any other complaints. He recently had oral surgery for a cyst and is taking doxycycline for several more days. He was doing well after leaving the hospital but started smoking again and it has gone up.   [facility], social history reviewed and updated.   Review of Systems  Constitutional: Negative for fever, activity change, appetite change, fatigue and unexpected weight change.  HENT: Negative.   Eyes: Negative.   Respiratory: Negative for cough, chest tightness, shortness of breath and wheezing.   Cardiovascular: Negative for chest pain, palpitations and leg swelling.  Gastrointestinal: Negative for nausea, abdominal pain, diarrhea, constipation and abdominal distention.  Genitourinary: Negative.   Musculoskeletal: Negative.   Skin: Negative.   Neurological: Negative for dizziness, syncope, facial asymmetry, speech difficulty, weakness and headaches.  Psychiatric/Behavioral: Negative.       Objective:   Physical Exam  Constitutional: He is oriented to person, place, and time. He appears well-developed and well-nourished.  HENT:  Head: Normocephalic and atraumatic.  Swelling left lower jaw  Eyes: EOM are normal.  Neck: Normal range of motion.  Cardiovascular: Normal rate and regular rhythm.   Pulmonary/Chest: Effort normal and breath sounds normal. No  respiratory distress. He has no wheezes. He has no rales.  Abdominal: Soft. Bowel sounds are normal. He exhibits no distension. There is no tenderness. There is no rebound.  Musculoskeletal: He exhibits no edema.  Neurological: He is alert and oriented to person, place, and time. Coordination normal.  Skin: Skin is warm and dry.  Psychiatric: He has a normal mood and affect.   Filed Vitals:   02/13/15 1447 02/13/15 1522  BP: 180/102 180/110  Pulse: 80   Temp: 98.8 F (37.1 C)   TempSrc: Oral   Resp: 14   Height: 6\' 1"  (1.854 m)   Weight: 194 lb 12.8 oz (88.361 kg)   SpO2: 98%       Assessment & Plan:  Tdap given at visit.

## 2015-02-13 NOTE — Assessment & Plan Note (Signed)
He is smoking again and have asked him to stop. He is not willing to stop right now.

## 2015-02-13 NOTE — Assessment & Plan Note (Signed)
He is currently taking hydralazine and clonidine BID with severely elevated pressures. Talked to him about smoking cessation since this is worsening his problem. He is not sure he can quit now. Increasing hydralazine to TID and if BP does not come down will add 3rd agent. Likely they were unable to add diuretic in the hospital due to low potassium from GI symptoms. Checking CMP today and adjust as needed. If not controlled with diuretic he needs evaluation for secondary causes of hypertension and hyperaldo is most likely given his low K off diuretic.

## 2015-02-13 NOTE — Progress Notes (Signed)
Pre visit review using our clinic review tool, if applicable. No additional management support is needed unless otherwise documented below in the visit note. 

## 2015-04-16 ENCOUNTER — Ambulatory Visit (INDEPENDENT_AMBULATORY_CARE_PROVIDER_SITE_OTHER): Payer: 59 | Admitting: Internal Medicine

## 2015-04-16 ENCOUNTER — Encounter: Payer: Self-pay | Admitting: Internal Medicine

## 2015-04-16 VITALS — BP 220/108 | HR 73 | Temp 98.4°F | Resp 16 | Ht 73.0 in | Wt 195.0 lb

## 2015-04-16 DIAGNOSIS — I10 Essential (primary) hypertension: Secondary | ICD-10-CM | POA: Diagnosis not present

## 2015-04-16 MED ORDER — LISINOPRIL-HYDROCHLOROTHIAZIDE 20-25 MG PO TABS: 1.0000 | ORAL_TABLET | Freq: Every day | ORAL | Status: DC

## 2015-04-16 NOTE — Progress Notes (Signed)
   Subjective:    Patient ID: Anthony Atkinson, male    DOB: 1970-05-23, 45 y.o.   MRN: [id number]  HPI The patient is a 45 YO man coming in for follow up of his blood pressure. He has increase his hydralazine to TID and kept the clonidine BID. He states that his blood pressure is 140s/90s at home. Had a migraine yesterday and has had since then. Denies chest pains, abdominal pain. No nausea or vomiting. No confusion or weakness or numbness.   Review of Systems  Constitutional: Negative for fever, activity change, appetite change, fatigue and unexpected weight change.  HENT: Negative.   Eyes: Negative.   Respiratory: Negative for cough, chest tightness, shortness of breath and wheezing.   Cardiovascular: Negative for chest pain, palpitations and leg swelling.  Gastrointestinal: Negative for nausea, abdominal pain, diarrhea, constipation and abdominal distention.  Genitourinary: Negative.   Musculoskeletal: Negative.   Skin: Negative.   Neurological: Negative for dizziness, syncope, facial asymmetry, speech difficulty, weakness and headaches.  Psychiatric/Behavioral: Negative.       Objective:   Physical Exam  Constitutional: He is oriented to person, place, and time. He appears well-developed and well-nourished.  HENT:  Head: Normocephalic and atraumatic.  Eyes: EOM are normal.  Neck: Normal range of motion.  Cardiovascular: Normal rate and regular rhythm.   Pulmonary/Chest: Effort normal and breath sounds normal. No respiratory distress. He has no wheezes. He has no rales.  Abdominal: Soft. Bowel sounds are normal. He exhibits no distension. There is no tenderness. There is no rebound.  Musculoskeletal: He exhibits no edema.  Neurological: He is alert and oriented to person, place, and time. Coordination normal.  Skin: Skin is warm and dry.  Psychiatric: He has a normal mood and affect.   Filed Vitals:   04/16/15 0931  BP: 230/110  Pulse: 73  Temp: 98.4 F (36.9 C)  TempSrc:  Oral  Resp: 16  Height:  (1.854 m)  Weight: 195 lb (88.451 kg)  SpO2: 98%   EKG: rate 71, axis normal, intervals normal. No ST or t wave change, no change from prior.    Assessment & Plan:

## 2015-04-16 NOTE — Patient Instructions (Signed)
We have sent in lisinopril/hctz to the pharmacy which is a medicine you take once a day for the blood pressure. Keep taking the hydralazine for the next 3 days to help it get in your system.   Today take hydralazine at 11AM, 3PM, and evening. Tomorrow and the next several days take it morning, 2PM, evening. It is okay if it is close to that time.   If you are still having a headache tonight and the blood pressure is still high in the morning it might be a good idea to go to the ER to check on the head.

## 2015-04-16 NOTE — Assessment & Plan Note (Signed)
Recheck by MD during visit not improved, he has taken his hydralazine and clonidine already today. Will add extra dose of hydralazine today and sent in lisinopril/hctz to start taking today. In 3 days he will call with pressures and if controlled will continue clonidine and lisinopril/hctz. See back quickly. If no improvement or worsening of headache, if any numbness or weakness he needs to go to ER immediately.

## 2015-04-16 NOTE — Progress Notes (Signed)
Pre visit review using our clinic review tool, if applicable. No additional management support is needed unless otherwise documented below in the visit note. 

## 2015-04-23 ENCOUNTER — Inpatient Hospital Stay (HOSPITAL_COMMUNITY): Payer: 59

## 2015-04-23 ENCOUNTER — Emergency Department (HOSPITAL_COMMUNITY): Payer: 59

## 2015-04-23 ENCOUNTER — Encounter (HOSPITAL_COMMUNITY): Payer: Self-pay

## 2015-04-23 ENCOUNTER — Inpatient Hospital Stay (HOSPITAL_COMMUNITY)
Admission: EM | Admit: 2015-04-23 | Discharge: 2015-04-24 | DRG: 201 | Discharge status: Against Medical Advice | Payer: 59 | Attending: Internal Medicine | Admitting: Internal Medicine

## 2015-04-23 DIAGNOSIS — R197 Diarrhea, unspecified: Secondary | ICD-10-CM | POA: Diagnosis present

## 2015-04-23 DIAGNOSIS — D72829 Elevated white blood cell count, unspecified: Secondary | ICD-10-CM | POA: Diagnosis present

## 2015-04-23 DIAGNOSIS — I10 Essential (primary) hypertension: Secondary | ICD-10-CM | POA: Diagnosis present

## 2015-04-23 DIAGNOSIS — F17210 Nicotine dependence, cigarettes, uncomplicated: Secondary | ICD-10-CM | POA: Diagnosis present

## 2015-04-23 DIAGNOSIS — Z79899 Other long term (current) drug therapy: Secondary | ICD-10-CM | POA: Diagnosis not present

## 2015-04-23 DIAGNOSIS — R0602 Shortness of breath: Secondary | ICD-10-CM | POA: Diagnosis not present

## 2015-04-23 DIAGNOSIS — E876 Hypokalemia: Secondary | ICD-10-CM | POA: Diagnosis present

## 2015-04-23 DIAGNOSIS — J982 Interstitial emphysema: Secondary | ICD-10-CM | POA: Diagnosis present

## 2015-04-23 DIAGNOSIS — R112 Nausea with vomiting, unspecified: Secondary | ICD-10-CM | POA: Diagnosis present

## 2015-04-23 DIAGNOSIS — K223 Perforation of esophagus: Secondary | ICD-10-CM | POA: Diagnosis not present

## 2015-04-23 LAB — BLOOD GAS, VENOUS
Acid-Base Excess: 3.7 mmol/L — ABNORMAL HIGH (ref 0.0–2.0)
Bicarbonate: 25.5 mEq/L — ABNORMAL HIGH (ref 20.0–24.0)
O2 SAT: 85.2 %
PCO2 VEN: 32 mmHg — AB (ref 45.0–50.0)
PH VEN: 7.512 — AB (ref 7.250–7.300)
PO2 VEN: 47.5 mmHg — AB (ref 30.0–45.0)
Patient temperature: 37
TCO2: 20.8 mmol/L (ref 0–100)

## 2015-04-23 LAB — COMPREHENSIVE METABOLIC PANEL
ALT: 14 U/L — ABNORMAL LOW (ref 17–63)
AST: 24 U/L (ref 15–41)
Albumin: 5.7 g/dL — ABNORMAL HIGH (ref 3.5–5.0)
Alkaline Phosphatase: 62 U/L (ref 38–126)
Anion gap: 17 — ABNORMAL HIGH (ref 5–15)
BUN: 20 mg/dL (ref 6–20)
CHLORIDE: 100 mmol/L — AB (ref 101–111)
CO2: 23 mmol/L (ref 22–32)
Calcium: 10.6 mg/dL — ABNORMAL HIGH (ref 8.9–10.3)
Creatinine, Ser: 1.12 mg/dL (ref 0.61–1.24)
Glucose, Bld: 185 mg/dL — ABNORMAL HIGH (ref 65–99)
POTASSIUM: 3.4 mmol/L — AB (ref 3.5–5.1)
SODIUM: 140 mmol/L (ref 135–145)
Total Bilirubin: 1.5 mg/dL — ABNORMAL HIGH (ref 0.3–1.2)
Total Protein: 10 g/dL — ABNORMAL HIGH (ref 6.5–8.1)

## 2015-04-23 LAB — URINALYSIS, ROUTINE W REFLEX MICROSCOPIC
GLUCOSE, UA: 250 mg/dL — AB
Ketones, ur: NEGATIVE mg/dL
LEUKOCYTES UA: NEGATIVE
Nitrite: NEGATIVE
PH: 6 (ref 5.0–8.0)
Protein, ur: >300 mg/dL — AB

## 2015-04-23 LAB — LIPASE, BLOOD: LIPASE: 18 U/L (ref 11–51)

## 2015-04-23 LAB — RAPID URINE DRUG SCREEN, HOSP PERFORMED
AMPHETAMINES: NOT DETECTED
Barbiturates: NOT DETECTED
Benzodiazepines: NOT DETECTED
Cocaine: NOT DETECTED
Opiates: POSITIVE — AB
Tetrahydrocannabinol: POSITIVE — AB

## 2015-04-23 LAB — CBC
HEMATOCRIT: 47.4 % (ref 39.0–52.0)
Hemoglobin: 16.4 g/dL (ref 13.0–17.0)
MCH: 33 pg (ref 26.0–34.0)
MCHC: 34.6 g/dL (ref 30.0–36.0)
MCV: 95.4 fL (ref 78.0–100.0)
Platelets: 266 10*3/uL (ref 150–400)
RBC: 4.97 MIL/uL (ref 4.22–5.81)
RDW: 13.3 % (ref 11.5–15.5)
WBC: 23.7 10*3/uL — AB (ref 4.0–10.5)

## 2015-04-23 LAB — OCCULT BLOOD GASTRIC / DUODENUM (SPECIMEN CUP)
OCCULT BLOOD, GASTRIC: POSITIVE — AB
PH, GASTRIC: 2

## 2015-04-23 LAB — URINE MICROSCOPIC-ADD ON

## 2015-04-23 LAB — I-STAT CG4 LACTIC ACID, ED
Lactic Acid, Venous: 3.66 mmol/L (ref 0.5–2.0)
Lactic Acid, Venous: 3.61 mmol/L (ref 0.5–2.0)

## 2015-04-23 LAB — PROCALCITONIN

## 2015-04-23 LAB — AMYLASE: Amylase: 50 U/L (ref 28–100)

## 2015-04-23 LAB — TROPONIN I: Troponin I: <0.03 ng/mL (ref <0.031)

## 2015-04-23 LAB — LACTIC ACID, PLASMA: LACTIC ACID, VENOUS: 2 mmol/L (ref 0.5–2.0)

## 2015-04-23 MED ORDER — HYDROMORPHONE HCL 1 MG/ML IJ SOLN
1.0000 mg | Freq: Once | INTRAMUSCULAR | Status: AC
  Administered 2015-04-23: 1 mg via INTRAVENOUS
  Filled 2015-04-23: qty 1

## 2015-04-23 MED ORDER — SODIUM CHLORIDE 0.9 % IV SOLN: 250.0000 mL | INTRAVENOUS | Status: DC | PRN

## 2015-04-23 MED ORDER — METOPROLOL TARTRATE 1 MG/ML IV SOLN
10.0000 mg | Freq: Once | INTRAVENOUS | Status: AC
  Administered 2015-04-23: 10 mg via INTRAVENOUS
  Filled 2015-04-23: qty 10

## 2015-04-23 MED ORDER — METOPROLOL TARTRATE 1 MG/ML IV SOLN
5.0000 mg | Freq: Once | INTRAVENOUS | Status: AC
  Administered 2015-04-23: 5 mg via INTRAVENOUS
  Filled 2015-04-23: qty 5

## 2015-04-23 MED ORDER — PIPERACILLIN-TAZOBACTAM 3.375 G IVPB
3.3750 g | Freq: Three times a day (TID) | INTRAVENOUS | Status: DC
  Administered 2015-04-24 (×2): 3.375 g via INTRAVENOUS
  Filled 2015-04-23 (×5): qty 50

## 2015-04-23 MED ORDER — PIPERACILLIN-TAZOBACTAM 3.375 G IVPB
3.3750 g | Freq: Once | INTRAVENOUS | Status: AC
  Administered 2015-04-23: 3.375 g via INTRAVENOUS
  Filled 2015-04-23: qty 50

## 2015-04-23 MED ORDER — SODIUM CHLORIDE 0.9 % IV BOLUS (SEPSIS): 2.0000 mL | Freq: Once | INTRAVENOUS | Status: DC

## 2015-04-23 MED ORDER — IOHEXOL 300 MG/ML  SOLN
25.0000 mL | Freq: Once | INTRAMUSCULAR | Status: AC | PRN
  Administered 2015-04-23: 25 mL via ORAL

## 2015-04-23 MED ORDER — ONDANSETRON 4 MG PO TBDP
4.0000 mg | ORAL_TABLET | Freq: Once | ORAL | Status: AC | PRN
  Administered 2015-04-23: 4 mg via ORAL
  Filled 2015-04-23: qty 1

## 2015-04-23 MED ORDER — VANCOMYCIN HCL IN DEXTROSE 1-5 GM/200ML-% IV SOLN
1000.0000 mg | Freq: Three times a day (TID) | INTRAVENOUS | Status: DC
  Administered 2015-04-23 – 2015-04-24 (×2): 1000 mg via INTRAVENOUS
  Filled 2015-04-23 (×4): qty 200

## 2015-04-23 MED ORDER — ONDANSETRON HCL 4 MG/2ML IJ SOLN
4.0000 mg | Freq: Once | INTRAMUSCULAR | Status: AC
  Administered 2015-04-23: 4 mg via INTRAVENOUS
  Filled 2015-04-23: qty 2

## 2015-04-23 MED ORDER — SODIUM CHLORIDE 0.9 % IV SOLN
INTRAVENOUS | Status: DC
  Administered 2015-04-23: 16:00:00 via INTRAVENOUS

## 2015-04-23 MED ORDER — IOHEXOL 300 MG/ML  SOLN
100.0000 mL | Freq: Once | INTRAMUSCULAR | Status: AC | PRN
  Administered 2015-04-23: 100 mL via INTRAVENOUS

## 2015-04-23 MED ORDER — HYDRALAZINE HCL 25 MG PO TABS
25.0000 mg | ORAL_TABLET | Freq: Three times a day (TID) | ORAL | Status: DC
  Administered 2015-04-23 – 2015-04-24 (×3): 25 mg via ORAL
  Filled 2015-04-23 (×3): qty 1

## 2015-04-23 MED ORDER — SODIUM CHLORIDE 0.9 % IV BOLUS (SEPSIS)
2000.0000 mL | Freq: Once | INTRAVENOUS | Status: AC
  Administered 2015-04-23: 2000 mL via INTRAVENOUS

## 2015-04-23 MED ORDER — HYDROMORPHONE HCL 2 MG/ML IJ SOLN
2.0000 mg | Freq: Once | INTRAMUSCULAR | Status: AC
  Administered 2015-04-23: 2 mg via INTRAVENOUS
  Filled 2015-04-23: qty 1

## 2015-04-23 MED ORDER — METOCLOPRAMIDE HCL 5 MG/ML IJ SOLN
10.0000 mg | Freq: Once | INTRAMUSCULAR | Status: AC
  Administered 2015-04-23: 10 mg via INTRAVENOUS
  Filled 2015-04-23: qty 2

## 2015-04-23 MED ORDER — FLUCONAZOLE IN SODIUM CHLORIDE 400-0.9 MG/200ML-% IV SOLN
400.0000 mg | INTRAVENOUS | Status: DC
  Administered 2015-04-23: 400 mg via INTRAVENOUS
  Filled 2015-04-23 (×2): qty 200

## 2015-04-23 MED ORDER — IOHEXOL 300 MG/ML  SOLN
75.0000 mL | Freq: Once | INTRAMUSCULAR | Status: AC | PRN
  Administered 2015-04-23: 75 mL via INTRAVENOUS

## 2015-04-23 MED ORDER — POTASSIUM CHLORIDE CRYS ER 20 MEQ PO TBCR
40.0000 meq | EXTENDED_RELEASE_TABLET | Freq: Once | ORAL | Status: AC
  Administered 2015-04-23: 40 meq via ORAL
  Filled 2015-04-23: qty 2

## 2015-04-23 MED ORDER — PANTOPRAZOLE SODIUM 40 MG IV SOLR
40.0000 mg | Freq: Every day | INTRAVENOUS | Status: DC
  Administered 2015-04-23: 40 mg via INTRAVENOUS
  Filled 2015-04-23: qty 40

## 2015-04-23 MED ORDER — CLONIDINE HCL 0.1 MG PO TABS
0.2000 mg | ORAL_TABLET | Freq: Two times a day (BID) | ORAL | Status: DC
  Administered 2015-04-23 – 2015-04-24 (×2): 0.2 mg via ORAL
  Filled 2015-04-23 (×2): qty 2

## 2015-04-23 MED ORDER — PROMETHAZINE HCL 25 MG/ML IJ SOLN
25.0000 mg | Freq: Once | INTRAMUSCULAR | Status: AC
  Administered 2015-04-23: 25 mg via INTRAVENOUS
  Filled 2015-04-23: qty 1

## 2015-04-23 MED ORDER — STERILE WATER FOR INJECTION IJ SOLN
INTRAMUSCULAR | Status: AC
  Administered 2015-04-23: 10 mL
  Filled 2015-04-23: qty 10

## 2015-04-23 MED ORDER — METOPROLOL TARTRATE 1 MG/ML IV SOLN
5.0000 mg | Freq: Once | INTRAVENOUS | Status: AC
Start: 2015-04-23 — End: 2015-04-23
  Administered 2015-04-23: 5 mg via INTRAVENOUS
  Filled 2015-04-23: qty 5

## 2015-04-23 NOTE — ED Notes (Signed)
Admitting Doctor at bedside 

## 2015-04-23 NOTE — ED Notes (Signed)
Pt transported to CT ?

## 2015-04-23 NOTE — ED Notes (Signed)
Pt's wife is very upset that his BP remains high.  Consulting civil engineer and this RN explained that tests have been performed to hopefully determine cause of symptoms and that MD has ordered meds for BP.  MD is currently at the bedside answering pt questions.

## 2015-04-23 NOTE — ED Provider Notes (Signed)
CSN: [id number]     Arrival date & time 04/23/15  1417 History   First MD Initiated Contact with Patient 04/23/15 [phone]     Chief Complaint  Patient presents with  . Abdominal Pain  . Emesis      HPI Pt c/o generalized abdominal cramping, n/v/d, and generalized lower body aches starting last night. Pain score 10/10. Pt's wife reports the Pt was recently admitted for similar symptoms w/o diagnosis.  Past Medical History  Diagnosis Date  . Hypertension    Past Surgical History  Procedure Laterality Date  . Facial cosmetic surgery      cysts removed   History reviewed. No pertinent family history. Social History  Substance Use Topics  . Smoking status: Current Every Day Smoker -- 1.00 packs/day    Types: Cigarettes  . Smokeless tobacco: None  . Alcohol Use: Yes     Comment: socially    Review of Systems  Constitutional: Negative for fever.  Gastrointestinal: Positive for vomiting and diarrhea.  All other systems reviewed and are negative.     Allergies  Review of patient's allergies indicates no known allergies.  Home Medications   Prior to Admission medications   Medication Sig Start Date End Date Taking? Authorizing Provider  cloNIDine (CATAPRES) 0.2 MG tablet Take 1 tablet (0.2 mg total) by mouth 2 (two) times daily. 02/13/15  Yes Myrlene Broker, MD  lisinopril-hydrochlorothiazide (PRINZIDE,ZESTORETIC) 20-25 MG tablet Take 1 tablet by mouth daily. 04/16/15  Yes Myrlene Broker, MD  hydrALAZINE (APRESOLINE) 25 MG tablet Take 1 tablet (25 mg total) by mouth 3 (three) times daily. Patient not taking: Reported on 04/23/2015 02/13/15   Myrlene Broker, MD   BP 107/58 mmHg  Pulse 99  Temp(Src) 99.2 F (37.3 C) (Oral)  Resp 19  Ht  (1.854 m)  Wt 177 lb 4 oz (80.4 kg)  BMI 23.39 kg/m2  SpO2 99% Physical Exam  Constitutional: He is oriented to person, place, and time. He appears well-developed and well-nourished. No distress.  HENT:  Head:  Normocephalic and atraumatic.  Eyes: Pupils are equal, round, and reactive to light.  Neck: Normal range of motion.  Cardiovascular: Normal rate and intact distal pulses.   Pulmonary/Chest: No respiratory distress.  Abdominal: Normal appearance. He exhibits no distension. There is generalized tenderness. There is no rebound and no guarding.  Musculoskeletal: Normal range of motion.  Neurological: He is alert and oriented to person, place, and time. No cranial nerve deficit.  Skin: Skin is warm and dry. No rash noted.  Psychiatric: He has a normal mood and affect. His behavior is normal.  Nursing note and vitals reviewed.   ED Course  Procedures (including critical care time)  CRITICAL CARE Performed by: Nelva Nay L Total critical care time: 30 minutes Critical care time was exclusive of separately billable procedures and treating other patients. Critical care was necessary to treat or prevent imminent or life-threatening deterioration. Critical care was time spent personally by me on the following activities: development of treatment plan with patient and/or surrogate as well as nursing, discussions with consultants, evaluation of patient's response to treatment, examination of patient, obtaining history from patient or surrogate, ordering and performing treatments and interventions, ordering and review of laboratory studies, ordering and review of radiographic studies, pulse oximetry and re-evaluation of patient's condition.  Medications  ondansetron (ZOFRAN-ODT) disintegrating tablet 4 mg (4 mg Oral Given 04/23/15 1438)  HYDROmorphone (DILAUDID) injection 1 mg (1 mg Intravenous Given 04/23/15 1532)  ondansetron (  ZOFRAN) injection 4 mg (4 mg Intravenous Given 04/23/15 1532)  metoprolol (LOPRESSOR) injection 5 mg (5 mg Intravenous Given 04/23/15 1532)  iohexol (OMNIPAQUE) 300 MG/ML solution 25 mL (25 mLs Oral Contrast Given 04/23/15 1551)  iohexol (OMNIPAQUE) 300 MG/ML solution 100 mL (100 mLs  Intravenous Contrast Given 04/23/15 1637)  HYDROmorphone (DILAUDID) injection 1 mg (1 mg Intravenous Given 04/23/15 1712)  metoprolol (LOPRESSOR) injection 5 mg (5 mg Intravenous Given 04/23/15 1711)  piperacillin-tazobactam (ZOSYN) IVPB 3.375 g (0 g Intravenous Stopped 04/24/15 0003)  metoCLOPramide (REGLAN) injection 10 mg (10 mg Intravenous Given 04/23/15 1731)  iohexol (OMNIPAQUE) 300 MG/ML solution 75 mL (75 mLs Intravenous Contrast Given 04/23/15 1747)  HYDROmorphone (DILAUDID) injection 2 mg (2 mg Intravenous Given 04/23/15 1859)  promethazine (PHENERGAN) injection 25 mg (25 mg Intravenous Given 04/23/15 1926)  metoprolol (LOPRESSOR) injection 10 mg (10 mg Intravenous Given 04/23/15 1859)  sodium chloride 0.9 % bolus 2,000 mL (0 mLs Intravenous Stopped 04/24/15 0004)  potassium chloride SA (K-DUR,KLOR-CON) CR tablet 40 mEq (40 mEq Oral Given 04/23/15 2300)  sterile water (preservative free) injection (10 mLs  Given 04/23/15 2300)  ondansetron (ZOFRAN) injection 4 mg (4 mg Intravenous Given 04/23/15 2347)  ondansetron (ZOFRAN) injection 4 mg (4 mg Intravenous Given 04/24/15 0039)  hydrALAZINE (APRESOLINE) injection 10 mg (10 mg Intravenous Given 04/24/15 0256)    Labs Review Labs Reviewed  COMPREHENSIVE METABOLIC PANEL - Abnormal; Notable for the following:    Potassium 3.4 (*)    Chloride 100 (*)    Glucose, Bld 185 (*)    Calcium 10.6 (*)    Total Protein 10.0 (*)    Albumin 5.7 (*)    ALT 14 (*)    Total Bilirubin 1.5 (*)    Anion gap 17 (*)    All other components within normal limits  CBC - Abnormal; Notable for the following:    WBC 23.7 (*)    All other components within normal limits  URINALYSIS, ROUTINE W REFLEX MICROSCOPIC (NOT AT [facility]) - Abnormal; Notable for the following:    Specific Gravity, Urine >1.046 (*)    Glucose, UA 250 (*)    Hgb urine dipstick SMALL (*)    Bilirubin Urine SMALL (*)    Protein, ur >300 (*)    All other components within normal limits  BLOOD GAS, VENOUS -  Abnormal; Notable for the following:    pH, Ven 7.512 (*)    pCO2, Ven 32.0 (*)    pO2, Ven 47.5 (*)    Bicarbonate 25.5 (*)    Acid-Base Excess 3.7 (*)    All other components within normal limits  OCCULT BLOOD GASTRIC / DUODENUM (SPECIMEN CUP) - Abnormal; Notable for the following:    Occult Blood, Gastric POSITIVE (*)    All other components within normal limits  URINE MICROSCOPIC-ADD ON - Abnormal; Notable for the following:    Squamous Epithelial / LPF 0-5 (*)    Bacteria, UA FEW (*)    Casts HYALINE CASTS (*)    All other components within normal limits  URINE RAPID DRUG SCREEN, HOSP PERFORMED - Abnormal; Notable for the following:    Opiates POSITIVE (*)    Tetrahydrocannabinol POSITIVE (*)    All other components within normal limits  I-STAT CG4 LACTIC ACID, ED - Abnormal; Notable for the following:    Lactic Acid, Venous 3.66 (*)    All other components within normal limits  I-STAT CG4 LACTIC ACID, ED - Abnormal; Notable for the following:  Lactic Acid, Venous 3.61 (*)    All other components within normal limits  CULTURE, BLOOD (ROUTINE X 2)  CULTURE, BLOOD (ROUTINE X 2)  MRSA PCR SCREENING  CULTURE, EXPECTORATED SPUTUM-ASSESSMENT  GASTROINTESTINAL PANEL BY PCR, STOOL (REPLACES STOOL CULTURE)  LIPASE, BLOOD  AMYLASE  LACTIC ACID, PLASMA  TROPONIN I  PROCALCITONIN    Imaging Review Ct Chest Wo Contrast  04/23/2015  CLINICAL DATA:  45 year old male with additional evaluation of the previously seen perforated esophagus. EXAM: CT CHEST WITHOUT CONTRAST TECHNIQUE: Multidetector CT imaging of the chest was performed following the standard protocol without IV contrast. COMPARISON:  Earlier chest CT dated 04/23/2015 FINDINGS: Evaluation is limited due to respiratory motion artifact. The lungs are clear. No pleural effusion or pneumothorax. The central airways are patent. The thoracic aorta and pulmonary arteries are grossly unremarkable on this noncontrast study. There is no  cardiomegaly or pericardial effusion. No hilar or mediastinal adenopathy noted. There is pneumomediastinum tracking of parts to the base of the neck. Oral contrast noted within the esophagus. There is no definite evidence of extravasation of contrast outside of the confine of the esophagus. The thyroid gland appears unremarkable. The chest wall soft tissues and the osseous structures are intact. The visualized upper abdomen appears unremarkable. IMPRESSION: Pneumomediastinum as seen on the prior CT. No definite CT evidence of oral contrast extravasation outside of the esophagus. Electronically Signed   By: Elgie Collard M.D.   On: 04/23/2015 23:34   Ct Chest W Contrast  04/23/2015  ADDENDUM REPORT: 04/23/2015 18:30 ADDENDUM: These results were called by telephone by the PRA at the time of interpretation on 04/23/2015 at 6:29 pm. Electronically Signed   By: Elgie Collard M.D.   On: 04/23/2015 18:30  04/23/2015  CLINICAL DATA:  45 year old male with nausea vomiting concerning for esophageal perforation. EXAM: CT CHEST WITH CONTRAST TECHNIQUE: Multidetector CT imaging of the chest was performed during intravenous contrast administration. CONTRAST:  75mL OMNIPAQUE IOHEXOL 300 MG/ML  SOLN COMPARISON:  Chest radiograph dated 01/06/2015 and abdominal CT dated 04/23/2015 FINDINGS: The lungs are clear. Mild biapical subpleural/paraseptal emphysema. There is no pleural effusion or pneumothorax. The central airways are patent. The thoracic aorta and the central pulmonary arteries appear unremarkable. There is no cardiomegaly or pericardial effusion. No hilar or mediastinal adenopathy. There is moderate pneumomediastinum surrounding the esophagus and tracking upwards to the base of the neck. Findings are concerning for esophageal perforation. There is a small high attenuation material in the subcarinal region which is not well characterized but may represent small lymph nodes or small amount of extravasated oral contrast.  The thyroid gland appears unremarkable. There is no axillary adenopathy. The chest wall soft tissues appear unremarkable. The osseous structures are intact. The visualized upper abdomen the appears unremarkable. IMPRESSION: Pneumomediastinum concerning for esophageal perforation. Trace extravasated oral contrast may be present in the subcarinal region. Electronically Signed: By: Elgie Collard M.D. On: 04/23/2015 18:12   Ct Abdomen Pelvis W Contrast  04/23/2015  CLINICAL DATA:  Abdominal cramping. Nausea and vomiting with bloody emesis. Lower body aches starting last night. EXAM: CT ABDOMEN AND PELVIS WITH CONTRAST TECHNIQUE: Multidetector CT imaging of the abdomen and pelvis was performed using the standard protocol following bolus administration of intravenous contrast. CONTRAST:  OMNIPAQUE IOHEXOL 300 MG/ML  SOLN COMPARISON:  01/06/2015 FINDINGS: Lower chest: There is abnormal gas around the distal esophagus representing pneumomediastinum. Questionable wall thickening in the distal esophagus. Hepatobiliary: 5 mm hypodense lesion in segment 4a of the liver,  stable, highly likely to be benign but technically nonspecific due to small size. Gallbladder unremarkable. Pancreas: Unremarkable Spleen: Unremarkable Adrenals/Urinary Tract: Unremarkable Stomach/Bowel: No dilated bowel.  Appendix normal. Vascular/Lymphatic: Aortoiliac atherosclerotic vascular disease. Reproductive: Unremarkable Other: No supplemental non-categorized findings. Musculoskeletal: Unremarkable IMPRESSION: 1. Pneumomediastinum around the distal esophagus. Distal esophageal wall thickening. 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 2. Mild aortoiliac atherosclerosis. 3. No dilated bowel observed. These results will be called to the ordering clinician or representative by the Radiologist Assistant, and communication documented in the PACS or zVision Dashboard. Electronically Signed    By: Gaylyn Rong M.D.   On: 04/23/2015 17:01   Dg Chest Port 1 View  04/24/2015  CLINICAL DATA:  Intermittent vomiting starting last night. Hypertension. EXAM: PORTABLE CHEST 1 VIEW COMPARISON:  04/23/2015 FINDINGS: Small amount of pneumomediastinum best appreciated in the upper thorax. No pleural effusion. Heart size within normal limits. The lungs appear clear. IMPRESSION: 1. Residual pneumomediastinum most perceptible in the upper thorax. No pleural effusion or pneumothorax. Electronically Signed   By: Gaylyn Rong M.D.   On: 04/24/2015 07:14   I have personally reviewed and evaluated these images and lab results as part of my medical decision-making.   I discussed with Dr. Zenaida Niece try from cardiovascular surgery he reviewed the films.  Recommended intravenous antibiotics and admission to critical care.  Critical care contacted came to emergency department and patient was admitted and transferred to [facility]. MDM   Final diagnoses:  Shortness of breath  Boerhaave syndrome        Nelva Nay, MD 04/25/15 1052

## 2015-04-23 NOTE — ED Notes (Signed)
MD aware of pt's continued hypertension.

## 2015-04-23 NOTE — ED Notes (Signed)
Pt c/o generalized abdominal cramping, n/v/d, and generalized lower body aches starting last night.  Pain score 10/10.  Pt's wife reports the Pt was recently admitted for similar symptoms w/o diagnosis.

## 2015-04-23 NOTE — Progress Notes (Addendum)
ANTIBIOTIC CONSULT NOTE - INITIAL  Pharmacy Consult for vancomycin Indication: r/o sepsis  No Known Allergies  Patient Measurements: weight 88 kg, height 73 inches   Vital Signs: Temp: 97.3 F (36.3 C) (02/09 1433) Temp Source: Oral (02/09 1433) BP: 233/147 mmHg (02/09 1703) Pulse Rate: 69 (02/09 1703) Intake/Output from previous day:   Intake/Output from this shift:    Labs:  Recent Labs  04/23/15 1453  WBC 23.7*  HGB 16.4  PLT 266  CREATININE 1.12   Estimated Creatinine Clearance: 94.1 mL/min (by C-G formula based on Cr of 1.12). No results for input(s): VANCOTROUGH, VANCOPEAK, VANCORANDOM, GENTTROUGH, GENTPEAK, GENTRANDOM, TOBRATROUGH, TOBRAPEAK, TOBRARND, AMIKACINPEAK, AMIKACINTROU, AMIKACIN in the last 72 hours.   Microbiology: No results found for this or any previous visit (from the past 720 hour(s)).  Medical History: Past Medical History  Diagnosis Date  . Hypertension    Assessment: Patient's a 45 y.o M presented to the ED with c/o abdominal pain and n/v.  He was found to have leukocytosis and elevated Lactic acid.  Zosyn 3.375 gm IV x1 given in the ED at 1731. To start vancomycin for broad coverage.  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 To continue with zosyn and add diflucan to abx regimen.  - zosyn 3.375 gm IV q8h infuse over 4 hours (verified with RN, only one dose given in ED) - fluconazole 400 mg IV q24h

## 2015-04-23 NOTE — Progress Notes (Signed)
  Subjective: pneumo-mediastinum in 45 yo BM with recent emesis/wretching Patient examined and CT chest with contrast personally reviewed No complaint of chest pain or sob No pleural effusion  WBC is elevated The amount of air suggests that air originated from lungs- no hx of asthma but + smoker Currently NPO on IV Zosyn/vanco/protonix  Objective: Vital signs in last 24 hours: Temp:  [97.3 F (36.3 C)] 97.3 F (36.3 C) (02/09 1433) Pulse Rate:  [60-82] 79 (02/09 2115) Cardiac Rhythm:  [-]  Resp:  [18-20] 18 (02/09 1900) BP: (146-233)/(89-147) 178/114 mmHg (02/09 2115) SpO2:  [95 %-100 %] 100 % (02/09 2115)  Hemodynamic parameters for last 24 hours:  stable, afebrile  Intake/Output from previous day:   Intake/Output this shift:        Physical Exam  General: middle aged AA male NAD, appears comfortable HEENT: Normocephalic pupils equal , dentition adequate Neck: Supple without JVD, adenopathy, or bruit - no crepitus in neck Chest: Clear to auscultation, symmetrical breath sounds, no rhonchi, no tenderness             or deformity Cardiovascular: Regular rate and rhythm, no murmur, no gallop, peripheral pulses             palpable , no pericardial friction rub Abdomen:  Soft, nontender, no palpable mass or organomegaly Extremities: Warm, well-perfused, no clubbing cyanosis edema or tenderness,              no venous stasis changes of the legs Rectal/GU: Deferred Neuro: Grossly non--focal and symmetrical throughout Skin: Clean and dry without rash or ulceration   Lab Results:  Recent Labs  04/23/15 1453  WBC 23.7*  HGB 16.4  HCT 47.4  PLT 266   BMET:  Recent Labs  04/23/15 1453  NA 140  K 3.4*  CL 100*  CO2 23  GLUCOSE 185*  BUN 20  CREATININE 1.12  CALCIUM 10.6*    PT/INR: No results for input(s): LABPROT, INR in the last 72 hours. ABG    Component Value Date/Time   HCO3 25.5* 04/23/2015 1615   TCO2 20.8 04/23/2015 1615   O2SAT 85.2  04/23/2015 1615   CBG (last 3)  No results for input(s): GLUCAP in the last 72 hours.  Assessment/Plan: Pneumomediastinum with elevated WBC after emesis Could have microperforation, contained Agree with current therapy This should resolve w/o surgery Rec gastrograffin CT  Daily CXR to eval for pleural effusion  LOS: 0 days    Kathlee Nations Trigt III 04/23/2015

## 2015-04-23 NOTE — ED Notes (Signed)
Dr. Radford Pax aware pt lactic acid is 3.61

## 2015-04-23 NOTE — ED Notes (Signed)
MD notified of pt BP.  Orders received.

## 2015-04-23 NOTE — H&P (Signed)
PULMONARY / CRITICAL CARE MEDICINE   Name: Anthony Atkinson MRN: [id number] DOB: [birth year]    ADMISSION DATE:  04/23/2015 CONSULTATION DATE:  04/23/15  REFERRING MD:  EDP  CHIEF COMPLAINT:  Abd pain, N/V/D  HISTORY OF PRESENT ILLNESS:  Anthony Atkinson is a 45 y.o. male with PMH of HTN.  He presented to [facility] ED 02/09 with abd cramping, N/V/D, generalized body aches.  Pt had recently been admitted for similar symptoms but never had a confirmed diagnosis.  While in ED, he had an episode of moderate volume hematemesis.  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  CVTS was apparently contacted and opted to manage conservatively.  PCCM was consulted for medical management.   Pt states he bought a pack of cookie dough which he left in the care for several hours 4 days prior to ED presentation.  The following day, he cooked the cookies and after eating 3 - 4, he immediately became nauseous and had vomiting with diarrhea.  Symptoms resolved on their own on 04/20/14.  PAST MEDICAL HISTORY :  He  has a past medical history of Hypertension.  PAST SURGICAL HISTORY: He  has past surgical history that includes Facial cosmetic surgery.  No Known Allergies  No current facility-administered medications on file prior to encounter.   Current Outpatient Prescriptions on File Prior to Encounter  Medication Sig  . cloNIDine (CATAPRES) 0.2 MG tablet Take 1 tablet (0.2 mg total) by mouth 2 (two) times daily.  Marland Kitchen lisinopril-hydrochlorothiazide (PRINZIDE,ZESTORETIC) 20-25 MG tablet Take 1 tablet by mouth daily.  . hydrALAZINE (APRESOLINE) 25 MG tablet Take 1 tablet (25 mg total) by mouth 3 (three) times daily. (Patient not taking: Reported on 04/23/2015)    FAMILY HISTORY:  His has no family status information on file.   SOCIAL HISTORY: He  reports that he has been smoking Cigarettes.  He has been smoking about 1.00 pack per day. He does not  have any smokeless tobacco history on file. He reports that he drinks alcohol. He reports that he uses illicit drugs (Marijuana).  REVIEW OF SYSTEMS:   All negative; except for those that are bolded, which indicate positives.  Constitutional: weight loss, weight gain, night sweats, fevers, chills, fatigue, weakness.  HEENT: headaches, sore throat, sneezing, nasal congestion, post nasal drip, difficulty swallowing, tooth/dental problems, visual complaints, visual changes, ear aches. Neuro: difficulty with speech, weakness, numbness, ataxia. CV:  chest pain, orthopnea, PND, swelling in lower extremities, dizziness, palpitations, syncope.  Resp: cough, hemoptysis, dyspnea, wheezing. GI  heartburn, indigestion, abdominal pain, nausea, vomiting, diarrhea, constipation, change in bowel habits, loss of appetite, hematemesis, melena, hematochezia.  GU: dysuria, change in color of urine, urgency or frequency, flank pain, hematuria. MSK: joint pain or swelling, decreased range of motion. Psych: change in mood or affect, depression, anxiety, suicidal ideations, homicidal ideations. Skin: rash, itching, bruising.   SUBJECTIVE:  No additional hematemesis.  Currently comfortable.  VITAL SIGNS: BP 172/108 mmHg  Pulse 70  Temp(Src) 97.3 F (36.3 C) (Oral)  Resp 18  SpO2 97%  HEMODYNAMICS:    VENTILATOR SETTINGS:    INTAKE / OUTPUT:     PHYSICAL EXAMINATION: General: Middle aged AA male, in NAD. Neuro: A&O x 3, non-focal.  HEENT: [place]/AT. PERRL, sclerae anicteric. Cardiovascular: RRR, no M/R/G.  Lungs: Respirations even and unlabored.  CTA bilaterally, No W/R/R.  Abdomen: BS x 4, soft, NT/ND.  Musculoskeletal: No gross deformities, no edema.  Skin: Intact, warm, no rashes.  LABS:  BMET  Recent Labs Lab 04/23/15 1453  NA 140  K 3.4*  CL 100*  CO2 23  BUN 20  CREATININE 1.12  GLUCOSE 185*    Electrolytes  Recent Labs Lab 04/23/15 1453  CALCIUM 10.6*    CBC  Recent  Labs Lab 04/23/15 1453  WBC 23.7*  HGB 16.4  HCT 47.4  PLT 266    Coag's No results for input(s): APTT, INR in the last 168 hours.  Sepsis Markers  Recent Labs Lab 04/23/15 1615 04/23/15 1915  LATICACIDVEN 3.66* 3.61*    ABG No results for input(s): PHART, PCO2ART, PO2ART in the last 168 hours.  Liver Enzymes  Recent Labs Lab 04/23/15 1453  AST 24  ALT 14*  ALKPHOS 62  BILITOT 1.5*  ALBUMIN 5.7*    Cardiac Enzymes No results for input(s): TROPONINI, PROBNP in the last 168 hours.  Glucose No results for input(s): GLUCAP in the last 168 hours.  Imaging Ct Chest W Contrast  04/23/2015  ADDENDUM REPORT: 04/23/2015 18:30 ADDENDUM: These results were called by telephone by the PRA at the time of interpretation on 04/23/2015 at 6:29 pm. Electronically Signed   By: Elgie Collard M.D.   On: 04/23/2015 18:30  04/23/2015  CLINICAL DATA:  45 year old male with nausea vomiting concerning for esophageal perforation. EXAM: CT CHEST WITH CONTRAST TECHNIQUE: Multidetector CT imaging of the chest was performed during intravenous contrast administration. CONTRAST:  75mL OMNIPAQUE IOHEXOL 300 MG/ML  SOLN COMPARISON:  Chest radiograph dated 01/06/2015 and abdominal CT dated 04/23/2015 FINDINGS: The lungs are clear. Mild biapical subpleural/paraseptal emphysema. There is no pleural effusion or pneumothorax. The central airways are patent. The thoracic aorta and the central pulmonary arteries appear unremarkable. There is no cardiomegaly or pericardial effusion. No hilar or mediastinal adenopathy. There is moderate pneumomediastinum surrounding the esophagus and tracking upwards to the base of the neck. Findings are concerning for esophageal perforation. There is a small high attenuation material in the subcarinal region which is not well characterized but may represent small lymph nodes or small amount of extravasated oral contrast. The thyroid gland appears unremarkable. There is no axillary  adenopathy. The chest wall soft tissues appear unremarkable. The osseous structures are intact. The visualized upper abdomen the appears unremarkable. IMPRESSION: Pneumomediastinum concerning for esophageal perforation. Trace extravasated oral contrast may be present in the subcarinal region. Electronically Signed: By: Elgie Collard M.D. On: 04/23/2015 18:12   Ct Abdomen Pelvis W Contrast  04/23/2015  CLINICAL DATA:  Abdominal cramping. Nausea and vomiting with bloody emesis. Lower body aches starting last night. EXAM: CT ABDOMEN AND PELVIS WITH CONTRAST TECHNIQUE: Multidetector CT imaging of the abdomen and pelvis was performed using the standard protocol following bolus administration of intravenous contrast. CONTRAST:  OMNIPAQUE IOHEXOL 300 MG/ML  SOLN COMPARISON:  01/06/2015 FINDINGS: Lower chest: There is abnormal gas around the distal esophagus representing pneumomediastinum. Questionable wall thickening in the distal esophagus. Hepatobiliary: 5 mm hypodense lesion in segment 4a of the liver, stable, highly likely to be benign but technically nonspecific due to small size. Gallbladder unremarkable. Pancreas: Unremarkable Spleen: Unremarkable Adrenals/Urinary Tract: Unremarkable Stomach/Bowel: No dilated bowel.  Appendix normal. Vascular/Lymphatic: Aortoiliac atherosclerotic vascular disease. Reproductive: Unremarkable Other: No supplemental non-categorized findings. Musculoskeletal: Unremarkable IMPRESSION: 1. Pneumomediastinum around the distal esophagus. Distal esophageal wall thickening. 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 2. Mild aortoiliac atherosclerosis. 3. No dilated bowel observed. These results will be called to the ordering clinician or representative  by the Radiologist Assistant, and communication documented in the PACS or zVision Dashboard. Electronically Signed   By: Gaylyn Rong M.D.   On: 04/23/2015 17:01      STUDIES:  CT chest 02/09 > pneumomediastinum concerning for esophageal perforation. CT  A / P 02/09 > pneumomediastinum around the distal esophagus with distal esophageal wall thickening.  Appearance concerning for Boerhaave's syndrome.  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  LINES/TUBES: None.   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 GI prophylaxis. Nutrition. P:   CVTS called by EDP, opting to treat conservatively for now. SUP: Pantoprazole. NPO.  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 At risk mediastinitis. P:   Abx as above (vanc / zosyn / diflucan).  Follow cultures as above. Assess PCT.  PULMONARY A: At risk intubation if deteriorates. P:   Monitor clinically. CXR intermittently.  CARDIOVASCULAR A:  Hx HTN. P:  Continue outpatient clonidine, hydralazine. Repeat lactate, troponin.  RENAL A:   Hypokalemia. P:   40 MEq K now. NS @ 50. BMP in AM.  HEMATOLOGIC A:   VTE Prophylaxis. P:  SCD's only. CBC in AM.  ENDOCRINE A:   No known issues. P:   Monitor glucose on BMP. Assess TSH.  NEUROLOGIC A:   No acute issues. P:   Assess UDS.   Family updated: None.  Interdisciplinary Family Meeting v Palliative Care Meeting:  Due by: 04/29/15  CC time: 35 minutes.   Rutherford Guys, [place] [facility] Pager: [phone]  or [phone] 04/23/2015, 8:23 PM   STAFF NOTE: I, Rory Percy, MD FACP have personally reviewed patient's available data, including medical history, events of note, physical examination and test results as part of my evaluation. I have discussed with resident/NP and other care providers such as pharmacist, RN and RRT. In addition, I personally evaluated patient and elicited key findings of:  no distress, no crepitus in neck or chest, no neck pain or chest pain, he described "bad cookies" he ate, he described N / Vomiting then coffee ground, there is a concern for esoph boerhaave  Vs aybe from lung with coughing, repeat pcxr in am , consider gastrografin CT for esoph perf, will cover with polymicrobial and antifungal until we r/o perf esoph, strict NPO, pos balance, chem in am, I updated the pt in full , lactic The patient is critically ill with multiple organ systems failure and requires high complexity decision making for assessment and support, frequent evaluation and titration of therapies, application of advanced monitoring technologies and extensive interpretation of multiple databases.   Critical Care Time devoted to patient care services described in this note is 30 Minutes. This time reflects time of care of this signee: Rory Percy, MD FACP. This critical care time does not reflect procedure time, or teaching time or supervisory time of PA/NP/Med student/Med Resident etc but could involve care discussion time. Rest per NP/medical resident whose note is outlined above and that I agree with   Mcarthur Rossetti. Tyson Alias, MD, FACP Pgr: [phone] [facility] 04/23/2015 10:23 PM

## 2015-04-23 NOTE — ED Notes (Signed)
Pt vomited a moderate amount of bloody emesis and was then transported to CT.  MD notified.

## 2015-04-24 ENCOUNTER — Inpatient Hospital Stay (HOSPITAL_COMMUNITY): Payer: 59

## 2015-04-24 DIAGNOSIS — I10 Essential (primary) hypertension: Secondary | ICD-10-CM

## 2015-04-24 DIAGNOSIS — J982 Interstitial emphysema: Principal | ICD-10-CM

## 2015-04-24 LAB — MRSA PCR SCREENING: MRSA BY PCR: NEGATIVE

## 2015-04-24 MED ORDER — MORPHINE SULFATE (PF) 2 MG/ML IV SOLN
1.0000 mg | INTRAVENOUS | Status: DC | PRN
  Administered 2015-04-24 (×3): 2 mg via INTRAVENOUS
  Filled 2015-04-24: qty 2
  Filled 2015-04-24: qty 1

## 2015-04-24 MED ORDER — LISINOPRIL 20 MG PO TABS
20.0000 mg | ORAL_TABLET | Freq: Every day | ORAL | Status: DC
  Administered 2015-04-24: 20 mg via ORAL
  Filled 2015-04-24: qty 1

## 2015-04-24 MED ORDER — LACTATED RINGERS IV SOLN
INTRAVENOUS | Status: DC
  Administered 2015-04-24: 16:00:00 via INTRAVENOUS

## 2015-04-24 MED ORDER — HYDRALAZINE HCL 20 MG/ML IJ SOLN
10.0000 mg | INTRAMUSCULAR | Status: DC | PRN
  Administered 2015-04-24 (×2): 10 mg via INTRAVENOUS
  Filled 2015-04-24: qty 1

## 2015-04-24 MED ORDER — ONDANSETRON HCL 4 MG/2ML IJ SOLN
4.0000 mg | Freq: Once | INTRAMUSCULAR | Status: AC
  Administered 2015-04-24: 4 mg via INTRAVENOUS
  Filled 2015-04-24: qty 2

## 2015-04-24 MED ORDER — HYDRALAZINE HCL 20 MG/ML IJ SOLN
10.0000 mg | Freq: Once | INTRAMUSCULAR | Status: DC
  Filled 2015-04-24: qty 1

## 2015-04-24 MED ORDER — LACTATED RINGERS IV SOLN
INTRAVENOUS | Status: DC
  Administered 2015-04-24: 06:00:00 via INTRAVENOUS
  Filled 2015-04-24: qty 1000

## 2015-04-24 MED ORDER — HYDROCHLOROTHIAZIDE 25 MG PO TABS
25.0000 mg | ORAL_TABLET | Freq: Every day | ORAL | Status: DC
  Administered 2015-04-24: 25 mg via ORAL
  Filled 2015-04-24: qty 1

## 2015-04-24 MED ORDER — NICARDIPINE HCL IN NACL 20-0.86 MG/200ML-% IV SOLN
3.0000 mg/h | INTRAVENOUS | Status: DC
  Administered 2015-04-24: 5 mg/h via INTRAVENOUS
  Administered 2015-04-24: 12.5 mg/h via INTRAVENOUS
  Filled 2015-04-24 (×2): qty 200

## 2015-04-24 MED ORDER — LISINOPRIL-HYDROCHLOROTHIAZIDE 20-25 MG PO TABS: 1.0000 | ORAL_TABLET | Freq: Every day | ORAL | Status: DC

## 2015-04-24 MED ORDER — ONDANSETRON HCL 4 MG/2ML IJ SOLN
4.0000 mg | INTRAMUSCULAR | Status: DC | PRN
  Administered 2015-04-24: 4 mg via INTRAVENOUS
  Filled 2015-04-24: qty 2

## 2015-04-24 MED ORDER — NICARDIPINE HCL IN NACL 40-0.83 MG/200ML-% IV SOLN
3.0000 mg/h | INTRAVENOUS | Status: DC
  Administered 2015-04-24: 12.5 mg/h via INTRAVENOUS
  Filled 2015-04-24 (×2): qty 200

## 2015-04-24 MED ORDER — PROMETHAZINE HCL 25 MG/ML IJ SOLN
12.5000 mg | INTRAMUSCULAR | Status: DC | PRN
  Administered 2015-04-24: 12.5 mg via INTRAVENOUS
  Filled 2015-04-24: qty 1

## 2015-04-24 MED ORDER — HYDRALAZINE HCL 20 MG/ML IJ SOLN
10.0000 mg | Freq: Once | INTRAMUSCULAR | Status: AC
  Administered 2015-04-24: 10 mg via INTRAVENOUS
  Filled 2015-04-24: qty 1

## 2015-04-24 NOTE — Progress Notes (Signed)
eLink Physician-Brief Progress Note Patient Name: Anthony Atkinson DOB: 08/09/1970 MRN: [id number]   Date of Service  04/24/2015  HPI/Events of Note  Reviewed CT Chest. No contrast in mediastinum. Still has pneumomediastinum relatively unchanged.   eICU Interventions  Continue monitoring.     Intervention Category Intermediate Interventions: Diagnostic test evaluation  Lawanda Cousins 04/24/2015, 3:23 AM

## 2015-04-24 NOTE — Progress Notes (Signed)
eLink Physician-Brief Progress Note Patient Name: Anthony Atkinson DOB: 07-08-1970 MRN: [id number]   Date of Service  04/24/2015  HPI/Events of Note  Hypertension somewhat improved as is his nausea & pain. BP still >180/100.  eICU Interventions  Hydralazine  IV x1     Intervention Category Intermediate Interventions: Hypertension - evaluation and management  Lawanda Cousins 04/24/2015, 2:54 AM

## 2015-04-24 NOTE — ED Notes (Signed)
Called [facility] for transfer 

## 2015-04-24 NOTE — Progress Notes (Signed)
eLink Physician-Brief Progress Note Patient Name: Anthony Atkinson DOB: 1970-07-16 MRN: [id number]   Date of Service  04/24/2015  HPI/Events of Note  RN reports patient is having diarrhea along with N/V. May have eaten undercooked/raw cookie dough. Procalcitonin <0.1.  eICU Interventions  1. Checking stool PCR panel 2. Holding on antibiotics for now     Intervention Category Major Interventions: Sepsis - evaluation and management  Lawanda Cousins 04/24/2015, 5:43 AM

## 2015-04-24 NOTE — Progress Notes (Signed)
eLink Physician-Brief Progress Note Patient Name: Anthony Atkinson DOB: 06-21-1970 MRN: [id number]   Date of Service  04/24/2015  HPI/Events of Note  Contacted by [employer]. Patient hypertensive to >200 bilaterally. S/P repeated labetalol doses.  eICU Interventions  1. Start Hydralazine IV prn 2. Consider Cardene drip if unable to control BP     Intervention Category Major Interventions: Hypertension - evaluation and management  Lawanda Cousins 04/24/2015, 12:52 AM

## 2015-04-24 NOTE — Progress Notes (Signed)
PULMONARY / CRITICAL CARE MEDICINE   Name: Anthony Atkinson MRN: [id number] DOB: 1970-09-04    ADMISSION DATE:  04/23/2015 CONSULTATION DATE:  04/23/15  REFERRING MD:  EDP  CHIEF COMPLAINT:  Abd pain, N/V/D  HISTORY OF PRESENT ILLNESS:  Anthony Atkinson is a 45 y.o. male with PMH of HTN.  He presented to [facility] ED 02/09 with abd cramping, N/V/D, generalized body aches.  Pt had recently been admitted for similar symptoms but never had a confirmed diagnosis.  While in ED, he had an episode of moderate volume hematemesis.  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  CVTS was apparently contacted and opted to manage conservatively.  PCCM was consulted for medical management.   Pt states he bought a pack of cookie dough which he left in the care for several hours 4 days prior to ED presentation.  The following day, he cooked the cookies and after eating 3 - 4, he immediately became nauseous and had vomiting with diarrhea.  Symptoms resolved on their own on 04/20/14.   SUBJECTIVE:  Repeat Ct chest with gastrografin > no evidence to support perforated esophagus   VITAL SIGNS: BP 154/61 mmHg  Pulse 131  Temp(Src) 97.9 F (36.6 C) (Oral)  Resp 22  Ht  (1.854 m)  Wt 80.4 kg (177 lb 4 oz)  BMI 23.39 kg/m2  SpO2 97%  HEMODYNAMICS:    VENTILATOR SETTINGS:    INTAKE / OUTPUT: I/O last 3 completed shifts: In: 1950.8 [I.V.:1500.8; IV Piggyback:450] Out: 525 [Urine:225; Emesis/NG output:300]   PHYSICAL EXAMINATION: General: Middle aged AA male, in NAD. Neuro: A&O x 3, non-focal.  HEENT: [place]/AT. PERRL, sclerae anicteric. Cardiovascular: RRR, no M/R/G.  Lungs: Respirations even and unlabored.  CTA bilaterally, No W/R/R.  Abdomen: BS x 4, soft, NT/ND.  Musculoskeletal: No gross deformities, no edema.  Skin: Intact, warm, no rashes.  LABS:  BMET  Recent Labs Lab 04/23/15 1453  NA 140  K 3.4*  CL 100*  CO2 23   BUN 20  CREATININE 1.12  GLUCOSE 185*    Electrolytes  Recent Labs Lab 04/23/15 1453  CALCIUM 10.6*    CBC  Recent Labs Lab 04/23/15 1453  WBC 23.7*  HGB 16.4  HCT 47.4  PLT 266    Coag's No results for input(s): APTT, INR in the last 168 hours.  Sepsis Markers  Recent Labs Lab 04/23/15 1615 04/23/15 1915 04/23/15 2121  LATICACIDVEN 3.66* 3.61* 2.0  PROCALCITON  --   --  <0.10    ABG No results for input(s): PHART, PCO2ART, PO2ART in the last 168 hours.  Liver Enzymes  Recent Labs Lab 04/23/15 1453  AST 24  ALT 14*  ALKPHOS 62  BILITOT 1.5*  ALBUMIN 5.7*    Cardiac Enzymes  Recent Labs Lab 04/23/15 2121  TROPONINI <0.03    Glucose No results for input(s): GLUCAP in the last 168 hours.  Imaging Ct Chest Wo Contrast  04/23/2015  CLINICAL DATA:  45 year old male with additional evaluation of the previously seen perforated esophagus. EXAM: CT CHEST WITHOUT CONTRAST TECHNIQUE: Multidetector CT imaging of the chest was performed following the standard protocol without IV contrast. COMPARISON:  Earlier chest CT dated 04/23/2015 FINDINGS: Evaluation is limited due to respiratory motion artifact. The lungs are clear. No pleural effusion or pneumothorax. The central airways are patent. The thoracic aorta and pulmonary arteries are grossly unremarkable on this noncontrast study. There is no cardiomegaly or pericardial effusion. No hilar  or mediastinal adenopathy noted. There is pneumomediastinum tracking of parts to the base of the neck. Oral contrast noted within the esophagus. There is no definite evidence of extravasation of contrast outside of the confine of the esophagus. The thyroid gland appears unremarkable. The chest wall soft tissues and the osseous structures are intact. The visualized upper abdomen appears unremarkable. IMPRESSION: Pneumomediastinum as seen on the prior CT. No definite CT evidence of oral contrast extravasation outside of the  esophagus. Electronically Signed   By: Elgie Collard M.D.   On: 04/23/2015 23:34   Ct Chest W Contrast  04/23/2015  ADDENDUM REPORT: 04/23/2015 18:30 ADDENDUM: These results were called by telephone by the PRA at the time of interpretation on 04/23/2015 at 6:29 pm. Electronically Signed   By: Elgie Collard M.D.   On: 04/23/2015 18:30  04/23/2015  CLINICAL DATA:  45 year old male with nausea vomiting concerning for esophageal perforation. EXAM: CT CHEST WITH CONTRAST TECHNIQUE: Multidetector CT imaging of the chest was performed during intravenous contrast administration. CONTRAST:  75mL OMNIPAQUE IOHEXOL 300 MG/ML  SOLN COMPARISON:  Chest radiograph dated 01/06/2015 and abdominal CT dated 04/23/2015 FINDINGS: The lungs are clear. Mild biapical subpleural/paraseptal emphysema. There is no pleural effusion or pneumothorax. The central airways are patent. The thoracic aorta and the central pulmonary arteries appear unremarkable. There is no cardiomegaly or pericardial effusion. No hilar or mediastinal adenopathy. There is moderate pneumomediastinum surrounding the esophagus and tracking upwards to the base of the neck. Findings are concerning for esophageal perforation. There is a small high attenuation material in the subcarinal region which is not well characterized but may represent small lymph nodes or small amount of extravasated oral contrast. The thyroid gland appears unremarkable. There is no axillary adenopathy. The chest wall soft tissues appear unremarkable. The osseous structures are intact. The visualized upper abdomen the appears unremarkable. IMPRESSION: Pneumomediastinum concerning for esophageal perforation. Trace extravasated oral contrast may be present in the subcarinal region. Electronically Signed: By: Elgie Collard M.D. On: 04/23/2015 18:12   Ct Abdomen Pelvis W Contrast  04/23/2015  CLINICAL DATA:  Abdominal cramping. Nausea and vomiting with bloody emesis. Lower body aches starting  last night. EXAM: CT ABDOMEN AND PELVIS WITH CONTRAST TECHNIQUE: Multidetector CT imaging of the abdomen and pelvis was performed using the standard protocol following bolus administration of intravenous contrast. CONTRAST:  OMNIPAQUE IOHEXOL 300 MG/ML  SOLN COMPARISON:  01/06/2015 FINDINGS: Lower chest: There is abnormal gas around the distal esophagus representing pneumomediastinum. Questionable wall thickening in the distal esophagus. Hepatobiliary: 5 mm hypodense lesion in segment 4a of the liver, stable, highly likely to be benign but technically nonspecific due to small size. Gallbladder unremarkable. Pancreas: Unremarkable Spleen: Unremarkable Adrenals/Urinary Tract: Unremarkable Stomach/Bowel: No dilated bowel.  Appendix normal. Vascular/Lymphatic: Aortoiliac atherosclerotic vascular disease. Reproductive: Unremarkable Other: No supplemental non-categorized findings. Musculoskeletal: Unremarkable IMPRESSION: 1. Pneumomediastinum around the distal esophagus. Distal esophageal wall thickening. 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 2. Mild aortoiliac atherosclerosis. 3. No dilated bowel observed. These results will be called to the ordering clinician or representative by the Radiologist Assistant, and communication documented in the PACS or zVision Dashboard. Electronically Signed   By: Gaylyn Rong M.D.   On: 04/23/2015 17:01   Dg Chest Port 1 View  04/24/2015  CLINICAL DATA:  Intermittent vomiting starting last night. Hypertension. EXAM: PORTABLE CHEST 1 VIEW COMPARISON:  04/23/2015 FINDINGS: Small amount of pneumomediastinum best appreciated in the upper thorax. No pleural effusion. Heart  size within normal limits. The lungs appear clear. IMPRESSION: 1. Residual pneumomediastinum most perceptible in the upper thorax. No pleural effusion or pneumothorax. Electronically Signed   By: Gaylyn Rong M.D.   On: 04/24/2015 07:14      STUDIES:  CT chest 02/09 > pneumomediastinum concerning for esophageal perforation. CT  A / P 02/09 > pneumomediastinum around the distal esophagus with distal esophageal wall thickening.  Appearance concerning for Boerhaave's syndrome.\ CT chest + gastrografin 2/9 >> no extravasation of contrast to support perforation.   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  LINES/TUBES: None.   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 Nutrition. P:   CVTS following, opting to treat conservatively for now. SUP: Pantoprazole. NPO. Pain control  Nausea control  INFECTIOUS A:   At risk mediastinitis. Pct negative 2/9 pm P:   Abx as above (vanc / zosyn).  D/c fluconazole and vanco 2/10 given negative CT chest  Likely can simplify abx further on 2/11 Follow cultures as above.  PULMONARY A: ? Ruptured bleb At risk intubation if deteriorates. Stable at this time.  ? COPD, hx tobacco P:   Monitor clinically. CXR intermittently.  CARDIOVASCULAR A:  Chest pain due to mediastinal air, troponin negative Hx HTN. P:  Continue outpatient clonidine, hydralazine.  RENAL A:   Hypokalemia. P:   Follow BMP  HEMATOLOGIC A:   VTE Prophylaxis. P:  SCD's only. Follow CBC  ENDOCRINE A:   No known issues. P:   Monitor glucose on BMP.  NEUROLOGIC A:   No acute issues. P:   Assess UDS.   Family updated: None.  Interdisciplinary Family Meeting v Palliative Care Meeting:  Due by: 04/29/15   Levy Pupa, MD, PhD 04/24/2015, 9:33 AM [facility] [phone] or if no answer [phone]

## 2015-04-24 NOTE — Progress Notes (Signed)
eLink Physician-Brief Progress Note Patient Name: Anthony Atkinson DOB: October 14, 1970 MRN: [id number]   Date of Service  04/24/2015  HPI/Events of Note  Persistent hypertension despite PRN IV medications.  eICU Interventions  1. Nicardipene drip for goal SBP 155-175. 2. Transfer to ICU for tx     Intervention Category Major Interventions: Hypertension - evaluation and management  Lawanda Cousins 04/24/2015, 4:10 AM

## 2015-04-24 NOTE — Progress Notes (Signed)
Report called to Ros[place], Charity fundraiser. Pt transferring to [facility]

## 2015-04-24 NOTE — Progress Notes (Signed)
Pt pressed call light and I went to the room, when I entered the room the pt was very angry and frustrated at the staff. He stated that he has been calling out and no one has come to the room that he had to pee and no one would come help him the door was left open and people were screaming. I stated that I was not aware of the multiple call outs and was helping with an emergency on the unit. He did not care. I also spoke with him about fixing the problems turning off IV, fluids, stopping the noises and closing his door. He did not want to do that he wanted to speak to a physician and leave AMA. I called eLink and the physicians were very busy so I sent the charge nurse in to speak with him. She also stated the above on how we could fix things and how she could take over his care for the rest of the shift. He didn't want any of it. Charge nurse had the patient sign the AMA form and we removed his IVs and allowed him to leave.

## 2015-04-24 NOTE — Progress Notes (Signed)
eLink Physician-Brief Progress Note Patient Name: Anthony Atkinson DOB: 11-Jul-1970 MRN: [id number]   Date of Service  04/24/2015  HPI/Events of Note  Patient c/o persistent pain with nausea that is causing significant elevation of BP despite IV Hydralazine.  eICU Interventions  1. Morphine IV prn w/ instructions to hold for sedation 2. Zofran & Phenergan IV prn     Intervention Category Major Interventions: Other:  Lawanda Cousins 04/24/2015, 1:39 AM

## 2015-04-24 NOTE — Progress Notes (Signed)
eLink Physician-Brief Progress Note Patient Name: Anthony Atkinson DOB: 1970/06/06 MRN: [id number]   Date of Service  04/24/2015  HPI/Events of Note  RN reports patient now more tachycardic. BP better controlled on Cardene drip. Pain still 6-7/10. Only received  IV Morphine last dose.  eICU Interventions  1. Giving remaining  Morphine IV 2. Switching IVF to [facility] @ 125cc/hr 3. RN calling to ask lab to draw 0500 labs now (currently [phone])     Intervention Category Intermediate Interventions: Other:  Lawanda Cousins 04/24/2015, 6:17 AM

## 2015-04-28 LAB — CULTURE, BLOOD (ROUTINE X 2): CULTURE: NO GROWTH

## 2015-04-29 LAB — CULTURE, BLOOD (ROUTINE X 2): CULTURE: NO GROWTH

## 2015-05-14 ENCOUNTER — Other Ambulatory Visit (INDEPENDENT_AMBULATORY_CARE_PROVIDER_SITE_OTHER): Payer: 59

## 2015-05-14 ENCOUNTER — Encounter: Payer: Self-pay | Admitting: Internal Medicine

## 2015-05-14 ENCOUNTER — Ambulatory Visit (INDEPENDENT_AMBULATORY_CARE_PROVIDER_SITE_OTHER): Payer: 59 | Admitting: Internal Medicine

## 2015-05-14 VITALS — BP 150/98 | HR 80 | Temp 98.2°F | Resp 14 | Ht 73.0 in | Wt 185.0 lb

## 2015-05-14 DIAGNOSIS — K30 Functional dyspepsia: Secondary | ICD-10-CM | POA: Diagnosis not present

## 2015-05-14 DIAGNOSIS — I10 Essential (primary) hypertension: Secondary | ICD-10-CM

## 2015-05-14 DIAGNOSIS — E876 Hypokalemia: Secondary | ICD-10-CM

## 2015-05-14 DIAGNOSIS — R109 Unspecified abdominal pain: Secondary | ICD-10-CM

## 2015-05-14 DIAGNOSIS — K223 Perforation of esophagus: Secondary | ICD-10-CM | POA: Diagnosis not present

## 2015-05-14 LAB — COMPREHENSIVE METABOLIC PANEL
ALT: 8 U/L (ref 0–53)
AST: 11 U/L (ref 0–37)
Albumin: 4.5 g/dL (ref 3.5–5.2)
Alkaline Phosphatase: 46 U/L (ref 39–117)
BUN: 10 mg/dL (ref 6–23)
CALCIUM: 10 mg/dL (ref 8.4–10.5)
CHLORIDE: 102 meq/L (ref 96–112)
CO2: 31 meq/L (ref 19–32)
CREATININE: 0.86 mg/dL (ref 0.40–1.50)
GFR: 123.12 mL/min (ref >60.00)
Glucose, Bld: 87 mg/dL (ref 70–99)
POTASSIUM: 3.6 meq/L (ref 3.5–5.1)
Sodium: 138 mEq/L (ref 135–145)
Total Bilirubin: 0.4 mg/dL (ref 0.2–1.2)
Total Protein: 8.2 g/dL (ref 6.0–8.3)

## 2015-05-14 MED ORDER — LISINOPRIL-HYDROCHLOROTHIAZIDE 20-25 MG PO TABS: 1.0000 | ORAL_TABLET | Freq: Every day | ORAL | Status: DC

## 2015-05-14 MED ORDER — CLONIDINE HCL 0.2 MG PO TABS: 0.2000 mg | ORAL_TABLET | Freq: Two times a day (BID) | ORAL | Status: DC

## 2015-05-14 NOTE — Assessment & Plan Note (Signed)
Mild and checking labs today to see if resolved. In the context of diarrhea and vomiting.

## 2015-05-14 NOTE — Patient Instructions (Signed)
We will check the labs today and call you back with the results.   We will also get you in with the GI doctors at [facility] to look at the stomach.   Keep up the good work with less smoking and taking your medicines for the blood pressure.

## 2015-05-14 NOTE — Assessment & Plan Note (Signed)
Referral to GI. Doing well since leaving the hospital AMA and no bleeding or vomiting.

## 2015-05-14 NOTE — Progress Notes (Signed)
   Subjective:    Patient ID: Anthony Atkinson, male    DOB: Mar 20, 1970, 45 y.o.   MRN: [id number]  HPI The patient is a 45 YO man coming in for follow up of his blood pressure. He has been taking the lisinopril/hctz and hydralazine. He is only take the clonidine once daily. He thinks the BP has been coming down at home. No headaches or chest pains. He has also been in the hospital since last visit with esophagus problems. He has not had this twice in the past and wants to see a GI doctor. Denies any more vomiting blood, diarrhea, nausea since leaving the hospital AMA.   Review of Systems  Constitutional: Negative for fever, activity change, appetite change, fatigue and unexpected weight change.  HENT: Negative.   Eyes: Negative.   Respiratory: Negative for cough, chest tightness, shortness of breath and wheezing.   Cardiovascular: Negative for chest pain, palpitations and leg swelling.  Gastrointestinal: Negative for nausea, abdominal pain, diarrhea, constipation and abdominal distention.  Genitourinary: Negative.   Musculoskeletal: Negative.   Skin: Negative.   Neurological: Negative for dizziness, syncope, facial asymmetry, speech difficulty, weakness and headaches.  Psychiatric/Behavioral: Negative.       Objective:   Physical Exam  Constitutional: He is oriented to person, place, and time. He appears well-developed and well-nourished.  HENT:  Head: Normocephalic and atraumatic.  Eyes: EOM are normal.  Neck: Normal range of motion.  Cardiovascular: Normal rate and regular rhythm.   Pulmonary/Chest: Effort normal and breath sounds normal. No respiratory distress. He has no wheezes. He has no rales.  Abdominal: Soft. Bowel sounds are normal. He exhibits no distension. There is no tenderness. There is no rebound.  Musculoskeletal: He exhibits no edema.  Neurological: He is alert and oriented to person, place, and time. Coordination normal.  Skin: Skin is warm and dry.  Psychiatric:  He has a normal mood and affect.   Filed Vitals:   05/14/15 1044 05/14/15 1108  BP: 170/92 150/98  Pulse: 80   Temp: 98.2 F (36.8 C)   TempSrc: Oral   Resp: 14   Height:  (1.854 m)   Weight: 185 lb (83.915 kg)   SpO2: 98%       Assessment & Plan:

## 2015-05-14 NOTE — Assessment & Plan Note (Signed)
BP is much better than before. Checking CMP and adjust as needed. Advised to take clonidine BID, keep hydralazine TID, and lisinopril/hctz daily.

## 2015-05-14 NOTE — Progress Notes (Signed)
Pre visit review using our clinic review tool, if applicable. No additional management support is needed unless otherwise documented below in the visit note. 

## 2015-08-17 ENCOUNTER — Ambulatory Visit: Payer: 59 | Admitting: Internal Medicine

## 2015-11-13 HISTORY — PX: FACIAL COSMETIC SURGERY: SHX629

## 2015-12-14 ENCOUNTER — Inpatient Hospital Stay (HOSPITAL_COMMUNITY)
Admission: EM | Admit: 2015-12-14 | Discharge: 2015-12-19 | DRG: 871 | Disposition: A | Discharge status: Home / Self Care | Payer: 59 | Attending: Internal Medicine | Admitting: Internal Medicine

## 2015-12-14 ENCOUNTER — Inpatient Hospital Stay (HOSPITAL_COMMUNITY): Payer: 59

## 2015-12-14 ENCOUNTER — Emergency Department (HOSPITAL_COMMUNITY): Payer: 59

## 2015-12-14 ENCOUNTER — Encounter (HOSPITAL_COMMUNITY): Payer: Self-pay | Admitting: Emergency Medicine

## 2015-12-14 DIAGNOSIS — K223 Perforation of esophagus: Secondary | ICD-10-CM | POA: Diagnosis present

## 2015-12-14 DIAGNOSIS — I5032 Chronic diastolic (congestive) heart failure: Secondary | ICD-10-CM | POA: Diagnosis present

## 2015-12-14 DIAGNOSIS — A419 Sepsis, unspecified organism: Secondary | ICD-10-CM | POA: Diagnosis not present

## 2015-12-14 DIAGNOSIS — N289 Disorder of kidney and ureter, unspecified: Secondary | ICD-10-CM | POA: Diagnosis present

## 2015-12-14 DIAGNOSIS — Z8249 Family history of ischemic heart disease and other diseases of the circulatory system: Secondary | ICD-10-CM

## 2015-12-14 DIAGNOSIS — J449 Chronic obstructive pulmonary disease, unspecified: Secondary | ICD-10-CM | POA: Diagnosis present

## 2015-12-14 DIAGNOSIS — N179 Acute kidney failure, unspecified: Secondary | ICD-10-CM | POA: Diagnosis present

## 2015-12-14 DIAGNOSIS — I110 Hypertensive heart disease with heart failure: Secondary | ICD-10-CM | POA: Diagnosis present

## 2015-12-14 DIAGNOSIS — R112 Nausea with vomiting, unspecified: Secondary | ICD-10-CM | POA: Diagnosis present

## 2015-12-14 DIAGNOSIS — J431 Panlobular emphysema: Secondary | ICD-10-CM

## 2015-12-14 DIAGNOSIS — R066 Hiccough: Secondary | ICD-10-CM | POA: Diagnosis not present

## 2015-12-14 DIAGNOSIS — J9851 Mediastinitis: Secondary | ICD-10-CM | POA: Diagnosis present

## 2015-12-14 DIAGNOSIS — J982 Interstitial emphysema: Secondary | ICD-10-CM | POA: Diagnosis present

## 2015-12-14 DIAGNOSIS — Z79899 Other long term (current) drug therapy: Secondary | ICD-10-CM

## 2015-12-14 DIAGNOSIS — F1910 Other psychoactive substance abuse, uncomplicated: Secondary | ICD-10-CM

## 2015-12-14 DIAGNOSIS — F17210 Nicotine dependence, cigarettes, uncomplicated: Secondary | ICD-10-CM | POA: Diagnosis present

## 2015-12-14 DIAGNOSIS — E876 Hypokalemia: Secondary | ICD-10-CM | POA: Diagnosis not present

## 2015-12-14 DIAGNOSIS — K210 Gastro-esophageal reflux disease with esophagitis: Secondary | ICD-10-CM | POA: Diagnosis present

## 2015-12-14 DIAGNOSIS — I10 Essential (primary) hypertension: Secondary | ICD-10-CM | POA: Diagnosis not present

## 2015-12-14 LAB — CBC WITH DIFFERENTIAL/PLATELET
BASOS ABS: 0 10*3/uL (ref 0.0–0.1)
Basophils Relative: 0 %
Eosinophils Absolute: 0 10*3/uL (ref 0.0–0.7)
Eosinophils Relative: 0 %
HEMATOCRIT: 38.8 % — AB (ref 39.0–52.0)
HEMOGLOBIN: 13.7 g/dL (ref 13.0–17.0)
LYMPHS PCT: 2 %
Lymphs Abs: 0.5 10*3/uL — ABNORMAL LOW (ref 0.7–4.0)
MCH: 34 pg (ref 26.0–34.0)
MCHC: 35.3 g/dL (ref 30.0–36.0)
MCV: 96.3 fL (ref 78.0–100.0)
MONOS PCT: 1 %
Monocytes Absolute: 0.3 10*3/uL (ref 0.1–1.0)
NEUTROS ABS: 25.8 10*3/uL — AB (ref 1.7–7.7)
Neutrophils Relative %: 97 %
Platelets: 303 10*3/uL (ref 150–400)
RBC: 4.03 MIL/uL — AB (ref 4.22–5.81)
RDW: 12.8 % (ref 11.5–15.5)
WBC: 26.6 10*3/uL — AB (ref 4.0–10.5)

## 2015-12-14 LAB — URINE MICROSCOPIC-ADD ON

## 2015-12-14 LAB — RAPID URINE DRUG SCREEN, HOSP PERFORMED
Amphetamines: NOT DETECTED
BARBITURATES: NOT DETECTED
Benzodiazepines: NOT DETECTED
COCAINE: NOT DETECTED
OPIATES: POSITIVE — AB
Tetrahydrocannabinol: POSITIVE — AB

## 2015-12-14 LAB — COMPREHENSIVE METABOLIC PANEL
ALBUMIN: 5.8 g/dL — AB (ref 3.5–5.0)
ALBUMIN: 4.3 g/dL (ref 3.5–5.0)
ALK PHOS: 55 U/L (ref 38–126)
ALT: 17 U/L (ref 17–63)
ALT: 14 U/L — ABNORMAL LOW (ref 17–63)
ANION GAP: 16 — AB (ref 5–15)
ANION GAP: 13 (ref 5–15)
AST: 25 U/L (ref 15–41)
AST: 22 U/L (ref 15–41)
Alkaline Phosphatase: 64 U/L (ref 38–126)
BILIRUBIN TOTAL: 0.6 mg/dL (ref 0.3–1.2)
BUN: 15 mg/dL (ref 6–20)
BUN: 9 mg/dL (ref 6–20)
CALCIUM: 9.6 mg/dL (ref 8.9–10.3)
CHLORIDE: 98 mmol/L — AB (ref 101–111)
CO2: 23 mmol/L (ref 22–32)
CO2: 23 mmol/L (ref 22–32)
Calcium: 11 mg/dL — ABNORMAL HIGH (ref 8.9–10.3)
Chloride: 103 mmol/L (ref 101–111)
Creatinine, Ser: 1.16 mg/dL (ref 0.61–1.24)
Creatinine, Ser: 1 mg/dL (ref 0.61–1.24)
GFR calc Af Amer: >60 mL/min (ref >60)
GFR calc non Af Amer: >60 mL/min (ref >60)
GLUCOSE: 146 mg/dL — AB (ref 65–99)
GLUCOSE: 132 mg/dL — AB (ref 65–99)
Potassium: 3.6 mmol/L (ref 3.5–5.1)
Potassium: 3.8 mmol/L (ref 3.5–5.1)
SODIUM: 137 mmol/L (ref 135–145)
Sodium: 139 mmol/L (ref 135–145)
TOTAL PROTEIN: 7.9 g/dL (ref 6.5–8.1)
Total Bilirubin: 1.3 mg/dL — ABNORMAL HIGH (ref 0.3–1.2)
Total Protein: 10.3 g/dL — ABNORMAL HIGH (ref 6.5–8.1)

## 2015-12-14 LAB — URINALYSIS, ROUTINE W REFLEX MICROSCOPIC
Bilirubin Urine: NEGATIVE
Bilirubin Urine: NEGATIVE
GLUCOSE, UA: 250 mg/dL — AB
Glucose, UA: 250 mg/dL — AB
HGB URINE DIPSTICK: NEGATIVE
KETONES UR: NEGATIVE mg/dL
Ketones, ur: NEGATIVE mg/dL
LEUKOCYTES UA: NEGATIVE
Leukocytes, UA: NEGATIVE
NITRITE: NEGATIVE
Nitrite: NEGATIVE
PH: 7 (ref 5.0–8.0)
PROTEIN: NEGATIVE mg/dL
Protein, ur: NEGATIVE mg/dL
SPECIFIC GRAVITY, URINE: 1.034 — AB (ref 1.005–1.030)
Specific Gravity, Urine: 1.023 (ref 1.005–1.030)
pH: 8 (ref 5.0–8.0)

## 2015-12-14 LAB — I-STAT CG4 LACTIC ACID, ED
Lactic Acid, Venous: 3.88 mmol/L (ref 0.5–1.9)
Lactic Acid, Venous: 2.8 mmol/L (ref 0.5–1.9)

## 2015-12-14 LAB — GLUCOSE, CAPILLARY: GLUCOSE-CAPILLARY: 118 mg/dL — AB (ref 65–99)

## 2015-12-14 LAB — LACTIC ACID, PLASMA
LACTIC ACID, VENOUS: 5 mmol/L — AB (ref 0.5–1.9)
Lactic Acid, Venous: 3.5 mmol/L (ref 0.5–1.9)

## 2015-12-14 LAB — ETHANOL: Alcohol, Ethyl (B): <5 mg/dL (ref <5)

## 2015-12-14 LAB — AMYLASE: AMYLASE: 39 U/L (ref 28–100)

## 2015-12-14 LAB — CBC
HCT: 42.1 % (ref 39.0–52.0)
HEMOGLOBIN: 15.4 g/dL (ref 13.0–17.0)
MCH: 34.4 pg — AB (ref 26.0–34.0)
MCHC: 36.6 g/dL — ABNORMAL HIGH (ref 30.0–36.0)
MCV: 94 fL (ref 78.0–100.0)
Platelets: 376 10*3/uL (ref 150–400)
RBC: 4.48 MIL/uL (ref 4.22–5.81)
RDW: 12.7 % (ref 11.5–15.5)
WBC: 28.8 10*3/uL — ABNORMAL HIGH (ref 4.0–10.5)

## 2015-12-14 LAB — TROPONIN I: Troponin I: 0.03 ng/mL (ref <0.03)

## 2015-12-14 LAB — LIPASE, BLOOD
LIPASE: 19 U/L (ref 11–51)
Lipase: 22 U/L (ref 11–51)

## 2015-12-14 LAB — PHOSPHORUS: Phosphorus: 2.8 mg/dL (ref 2.5–4.6)

## 2015-12-14 LAB — PROTIME-INR
INR: 1.07
Prothrombin Time: 14 seconds (ref 11.4–15.2)

## 2015-12-14 LAB — PROCALCITONIN: Procalcitonin: 0.29 ng/mL

## 2015-12-14 LAB — APTT: APTT: 31 s (ref 24–36)

## 2015-12-14 LAB — MAGNESIUM: MAGNESIUM: 1.7 mg/dL (ref 1.7–2.4)

## 2015-12-14 MED ORDER — SODIUM CHLORIDE 0.9 % IV BOLUS (SEPSIS)
1000.0000 mL | Freq: Once | INTRAVENOUS | Status: AC
  Administered 2015-12-14: 1000 mL via INTRAVENOUS

## 2015-12-14 MED ORDER — PIPERACILLIN-TAZOBACTAM 3.375 G IVPB
3.3750 g | Freq: Three times a day (TID) | INTRAVENOUS | Status: DC
  Administered 2015-12-14 – 2015-12-19 (×12): 3.375 g via INTRAVENOUS
  Filled 2015-12-14 (×16): qty 50

## 2015-12-14 MED ORDER — METOCLOPRAMIDE HCL 5 MG/ML IJ SOLN
10.0000 mg | Freq: Once | INTRAMUSCULAR | Status: AC
  Administered 2015-12-14: 10 mg via INTRAVENOUS
  Filled 2015-12-14: qty 2

## 2015-12-14 MED ORDER — HYDRALAZINE HCL 20 MG/ML IJ SOLN
10.0000 mg | INTRAMUSCULAR | Status: DC | PRN
  Administered 2015-12-14 – 2015-12-15 (×4): 10 mg via INTRAVENOUS
  Filled 2015-12-14 (×4): qty 1

## 2015-12-14 MED ORDER — IOPAMIDOL (ISOVUE-300) INJECTION 61%
INTRAVENOUS | Status: AC
  Filled 2015-12-14: qty 150

## 2015-12-14 MED ORDER — FENTANYL CITRATE (PF) 100 MCG/2ML IJ SOLN
25.0000 ug | Freq: Once | INTRAMUSCULAR | Status: AC
  Administered 2015-12-14: 25 ug via INTRAVENOUS
  Filled 2015-12-14: qty 2

## 2015-12-14 MED ORDER — PIPERACILLIN-TAZOBACTAM 3.375 G IVPB
3.3750 g | Freq: Once | INTRAVENOUS | Status: AC
  Administered 2015-12-14: 3.375 g via INTRAVENOUS
  Filled 2015-12-14: qty 50

## 2015-12-14 MED ORDER — MORPHINE SULFATE (PF) 4 MG/ML IV SOLN
4.0000 mg | Freq: Once | INTRAVENOUS | Status: AC
  Administered 2015-12-14: 4 mg via INTRAVENOUS
  Filled 2015-12-14: qty 1

## 2015-12-14 MED ORDER — LACTATED RINGERS IV BOLUS (SEPSIS)
1000.0000 mL | Freq: Once | INTRAVENOUS | Status: AC
  Administered 2015-12-14: 1000 mL via INTRAVENOUS

## 2015-12-14 MED ORDER — SODIUM CHLORIDE 0.9 % IV SOLN
250.0000 mL | INTRAVENOUS | Status: DC | PRN
  Administered 2015-12-18: 250 mL via INTRAVENOUS

## 2015-12-14 MED ORDER — HYDROMORPHONE HCL 1 MG/ML IJ SOLN
1.0000 mg | Freq: Once | INTRAMUSCULAR | Status: AC
Start: 2015-12-14 — End: 2015-12-14
  Administered 2015-12-14: 1 mg via INTRAVENOUS
  Filled 2015-12-14: qty 1

## 2015-12-14 MED ORDER — KCL IN DEXTROSE-NACL 20-5-0.45 MEQ/L-%-% IV SOLN
INTRAVENOUS | Status: DC
Start: 2015-12-14 — End: 2015-12-19
  Administered 2015-12-14 – 2015-12-15 (×2): via INTRAVENOUS
  Administered 2015-12-16: 75 mL/h via INTRAVENOUS
  Administered 2015-12-16: 04:00:00 via INTRAVENOUS
  Filled 2015-12-14 (×7): qty 1000

## 2015-12-14 MED ORDER — MORPHINE SULFATE (PF) 4 MG/ML IV SOLN
4.0000 mg | INTRAVENOUS | Status: AC | PRN
  Administered 2015-12-14 (×2): 4 mg via INTRAVENOUS
  Filled 2015-12-14 (×2): qty 1

## 2015-12-14 MED ORDER — SODIUM CHLORIDE 0.9 % IV SOLN
8.0000 mg/h | INTRAVENOUS | Status: DC
  Administered 2015-12-14 – 2015-12-16 (×5): 8 mg/h via INTRAVENOUS
  Filled 2015-12-14 (×15): qty 80

## 2015-12-14 MED ORDER — INSULIN ASPART 100 UNIT/ML ~~LOC~~ SOLN
0.0000 [IU] | SUBCUTANEOUS | Status: DC
  Administered 2015-12-15: 2 [IU] via SUBCUTANEOUS
  Administered 2015-12-15: 3 [IU] via SUBCUTANEOUS
  Administered 2015-12-15 – 2015-12-16 (×4): 2 [IU] via SUBCUTANEOUS

## 2015-12-14 MED ORDER — FLUCONAZOLE IN SODIUM CHLORIDE 400-0.9 MG/200ML-% IV SOLN
400.0000 mg | INTRAVENOUS | Status: DC
  Administered 2015-12-15 – 2015-12-18 (×4): 400 mg via INTRAVENOUS
  Filled 2015-12-14 (×5): qty 200

## 2015-12-14 MED ORDER — ASPIRIN 300 MG RE SUPP: 300.0000 mg | RECTAL | Status: AC

## 2015-12-14 MED ORDER — LABETALOL HCL 5 MG/ML IV SOLN
10.0000 mg | INTRAVENOUS | Status: DC | PRN
  Administered 2015-12-14 – 2015-12-16 (×8): 10 mg via INTRAVENOUS
  Filled 2015-12-14 (×6): qty 4

## 2015-12-14 MED ORDER — VANCOMYCIN HCL IN DEXTROSE 1-5 GM/200ML-% IV SOLN
1000.0000 mg | Freq: Three times a day (TID) | INTRAVENOUS | Status: DC
  Administered 2015-12-14 – 2015-12-16 (×7): 1000 mg via INTRAVENOUS
  Filled 2015-12-14 (×9): qty 200

## 2015-12-14 MED ORDER — ONDANSETRON HCL 4 MG/2ML IJ SOLN
4.0000 mg | INTRAMUSCULAR | Status: DC | PRN
  Administered 2015-12-14 – 2015-12-16 (×9): 4 mg via INTRAVENOUS
  Filled 2015-12-14 (×9): qty 2

## 2015-12-14 MED ORDER — ONDANSETRON HCL 4 MG/2ML IJ SOLN
4.0000 mg | Freq: Once | INTRAMUSCULAR | Status: AC
  Administered 2015-12-14: 4 mg via INTRAVENOUS
  Filled 2015-12-14: qty 2

## 2015-12-14 MED ORDER — PROMETHAZINE HCL 25 MG/ML IJ SOLN
12.5000 mg | INTRAMUSCULAR | Status: DC | PRN
  Administered 2015-12-14 – 2015-12-16 (×7): 12.5 mg via INTRAVENOUS
  Filled 2015-12-14 (×7): qty 1

## 2015-12-14 MED ORDER — ONDANSETRON 4 MG PO TBDP
4.0000 mg | ORAL_TABLET | Freq: Once | ORAL | Status: AC | PRN
  Administered 2015-12-14: 4 mg via ORAL
  Filled 2015-12-14: qty 1

## 2015-12-14 MED ORDER — VANCOMYCIN HCL IN DEXTROSE 1-5 GM/200ML-% IV SOLN
1000.0000 mg | Freq: Once | INTRAVENOUS | Status: AC
  Administered 2015-12-14: 1000 mg via INTRAVENOUS
  Filled 2015-12-14: qty 200

## 2015-12-14 MED ORDER — ASPIRIN 81 MG PO CHEW: 324.0000 mg | CHEWABLE_TABLET | ORAL | Status: AC

## 2015-12-14 MED ORDER — ONDANSETRON HCL 4 MG/2ML IJ SOLN
INTRAMUSCULAR | Status: AC
  Filled 2015-12-14: qty 2

## 2015-12-14 MED ORDER — HYDROMORPHONE HCL 1 MG/ML IJ SOLN
1.0000 mg | Freq: Once | INTRAMUSCULAR | Status: AC
  Administered 2015-12-14: 1 mg via INTRAVENOUS
  Filled 2015-12-14: qty 1

## 2015-12-14 MED ORDER — IOPAMIDOL (ISOVUE-300) INJECTION 61%
100.0000 mL | Freq: Once | INTRAVENOUS | Status: AC | PRN
  Administered 2015-12-14: 100 mL via INTRAVENOUS

## 2015-12-14 NOTE — Progress Notes (Signed)
eLink Physician-Brief Progress Note Patient Name: Anthony Atkinson DOB: 04/05/1970 MRN: [id number]   Date of Service  12/14/2015  HPI/Events of Note  Lactic Acid = 3.5.  eICU Interventions  Will bolus with 0.9 NaCl 1 liter IV over 1 hour now.      Intervention Category Major Interventions: Acid-Base disturbance - evaluation and management  Kairee Isa Eugene 12/14/2015, 8:24 PM

## 2015-12-14 NOTE — ED Notes (Signed)
2nd IV attempt by RN

## 2015-12-14 NOTE — Progress Notes (Signed)
eLink Physician-Brief Progress Note Patient Name: Anthony LoopBrian Bartolo DOB: 09/10/1970 MRN: [id number]   Date of Service  12/14/2015  HPI/Events of Note  N/V - Not improved with Zofran.   eICU Interventions  Will order: 1. Phenergan 12.5 mg IV Q 4 hours PRN N/V.     Intervention Category Intermediate Interventions: Other:  Lenell AntuSommer,Steven Eugene 12/14/2015, 8:59 PM

## 2015-12-14 NOTE — ED Notes (Signed)
Critical care at bedside  

## 2015-12-14 NOTE — ED Triage Notes (Signed)
Patient c/o abd pain, with n/v that started this morning. Visitor states that he has been seen for similar symptoms.

## 2015-12-14 NOTE — ED Provider Notes (Signed)
[facility]-EMERGENCY DEPT Provider Note   CSN: [id number] Arrival date & time: 12/14/15  1044     History   Chief Complaint Chief Complaint  Patient presents with  . Abdominal Pain  . Chills  . Emesis    HPI Anthony Atkinson is a 45 y.o. male.  The history is provided by the patient and medical records.    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 Wife reports that patient got up early this morning around 1 AM and was complaining of abdominal pain. She states shortly after he began violently retching and complaining of epigastric abdominal pain.  Denies chest pain or SOB.  State they ate the same thing for dinner last night, wife feels fine.  Wife reports current symptoms are similar to his episode earlier this year in which the esophageal tear was found.  On arrival patient is diaphoretic, appears very uncomfortable. Wife states he has already changed his clothes 3 times this morning and has sweated through each pair. He is hypertensive. No known fever at home. Wife reports he did have follow-up after his esophageal tear earlier this year, everything seemed to be doing fine. He was encouraged to get a colonoscopy, however he is unwilling to do so.  Past Medical History:  Diagnosis Date  . Hypertension     Patient Active Problem List   Diagnosis Date Noted  . 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 Shortness of breath   . Hypokalemia   . 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 Tobacco abuse 02/13/2015  . Abnormal CT scan, colon 01/06/2015    Past Surgical History:  Procedure Laterality Date  . FACIAL COSMETIC SURGERY     cysts removed       Home Medications    Prior to Admission medications   Medication Sig Start Date End Date Taking? Authorizing Provider  cloNIDine (CATAPRES) 0.2 MG tablet Take 1 tablet (0.2 mg total) by mouth 2 (two) times daily. 05/14/15   Myrlene Broker, MD    hydrALAZINE (APRESOLINE) 25 MG tablet Take 1 tablet (25 mg total) by mouth 3 (three) times daily. 02/13/15   Myrlene Broker, MD  lisinopril-hydrochlorothiazide (PRINZIDE,ZESTORETIC) 20-25 MG tablet Take 1 tablet by mouth daily. 05/14/15   Myrlene Broker, MD    Family History No family history on file.  Social History Social History  Substance Use Topics  . Smoking status: Current Every Day Smoker    Packs/day: 1.00    Types: Cigarettes  . Smokeless tobacco: Never Used  . Alcohol use Yes     Comment: socially     Allergies   Review of patient's allergies indicates no known allergies.   Review of Systems Review of Systems  Gastrointestinal: Positive for abdominal pain, nausea and vomiting.  All other systems reviewed and are negative.    Physical Exam Updated Vital Signs BP (!) 136/104 (BP Location: Right Arm)   Pulse 98   Temp 97.8 F (36.6 C) (Oral)   Resp 20   Ht 6\' 1"  (1.854 m)   SpO2 100%   Physical Exam  Constitutional: He is oriented to person, place, and time. He appears well-developed and well-nourished. He appears ill.  Appears ill, diaphoretic, profusely sweating through his clothes, appears uncomfortable  HENT:  Head: Normocephalic and atraumatic.  Mouth/Throat: Oropharynx is clear and moist.  Eyes: Conjunctivae and EOM are normal. Pupils are equal, round, and reactive to light.  Neck: Normal range of  motion.  Cardiovascular: Normal rate, regular rhythm and normal heart sounds.   Pulmonary/Chest: Effort normal and breath sounds normal.  Abdominal: Soft. Bowel sounds are normal. There is tenderness in the epigastric area and left upper quadrant.  Musculoskeletal: Normal range of motion.  Neurological: He is alert and oriented to person, place, and time.  Skin: Skin is warm. He is diaphoretic.  Psychiatric: He has a normal mood and affect.  Nursing note and vitals reviewed.    ED Treatments / Results  Labs (all labs ordered are listed,  but only abnormal results are displayed) Labs Reviewed  COMPREHENSIVE METABOLIC PANEL - Abnormal; Notable for the following:       Result Value   Chloride 98 (*)    Glucose, Bld 146 (*)    Calcium 11.0 (*)    Total Protein 10.3 (*)    Albumin 5.8 (*)    Total Bilirubin 1.3 (*)    Anion gap 16 (*)    All other components within normal limits  CBC - Abnormal; Notable for the following:    WBC 28.8 (*)    MCH 34.4 (*)    MCHC 36.6 (*)    All other components within normal limits  URINALYSIS, ROUTINE W REFLEX MICROSCOPIC (NOT AT [facility]) - Abnormal; Notable for the following:    Specific Gravity, Urine 1.034 (*)    Glucose, UA 250 (*)    All other components within normal limits  I-STAT CG4 LACTIC ACID, ED - Abnormal; Notable for the following:    Lactic Acid, Venous 3.88 (*)    All other components within normal limits  I-STAT CG4 LACTIC ACID, ED - Abnormal; Notable for the following:    Lactic Acid, Venous 2.80 (*)    All other components within normal limits  CULTURE, BLOOD (ROUTINE X 2)  CULTURE, BLOOD (ROUTINE X 2)  URINE CULTURE  LIPASE, BLOOD  TROPONIN I    EKG  EKG Interpretation None       Radiology Ct Chest W Contrast  Result Date: 12/14/2015 CLINICAL DATA:  Abdominal pain with nausea and vomiting. History of Boerhaave syndrome. EXAM: CT CHEST, ABDOMEN, AND PELVIS WITH CONTRAST TECHNIQUE: Multidetector CT imaging of the chest, abdomen and pelvis was performed following the standard protocol during bolus administration of intravenous contrast. CONTRAST:  ISOVUE-300 IOPAMIDOL (ISOVUE-300) INJECTION 61% COMPARISON:  CT abdomen pelvis 04/23/2015. FINDINGS: CT CHEST FINDINGS Cardiovascular: The heart size is normal. There is no pericardial effusion. There is a normal 3-vessel aortic branching pattern. There is no aortic atherosclerosis. Mediastinum/Nodes: There is pneumomediastinum with air anterior to the esophagus tracking superiorly posterior to the pulmonary  artery. Air is also seen anterior to the aortic outflow tract. Thyroid is unremarkable. No mediastinal, hilar or axillary lymphadenopathy. Lungs/Pleura: Multiple posterior biapical cystic spaces. There are multiple nodular subpleural opacities along the left diaphragmatic dome. Lungs are otherwise normal. No pleural effusion. 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 This is unchanged from the prior study of 04/23/2015 No biliary dilatation. The gallbladder is normal. Pancreas: Pancreatic contours are normal. No abnormal calcifications or mass lesions. No peripancreatic fluid collection. No pancreatic ductal dilatation. Spleen: Normal Adrenals/Urinary Tract: Adrenal glands are normal. No hydronephrosis or solid renal mass. Stomach/Bowel: No dilated small bowel or other evidence of obstruction. No enteric or colonic inflammation. The appendix is normal. Vascular/Lymphatic: No abdominal aortic aneurysm. The inferior vena cava,  portal vein, splenic vein and superior mesenteric vein are patent. The aorta, bilateral renal arteries, celiac axis and superior mesenteric artery are patent. The visualized iliac vessels are normal. Mild aortic atherosclerotic calcification. No pelvic or abdominal lymphadenopathy. Reproductive: Prostate is enlarged, measuring 5 cm in transverse dimension. Other: None Musculoskeletal: No bony spinal canal stenosis. No lytic or blastic lesions. IMPRESSION: 1. Small volume pneumomediastinum, with air tracking anterior to the proximal ascending aorta, posterior to the pulmonary arteries and medially along the right mainstem bronchus. The findings likely indicate esophageal rupture. 2. Mild aortic atherosclerosis. 3. Unchanged subpleural nodular opacities of the left of my down. These results will be called to the ordering clinician or representative by the  Radiologist Assistant, and communication documented in the PACS or zVision Dashboard. Electronically Signed   By: Deatra RobinsonKevin  Herman M.D.   On: 12/14/2015 14:57   Ct Abdomen Pelvis W Contrast  Result Date: 12/14/2015 CLINICAL DATA:  Abdominal pain with nausea and vomiting. History of Boerhaave syndrome. EXAM: CT CHEST, ABDOMEN, AND PELVIS WITH CONTRAST TECHNIQUE: Multidetector CT imaging of the chest, abdomen and pelvis was performed following the standard protocol during bolus administration of intravenous contrast. CONTRAST:  100mL ISOVUE-300 IOPAMIDOL (ISOVUE-300) INJECTION 61% COMPARISON:  CT abdomen pelvis 04/23/2015. FINDINGS: CT CHEST FINDINGS Cardiovascular: The heart size is normal. There is no pericardial effusion. There is a normal 3-vessel aortic branching pattern. There is no aortic atherosclerosis. Mediastinum/Nodes: There is pneumomediastinum with air anterior to the esophagus tracking superiorly posterior to the pulmonary artery. Air is also seen anterior to the aortic outflow tract. Thyroid is unremarkable. No mediastinal, hilar or axillary lymphadenopathy. Lungs/Pleura: Multiple posterior biapical cystic spaces. There are multiple nodular subpleural opacities along the left diaphragmatic dome. Lungs are otherwise normal. No pleural effusion. 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 This is unchanged from the prior study of 04/23/2015 No biliary dilatation. The gallbladder is normal. Pancreas: Pancreatic contours are normal. No abnormal calcifications or mass lesions. No peripancreatic fluid collection. No pancreatic ductal dilatation. Spleen: Normal Adrenals/Urinary Tract: Adrenal glands are normal. No hydronephrosis or solid renal mass. Stomach/Bowel: No dilated small bowel or other evidence of obstruction. No enteric or colonic inflammation. The appendix is  normal. Vascular/Lymphatic: No abdominal aortic aneurysm. The inferior vena cava, portal vein, splenic vein and superior mesenteric vein are patent. The aorta, bilateral renal arteries, celiac axis and superior mesenteric artery are patent. The visualized iliac vessels are normal. Mild aortic atherosclerotic calcification. No pelvic or abdominal lymphadenopathy. Reproductive: Prostate is enlarged, measuring 5 cm in transverse dimension. Other: None Musculoskeletal: No bony spinal canal stenosis. No lytic or blastic lesions. IMPRESSION: 1. Small volume pneumomediastinum, with air tracking anterior to the proximal ascending aorta, posterior to the pulmonary arteries and medially along the right mainstem bronchus. The findings likely indicate esophageal rupture. 2. Mild aortic atherosclerosis. 3. Unchanged subpleural nodular opacities of the left of my down. These results will be called to the ordering clinician or representative by the Radiologist Assistant, and communication documented in the PACS or zVision Dashboard. Electronically Signed   By: Deatra RobinsonKevin  Herman M.D.   On: 12/14/2015 14:57   Dg Abdomen Acute W/chest  Result Date: 12/14/2015 CLINICAL DATA:  Abdominal pain. EXAM: DG ABDOMEN ACUTE W/ 1V CHEST COMPARISON:  04/24/2015.  CT 04/23/2015. FINDINGS: Soft tissue structures are unremarkable. No acute cardiopulmonary disease. No acute intra-abdominal disease. No bowel distention. Prominent  amount of stool noted throughout the colon. No free air. Pelvic calcifications consistent phleboliths. Leads noted over the abdomen in chest. IMPRESSION: Number No acute cardiopulmonary disease. 2. No acute intra-abdominal abnormality. Prominent amount of stool noted throughout colon. Constipation cannot be excluded. Electronically Signed   By: Maisie Fus  Register   On: 12/14/2015 12:45    Procedures Procedures (including critical care time)  CRITICAL CARE Performed by: Garlon Hatchet   Total critical care time: 60  minutes  Critical care time was exclusive of separately billable procedures and treating other patients.  Critical care was necessary to treat or prevent imminent or life-threatening deterioration.  Critical care was time spent personally by me on the following activities: development of treatment plan with patient and/or surrogate as well as nursing, discussions with consultants, evaluation of patient's response to treatment, examination of patient, obtaining history from patient or surrogate, ordering and performing treatments and interventions, ordering and review of laboratory studies, ordering and review of radiographic studies, pulse oximetry and re-evaluation of patient's condition.   Medications Ordered in ED Medications  piperacillin-tazobactam (ZOSYN) IVPB 3.375 g (3.375 g Intravenous New Bag/Given 12/14/15 1332)  ondansetron (ZOFRAN-ODT) disintegrating tablet 4 mg (4 mg Oral Given 12/14/15 1056)  sodium chloride 0.9 % bolus 1,000 mL (0 mLs Intravenous Stopped 12/14/15 1333)  ondansetron (ZOFRAN) injection 4 mg (4 mg Intravenous Given 12/14/15 1151)  morphine 4 MG/ML injection 4 mg (4 mg Intravenous Given 12/14/15 1152)  sodium chloride 0.9 % bolus 1,000 mL (0 mLs Intravenous Stopped 12/14/15 1441)  HYDROmorphone (DILAUDID) injection 1 mg (1 mg Intravenous Given 12/14/15 1324)  metoCLOPramide (REGLAN) injection 10 mg (10 mg Intravenous Given 12/14/15 1324)  vancomycin (VANCOCIN) IVPB 1000 mg/200 mL premix (1,000 mg Intravenous New Bag/Given 12/14/15 1440)  iopamidol (ISOVUE-300) 61 % injection 100 mL (100 mLs Intravenous Contrast Given 12/14/15 1355)  HYDROmorphone (DILAUDID) injection 1 mg (1 mg Intravenous Given 12/14/15 1514)     Initial Impression / Assessment and Plan / ED Course  I have reviewed the triage vital signs and the nursing notes.  Pertinent labs & imaging results that were available during my care of the patient were reviewed by me and considered in my medical decision  making (see chart for details).  Clinical Course   45 year old male here with abdominal pain, nausea, and vomiting. Patient is very ill-appearing on exam, diaphoretic. He has tenderness in the epigastrium and left upper quadrant on exam. Lactate is elevated with white count of 28. IV fluids and antibiotics started. 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 Patient suffered from the same in February of this year which was treated medically. Will discuss with CT surgery. Will need admission.  3:41 PM Discussed with CT surgery PA Ryan-- Dr. Lyn Henri in the OR currently.  OK to transfer to [facility], will evaluate on patient's arrival.  Critical care will need to admit.  Dr. Erma Heritage in [facility] aware of transfer.  3:55 PM Spoke with critical care, Dr. Arsenio Loader-- will have someone evaluate on arrival to [facility] for admission.  Repeat lactic improved, now 2.80.  Final Clinical Impressions(s) / ED Diagnoses   Final diagnoses:  Esophageal perforation    New Prescriptions New Prescriptions   No medications on file     Garlon Hatchet, Cordelia Poche 12/14/15 1611    Azalia Bilis, MD 12/14/15 1704

## 2015-12-14 NOTE — Progress Notes (Signed)
Patient ID: Anthony Atkinson, male   DOB: 10/13/1970, 45 y.o.   MRN: [id number]      [address]       [place] [address]             [phone]        Vineeth Fell [facility] Medical Record #[id number] Date of Birth: [birth year]  Referring: No ref. provider found Primary Care: Myrlene Broker, MD  Chief Complaint:    Chief Complaint  Patient presents with  . Abdominal Pain  . Chills  . Emesis    History of Present Illness:     Patient at 7 am this morning had nausea vomiting, no hematemesis . In  Feb 2017  presented to [facility] ED  with abd cramping, N/V/D, generalized body aches.  Pt had recently been admitted for similar symptoms but never had a confirmed diagnosis.  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 Follow up scans showed no perforation .  Today he had similar symptoms and went [facility] chest abdomen without oral contrast was done showed small pneumomediastinum, no pleural effusion or PTX. Patient transferred to [facility] for further evaluation. I saw the patient about 5 pm and recommended Gastrogaffin swallow, to evaluate for esophageal perforation.    Current Activity/ Functional Status: Patient is independent with mobility/ambulation, transfers, ADL's, IADL's.   Zubrod Score: At the time of surgery this patient's most appropriate activity status/level should be described as: []     0    Normal activity, no symptoms [x]     1    Restricted in physical strenuous activity but ambulatory, able to do out light work []     2    Ambulatory and capable of self care, unable to do work activities, up and about                 more than 50%  Of the time                            []     3    Only limited self care, in bed greater than 50% of waking hours []     4    Completely disabled, no self care, confined to bed or chair []     5    Moribund  Past Medical History:  Diagnosis Date  .  Hypertension     Past Surgical History:  Procedure Laterality Date  . FACIAL COSMETIC SURGERY     cysts removed    History  Smoking Status  . Current Every Day Smoker  . Packs/day: 1.00  . Types: Cigarettes  Smokeless Tobacco  . Never Used   History  Alcohol Use  . Yes    Comment: socially    Social History   Social History  . Marital status: Married    Spouse name: N/A  . Number of children: N/A  . Years of education: N/A   Occupational History  . Not on file.   Social History Main Topics  . Smoking status: Current Every Day Smoker    Packs/day: 1.00    Types: Cigarettes  . Smokeless tobacco: Never Used  . Alcohol use Yes     Comment: socially  . Drug use:     Types: Marijuana  . Sexual activity: Not on file   Other Topics Concern  . Not on file   Social History  Narrative  . No narrative on file    No Known Allergies  Current Facility-Administered Medications  Medication Dose Route Frequency Provider Last Rate Last Dose  . 0.9 %  sodium chloride infusion  250 mL Intravenous PRN Jose Alexis Frock, MD      . aspirin chewable tablet 324 mg  324 mg Oral NOW Jose Angelo Dorcas Mcmurray, MD       Or  . aspirin suppository 300 mg  300 mg Rectal NOW Jose Angelo A de Dios, MD      . dextrose 5 % and 0.45 % NaCl with KCl 20 mEq/L infusion   Intravenous Continuous Jose Alexis Frock, MD      . fluconazole (DIFLUCAN) IVPB 400 mg  400 mg Intravenous Q24H Justin R Crowder, Student-PharmD      . hydrALAZINE (APRESOLINE) injection 10 mg  10 mg Intravenous Q4H PRN Jose Angelo A de Dios, MD      . insulin aspart (novoLOG) injection 0-15 Units  0-15 Units Subcutaneous Q4H Jose Angelo A de Dios, MD      . iopamidol (ISOVUE-300) 61 % injection           . morphine 4 MG/ML injection 4 mg  4 mg Intravenous Q30 min PRN Dione Booze, MD   4 mg at 12/14/15 1645  . ondansetron (ZOFRAN) 4 MG/2ML injection           . ondansetron (ZOFRAN) injection 4 mg  4 mg Intravenous Q3H PRN  Horald Pollen, MD   4 mg at 12/14/15 1737  . pantoprazole (PROTONIX) 80 mg in sodium chloride 0.9 % 250 mL (0.32 mg/mL) infusion  8 mg/hr Intravenous Continuous Horald Pollen, MD 25 mL/hr at 12/14/15 1757 8 mg/hr at 12/14/15 1757  . piperacillin-tazobactam (ZOSYN) IVPB 3.375 g  3.375 g Intravenous [address] Student-PharmD      . vancomycin (VANCOCIN) IVPB 1000 mg/200 mL premix  1,000 mg Intravenous Q8H Lianne Cure, Student-PharmD       Current Outpatient Prescriptions  Medication Sig Dispense Refill  . cloNIDine (CATAPRES) 0.2 MG tablet Take 1 tablet (0.2 mg total) by mouth 2 (two) times daily. 180 tablet 3  . doxycycline (VIBRAMYCIN) 100 MG capsule Take 100 mg by mouth 2 (two) times daily as needed for other. For boils on face/ acne    . HYDROcodone-acetaminophen (NORCO/VICODIN) 5-325 MG tablet Take 1 tablet by mouth every 6 (six) hours as needed for pain.    Marland Kitchen lisinopril-hydrochlorothiazide (PRINZIDE,ZESTORETIC) 20-25 MG tablet Take 1 tablet by mouth daily. 90 tablet 3  . hydrALAZINE (APRESOLINE) 25 MG tablet Take 1 tablet (25 mg total) by mouth 3 (three) times daily. (Patient not taking: Reported on 12/14/2015) 90 tablet 3     (Not in a hospital admission)  No family history on file.   Review of Systems:      Cardiac Review of Systems: Y or N  Chest Pain [  y  ]  Resting SOB [n   ] Exertional SOB  Cove.Etienne  ]  Kenese.Mounts [  ]   Pedal Edema [   ]    Palpitations [  ] Syncope  [  ]   Presyncope [   ]  General Review of Systems: [Y] = yes [  ]=no Constitional: recent weight change [n  ]; anorexia [  ]; fatigue [  ]; nausea [  ]; night sweats [ y ]; fever Cove.Etienne  ]; or chills [  ]  Dental: poor dentition[  ]; Last Dentist visit:   Eye : blurred vision [  ]; diplopia [   ]; vision changes [  ];  Amaurosis fugax[  ]; Resp: cough [  ];  wheezing[  ];  hemoptysis[  ]; shortness of breath[ y ]; paroxysmal nocturnal dyspnea[   ]; dyspnea on exertion[  ]; or orthopnea[  ];  GI:  gallstones[  ], vomiting[y  ];  dysphagia[  ]; melena[  ];  hematochezia [  ]; heartburn[  ];   Hx of  Colonoscopy[  ]; GU: kidney stones [  ]; hematuria[  ];   dysuria [  ];  nocturia[  ];  history of     obstruction [  ]; urinary frequency [  ]             Skin: rash, swelling[  ];, hair loss[  ];  peripheral edema[  ];  or itching[  ]; Musculosketetal: myalgias[  ];  joint swelling[  ];  joint erythema[  ];  joint pain[  ];  back pain[  ];  Heme/Lymph: bruising[  ];  bleeding[  ];  anemia[  ];  Neuro: TIA[  ];  headaches[  ];  stroke[  ];  vertigo[  ];  seizures[  ];   paresthesias[  ];  difficulty walking[  ];  Psych:depression[  ]; anxiety[  ];  Endocrine: diabetes[  ];  thyroid dysfunction[  ];  Immunizations: Flu [  ]; Pneumococcal[  ];  Other:  Physical Exam: BP (!) 183/106   Pulse 81   Temp 98.6 F (37 C) (Oral)   Resp 18   Ht 6\' 1"  (1.854 m)   Wt 187 lb (84.8 kg)   SpO2 100%   BMI 24.67 kg/m   Shaking chills and diaphoretic and hypertensive when seen about 5pm General appearance: alert, cooperative, appears older than stated age and moderate distress Head: Normocephalic, without obvious abnormality, atraumatic Neck: no adenopathy, no carotid bruit, no JVD, supple, symmetrical, trachea midline and thyroid not enlarged, symmetric, no tenderness/mass/nodules Lymph nodes: Cervical, supraclavicular, and axillary nodes normal. Resp: diminished breath sounds bibasilar Back: symmetric, no curvature. ROM normal. No CVA tenderness. Cardio: regular rate and rhythm, S1, S2 normal, no murmur, click, rub or gallop GI: soft, non-tender; bowel sounds normal; no masses,  no organomegaly Extremities: extremities normal, atraumatic, no cyanosis or edema and Homans sign is negative, no sign of DVT Neurologic: Grossly normal  Diagnostic Studies & Laboratory data:     Recent Radiology Findings:    Ct Chest W Contrast & Ct Abdomen  Pelvis W Contrast  Result Date: 12/14/2015 CLINICAL DATA:  Abdominal pain with nausea and vomiting. History of Boerhaave syndrome. EXAM: CT CHEST, ABDOMEN, AND PELVIS WITH CONTRAST TECHNIQUE: Multidetector CT imaging of the chest, abdomen and pelvis was performed following the standard protocol during bolus administration of intravenous contrast. CONTRAST:  100mL ISOVUE-300 IOPAMIDOL (ISOVUE-300) INJECTION 61% COMPARISON:  CT abdomen pelvis 04/23/2015. FINDINGS: CT CHEST FINDINGS Cardiovascular: The heart size is normal. There is no pericardial effusion. There is a normal 3-vessel aortic branching pattern. There is no aortic atherosclerosis. Mediastinum/Nodes: There is pneumomediastinum with air anterior to the esophagus tracking superiorly posterior to the pulmonary artery. Air is also seen anterior to the aortic outflow tract. Thyroid is unremarkable. No mediastinal, hilar or axillary lymphadenopathy. Lungs/Pleura: Multiple posterior biapical cystic spaces. There are multiple nodular subpleural opacities along the left diaphragmatic dome. Lungs are otherwise normal. No pleural effusion. CT ABDOMEN PELVIS FINDINGS  Hepatobiliary: Sub centimeter hypodensity in the left hepatic lobe is too small to characterize accurately but most consistent with a benign hepatic cyst. This is unchanged from the prior study of 04/23/2015 No biliary dilatation. The gallbladder is normal. Pancreas: Pancreatic contours are normal. No abnormal calcifications or mass lesions. No peripancreatic fluid collection. No pancreatic ductal dilatation. Spleen: Normal Adrenals/Urinary Tract: Adrenal glands are normal. No hydronephrosis or solid renal mass. Stomach/Bowel: No dilated small bowel or other evidence of obstruction. No enteric or colonic inflammation. The appendix is normal. Vascular/Lymphatic: No abdominal aortic aneurysm. The inferior vena cava, portal vein, splenic vein and superior mesenteric vein are patent. The aorta, bilateral  renal arteries, celiac axis and superior mesenteric artery are patent. The visualized iliac vessels are normal. Mild aortic atherosclerotic calcification. No pelvic or abdominal lymphadenopathy. Reproductive: Prostate is enlarged, measuring 5 cm in transverse dimension. Other: None Musculoskeletal: No bony spinal canal stenosis. No lytic or blastic lesions. IMPRESSION: 1. Small volume pneumomediastinum, with air tracking anterior to the proximal ascending aorta, posterior to the pulmonary arteries and medially along the right mainstem bronchus. The findings likely indicate esophageal rupture. 2. Mild aortic atherosclerosis. 3. Unchanged subpleural nodular opacities of the left of my down. These results will be called to the ordering clinician or representative by the Radiologist Assistant, and communication documented in the PACS or zVision Dashboard. Electronically Signed   By: Deatra Robinson M.D.   On: 12/14/2015 14:57   Dg Abdomen Acute W/chest  Result Date: 12/14/2015 CLINICAL DATA:  Abdominal pain. EXAM: DG ABDOMEN ACUTE W/ 1V CHEST COMPARISON:  04/24/2015.  CT 04/23/2015. FINDINGS: Soft tissue structures are unremarkable. No acute cardiopulmonary disease. No acute intra-abdominal disease. No bowel distention. Prominent amount of stool noted throughout the colon. No free air. Pelvic calcifications consistent phleboliths. Leads noted over the abdomen in chest. IMPRESSION: Number No acute cardiopulmonary disease. 2. No acute intra-abdominal abnormality. Prominent amount of stool noted throughout colon. Constipation cannot be excluded. Electronically Signed   By: Maisie Fus  Register   On: 12/14/2015 12:45     I have independently reviewed the above radiologic studies.  Recent Lab Findings: Lab Results  Component Value Date   WBC 28.8 (H) 12/14/2015   HGB 15.4 12/14/2015   HCT 42.1 12/14/2015   PLT 376 12/14/2015   GLUCOSE 146 (H) 12/14/2015   ALT 17 12/14/2015   AST 25 12/14/2015   NA 137 12/14/2015    K 3.6 12/14/2015   CL 98 (L) 12/14/2015   CREATININE 1.16 12/14/2015   BUN 15 12/14/2015   CO2 23 12/14/2015   TSH 0.939 01/07/2015   Lactic acid  2.8  Assessment / Plan:   R/O esophageal perforation with Gastrogaffin swallow - after results then consider if surgery is indicated  Sepsis - started on IV antibiotics and CCM consulted      I  spent 40 minutes counseling the patient face to face and 50% or more the  time was spent in counseling and coordination of care. The total time spent in the appointment was 60 minutes.    Delight Ovens MD      [address] [place] [place] [address] Office [phone]   Beeper [phone]  12/14/2015 6:59 PM

## 2015-12-14 NOTE — ED Provider Notes (Signed)
Attempting to find CT tech at this time for stat CT chest,abd,pelvis   Azalia BilisKevin Nakeyia Menden, MD 12/14/15 1339

## 2015-12-14 NOTE — ED Notes (Signed)
NS Bolus changed to 20g in RAC

## 2015-12-14 NOTE — ED Notes (Signed)
Patient transported to X-ray 

## 2015-12-14 NOTE — ED Notes (Signed)
Patient transported to CT 

## 2015-12-14 NOTE — ED Provider Notes (Signed)
Patient with esophageal perforation.  He is diaphoretic Pale.  From a heart rate and blood pressure standpoint he is still okay at this time.  He has 2 IVs infusing.  He's been given broad-spectrum antibiotics.  He's been informed of his acute illness and [facility] need [facility].  We will discuss the case with CT surgery.  In the meantime I've worked on urgent[facility].  I discussed the case with Dr. Leta Junglingameron Isaac, [facility]   Azalia BilisKevin Codie Krogh, MD 12/14/15 [phone]

## 2015-12-14 NOTE — ED Notes (Signed)
Pt transported to x-ray with this RN.

## 2015-12-14 NOTE — ED Notes (Signed)
Pt. Is unable to urinate at this time. He is aware we need a sample.

## 2015-12-14 NOTE — ED Notes (Signed)
Bed: WA20 Expected date:  Expected time:  Means of arrival:  Comments: 

## 2015-12-14 NOTE — ED Notes (Signed)
Pt aware of need for urine sample.  He is unable to give one at this time.

## 2015-12-14 NOTE — Progress Notes (Signed)
Pharmacy Antibiotic Note  Anthony LoopBrian Atkinson is a 45 y.o. male admitted on 12/14/2015 to r/o esophogeal perforation.  Pharmacy has been consulted for vancomycin, zosyn and fluconazole dosing.  Plan: Fluconazole 400 mg q24h Zosyn 3.375g q8h extended infusion  Vancomycin 1g q8h  Height: 6\' 1"  (185.4 cm) Weight: 187 lb (84.8 kg) IBW/kg (Calculated) : 79.9  Temp (24hrs), Avg:97.9 F (36.6 C), Min:97.3 F (36.3 C), Max:98.6 F (37 C)   Recent Labs Lab 12/14/15 1053 12/14/15 1217 12/14/15 1548  WBC 28.8*  --   --   CREATININE 1.16  --   --   LATICACIDVEN  --  3.88* 2.80*    Estimated Creatinine Clearance: 89.9 mL/min (by C-G formula based on SCr of 1.16 mg/dL).    No Known Allergies  Antimicrobials this admission: Vancomycin 10/2 >> Zosyn 10/2 >> Fluconazole 10/2 >>  Dose adjustments this admission: N/A  Microbiology results: 10/2 BCx ([facility] @1314 ): Sent 10/2 BCx ([facility] @1208 ): Sent 10/2 UCx ([facility] @1049 ): Sent   Thank you for allowing pharmacy to be a part of this patient's care.  Lianne CureJustin R Khyler Urda, PharmD Candidate 12/14/2015 6:10 PM

## 2015-12-14 NOTE — ED Provider Notes (Signed)
[facility]-EMERGENCY DEPT Provider Note   CSN: [id number] Arrival date & time: 12/14/15  1044     History   Chief Complaint Chief Complaint  Patient presents with  . Abdominal Pain  . Chills  . Emesis    HPI Anthony Atkinson is a 45 y.o. male.  The history is provided by the patient, the spouse and medical records.  Chest Pain   This is a new problem. The current episode started 6 to 12 hours ago (started at 0645 today with vomiting). The problem occurs constantly. The problem has been gradually worsening. Associated with: vomiting. The pain is present in the substernal region. The pain is severe. The quality of the pain is described as pressure-like and sharp. The pain does not radiate. Duration of episode(s) is 8 hours. Associated symptoms include abdominal pain, diaphoresis, a fever, malaise/fatigue, shortness of breath and vomiting. Pertinent negatives include no back pain, no cough, no headaches and no hemoptysis. Risk factors: h/o Boerhaave previously 4 times.    Past Medical History:  Diagnosis Date  . Hypertension     Patient Active Problem List   Diagnosis Date Noted  . Pneumomediastinum (HCC) 12/14/2015  . Esophageal perforation   . Perforation of esophagus   . 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 Shortness of breath   . Hypokalemia   . 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 Tobacco abuse 02/13/2015  . Abnormal CT scan, colon 01/06/2015    Past Surgical History:  Procedure Laterality Date  . FACIAL COSMETIC SURGERY     cysts removed       Home Medications    Prior to Admission medications   Medication Sig Start Date End Date Taking? Authorizing Provider  cloNIDine (CATAPRES) 0.2 MG tablet Take 1 tablet (0.2 mg total) by mouth 2 (two) times daily. 05/14/15  Yes Myrlene Broker, MD  doxycycline (VIBRAMYCIN) 100 MG capsule Take 100 mg by mouth 2 (two) times daily as needed for other. For boils on face/ acne 12/07/15  Yes Historical Provider, MD    HYDROcodone-acetaminophen (NORCO/VICODIN) 5-325 MG tablet Take 1 tablet by mouth every 6 (six) hours as needed for pain. 09/30/15  Yes Historical Provider, MD  lisinopril-hydrochlorothiazide (PRINZIDE,ZESTORETIC) 20-25 MG tablet Take 1 tablet by mouth daily. 05/14/15  Yes Myrlene Broker, MD  hydrALAZINE (APRESOLINE) 25 MG tablet Take 1 tablet (25 mg total) by mouth 3 (three) times daily. Patient not taking: Reported on 12/14/2015 02/13/15   Myrlene Broker, MD    Family History No family history on file.  Social History Social History  Substance Use Topics  . Smoking status: Current Every Day Smoker    Packs/day: 1.00    Types: Cigarettes  . Smokeless tobacco: Never Used  . Alcohol use Yes     Comment: socially     Allergies   Review of patient's allergies indicates no known allergies.   Review of Systems Review of Systems  Constitutional: Positive for diaphoresis, fatigue, fever and malaise/fatigue.  HENT: Negative for congestion.   Respiratory: Positive for shortness of breath. Negative for cough and hemoptysis.   Cardiovascular: Positive for chest pain.  Gastrointestinal: Positive for abdominal pain and vomiting.       Epigastric abdominal pain  Genitourinary: Negative for flank pain.  Musculoskeletal: Negative for back pain.  Skin: Negative for rash.  Neurological: Negative for headaches.  Psychiatric/Behavioral: Negative for confusion.     Physical Exam Updated Vital Signs BP (!) 173/110   Pulse 79   Temp 98.6 F (37 C) (  Oral)   Resp 13   Ht 6\' 1"  (1.854 m)   Wt 84.8 kg   SpO2 100%   BMI 24.67 kg/m   Physical Exam  Constitutional: He is oriented to person, place, and time. He appears well-developed and well-nourished. He appears distressed.  Pleasant, cooperative, ill-appearing  HENT:  Head: Normocephalic and atraumatic.  Eyes: Conjunctivae are normal. No scleral icterus.  Neck: Normal range of motion. Neck supple. No JVD present. No tracheal  deviation present.  Cardiovascular: Normal rate, regular rhythm and intact distal pulses.   Pulmonary/Chest: Effort normal and breath sounds normal. No respiratory distress. He has no rales. He exhibits no tenderness.  Abdominal: Soft. He exhibits no distension. There is tenderness.  Epigastric and LUQ TTP with involuntary guarding  Musculoskeletal: He exhibits no edema.  Neurological: He is alert and oriented to person, place, and time. He exhibits normal muscle tone. Coordination normal.  Skin: Skin is warm. Capillary refill takes 2 to 3 seconds. No rash noted. He is diaphoretic. There is pallor.  Psychiatric: He has a normal mood and affect.  Nursing note and vitals reviewed.    ED Treatments / Results  Labs (all labs ordered are listed, but only abnormal results are displayed) Labs Reviewed  COMPREHENSIVE METABOLIC PANEL - Abnormal; Notable for the following:       Result Value   Chloride 98 (*)    Glucose, Bld 146 (*)    Calcium 11.0 (*)    Total Protein 10.3 (*)    Albumin 5.8 (*)    Total Bilirubin 1.3 (*)    Anion gap 16 (*)    All other components within normal limits  CBC - Abnormal; Notable for the following:    WBC 28.8 (*)    MCH 34.4 (*)    MCHC 36.6 (*)    All other components within normal limits  URINALYSIS, ROUTINE W REFLEX MICROSCOPIC (NOT AT [facility]) - Abnormal; Notable for the following:    Specific Gravity, Urine 1.034 (*)    Glucose, UA 250 (*)    All other components within normal limits  I-STAT CG4 LACTIC ACID, ED - Abnormal; Notable for the following:    Lactic Acid, Venous 3.88 (*)    All other components within normal limits  I-STAT CG4 LACTIC ACID, ED - Abnormal; Notable for the following:    Lactic Acid, Venous 2.80 (*)    All other components within normal limits  CULTURE, BLOOD (ROUTINE X 2)  CULTURE, BLOOD (ROUTINE X 2)  URINE CULTURE  CULTURE, BLOOD (ROUTINE X 2)  CULTURE, BLOOD (ROUTINE X 2)  LIPASE, BLOOD  TROPONIN I  COMPREHENSIVE  METABOLIC PANEL  MAGNESIUM  PHOSPHORUS  AMYLASE  LIPASE, BLOOD  TROPONIN I  TROPONIN I  TROPONIN I  LACTIC ACID, PLASMA  LACTIC ACID, PLASMA  CBC WITH DIFFERENTIAL/PLATELET  PROTIME-INR  APTT  URINALYSIS, ROUTINE W REFLEX MICROSCOPIC (NOT AT [facility])  CBC  BASIC METABOLIC PANEL  PROCALCITONIN  PROCALCITONIN  URINE RAPID DRUG SCREEN, HOSP PERFORMED  ETHANOL    EKG  EKG Interpretation  Date/Time:  Monday December 14 2015 17:38:04 EDT Ventricular Rate:  84 PR Interval:    QRS Duration: 104 QT Interval:  400 QTC Calculation: 473 R Axis:   86 Text Interpretation:  Sinus or ectopic atrial rhythm Borderline prolonged PR interval Nonspecific T abnrm, anterolateral leads Baseline wander in lead(s) I III aVL V2 No significant change since last tracing Confirmed by [facility]  MD, DAVID ([id number]) on 12/14/2015 5:49:16 PM  Radiology Ct Chest W Contrast  Result Date: 12/14/2015 CLINICAL DATA:  Abdominal pain with nausea and vomiting. History of Boerhaave syndrome. EXAM: CT CHEST, ABDOMEN, AND PELVIS WITH CONTRAST TECHNIQUE: Multidetector CT imaging of the chest, abdomen and pelvis was performed following the standard protocol during bolus administration of intravenous contrast. CONTRAST:  ISOVUE-300 IOPAMIDOL (ISOVUE-300) INJECTION 61% COMPARISON:  CT abdomen pelvis 04/23/2015. FINDINGS: CT CHEST FINDINGS Cardiovascular: The heart size is normal. There is no pericardial effusion. There is a normal 3-vessel aortic branching pattern. There is no aortic atherosclerosis. Mediastinum/Nodes: There is pneumomediastinum with air anterior to the esophagus tracking superiorly posterior to the pulmonary artery. Air is also seen anterior to the aortic outflow tract. Thyroid is unremarkable. No mediastinal, hilar or axillary lymphadenopathy. Lungs/Pleura: Multiple posterior biapical cystic spaces. There are multiple nodular subpleural opacities along the left diaphragmatic dome. Lungs are otherwise  normal. No pleural effusion. 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 This is unchanged from the prior study of 04/23/2015 No biliary dilatation. The gallbladder is normal. Pancreas: Pancreatic contours are normal. No abnormal calcifications or mass lesions. No peripancreatic fluid collection. No pancreatic ductal dilatation. Spleen: Normal Adrenals/Urinary Tract: Adrenal glands are normal. No hydronephrosis or solid renal mass. Stomach/Bowel: No dilated small bowel or other evidence of obstruction. No enteric or colonic inflammation. The appendix is normal. Vascular/Lymphatic: No abdominal aortic aneurysm. The inferior vena cava, portal vein, splenic vein and superior mesenteric vein are patent. The aorta, bilateral renal arteries, celiac axis and superior mesenteric artery are patent. The visualized iliac vessels are normal. Mild aortic atherosclerotic calcification. No pelvic or abdominal lymphadenopathy. Reproductive: Prostate is enlarged, measuring 5 cm in transverse dimension. Other: None Musculoskeletal: No bony spinal canal stenosis. No lytic or blastic lesions. IMPRESSION: 1. Small volume pneumomediastinum, with air tracking anterior to the proximal ascending aorta, posterior to the pulmonary arteries and medially along the right mainstem bronchus. The findings likely indicate esophageal rupture. 2. Mild aortic atherosclerosis. 3. Unchanged subpleural nodular opacities of the left of my down. These results will be called to the ordering clinician or representative by the Radiologist Assistant, and communication documented in the PACS or zVision Dashboard. Electronically Signed   By: Deatra Robinson M.D.   On: 12/14/2015 14:57   Ct Abdomen Pelvis W Contrast  Result Date: 12/14/2015 CLINICAL DATA:  Abdominal pain with nausea and vomiting. History of Boerhaave syndrome.  EXAM: CT CHEST, ABDOMEN, AND PELVIS WITH CONTRAST TECHNIQUE: Multidetector CT imaging of the chest, abdomen and pelvis was performed following the standard protocol during bolus administration of intravenous contrast. CONTRAST:  ISOVUE-300 IOPAMIDOL (ISOVUE-300) INJECTION 61% COMPARISON:  CT abdomen pelvis 04/23/2015. FINDINGS: CT CHEST FINDINGS Cardiovascular: The heart size is normal. There is no pericardial effusion. There is a normal 3-vessel aortic branching pattern. There is no aortic atherosclerosis. Mediastinum/Nodes: There is pneumomediastinum with air anterior to the esophagus tracking superiorly posterior to the pulmonary artery. Air is also seen anterior to the aortic outflow tract. Thyroid is unremarkable. No mediastinal, hilar or axillary lymphadenopathy. Lungs/Pleura: Multiple posterior biapical cystic spaces. There are multiple nodular subpleural opacities along the left diaphragmatic dome. Lungs are otherwise normal. No pleural effusion. 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 This is unchanged from the prior study of 04/23/2015 No biliary  dilatation. The gallbladder is normal. Pancreas: Pancreatic contours are normal. No abnormal calcifications or mass lesions. No peripancreatic fluid collection. No pancreatic ductal dilatation. Spleen: Normal Adrenals/Urinary Tract: Adrenal glands are normal. No hydronephrosis or solid renal mass. Stomach/Bowel: No dilated small bowel or other evidence of obstruction. No enteric or colonic inflammation. The appendix is normal. Vascular/Lymphatic: No abdominal aortic aneurysm. The inferior vena cava, portal vein, splenic vein and superior mesenteric vein are patent. The aorta, bilateral renal arteries, celiac axis and superior mesenteric artery are patent. The visualized iliac vessels are normal. Mild aortic  atherosclerotic calcification. No pelvic or abdominal lymphadenopathy. Reproductive: Prostate is enlarged, measuring 5 cm in transverse dimension. Other: None Musculoskeletal: No bony spinal canal stenosis. No lytic or blastic lesions. IMPRESSION: 1. Small volume pneumomediastinum, with air tracking anterior to the proximal ascending aorta, posterior to the pulmonary arteries and medially along the right mainstem bronchus. The findings likely indicate esophageal rupture. 2. Mild aortic atherosclerosis. 3. Unchanged subpleural nodular opacities of the left of my down. These results will be called to the ordering clinician or representative by the Radiologist Assistant, and communication documented in the PACS or zVision Dashboard. Electronically Signed   By: Deatra RobinsonKevin  Herman M.D.   On: 12/14/2015 14:57   Dg Abdomen Acute W/chest  Result Date: 12/14/2015 CLINICAL DATA:  Abdominal pain. EXAM: DG ABDOMEN ACUTE W/ 1V CHEST COMPARISON:  04/24/2015.  CT 04/23/2015. FINDINGS: Soft tissue structures are unremarkable. No acute cardiopulmonary disease. No acute intra-abdominal disease. No bowel distention. Prominent amount of stool noted throughout the colon. No free air. Pelvic calcifications consistent phleboliths. Leads noted over the abdomen in chest. IMPRESSION: Number No acute cardiopulmonary disease. 2. No acute intra-abdominal abnormality. Prominent amount of stool noted throughout colon. Constipation cannot be excluded. Electronically Signed   By: Maisie Fushomas  Register   On: 12/14/2015 12:45    Procedures Procedures (including critical care time)  Medications Ordered in ED Medications  ondansetron (ZOFRAN) injection 4 mg (4 mg Intravenous Given 12/14/15 1737)  pantoprazole (PROTONIX) 80 mg in sodium chloride 0.9 % 250 mL (0.32 mg/mL) infusion (8 mg/hr Intravenous New Bag/Given 12/14/15 1757)  morphine 4 MG/ML injection 4 mg (4 mg Intravenous Given 12/14/15 1645)  0.9 %  sodium chloride infusion (not administered)    aspirin chewable tablet 324 mg (not administered)    Or  aspirin suppository 300 mg (not administered)  hydrALAZINE (APRESOLINE) injection 10 mg (not administered)  dextrose 5 % and 0.45 % NaCl with KCl 20 mEq/L infusion (not administered)  insulin aspart (novoLOG) injection 0-15 Units (not administered)  ondansetron (ZOFRAN-ODT) disintegrating tablet 4 mg (4 mg Oral Given 12/14/15 1056)  sodium chloride 0.9 % bolus 1,000 mL (0 mLs Intravenous Stopped 12/14/15 1333)  ondansetron (ZOFRAN) injection 4 mg (4 mg Intravenous Given 12/14/15 1151)  morphine 4 MG/ML injection 4 mg (4 mg Intravenous Given 12/14/15 1152)  sodium chloride 0.9 % bolus 1,000 mL (0 mLs Intravenous Stopped 12/14/15 1441)  HYDROmorphone (DILAUDID) injection 1 mg (1 mg Intravenous Given 12/14/15 1324)  metoCLOPramide (REGLAN) injection 10 mg (10 mg Intravenous Given 12/14/15 1324)  piperacillin-tazobactam (ZOSYN) IVPB 3.375 g (0 g Intravenous Stopped 12/14/15 1757)  vancomycin (VANCOCIN) IVPB 1000 mg/200 mL premix (0 mg Intravenous Stopped 12/14/15 1621)  iopamidol (ISOVUE-300) 61 % injection 100 mL (100 mLs Intravenous Contrast Given 12/14/15 1355)  HYDROmorphone (DILAUDID) injection 1 mg (1 mg Intravenous Given 12/14/15 1514)  lactated ringers bolus 1,000 mL (1,000 mLs Intravenous New Bag/Given 12/14/15 1624)  lactated ringers bolus 1,000  mL (1,000 mLs Intravenous New Bag/Given 12/14/15 1624)  fentaNYL (SUBLIMAZE) injection 25 mcg (25 mcg Intravenous Given 12/14/15 1739)     Initial Impression / Assessment and Plan / ED Course  I have reviewed the triage vital signs and the nursing notes.  Pertinent labs & imaging results that were available during my care of the patient were reviewed by me and considered in my medical decision making (see chart for details).  Clinical Course   Anthony Atkinson is a 45 y.o. 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 Pt diaphoretic and ill-appearing despite normal HR and high Bp. Cardiothoracic surgery consulted, recommended gastrografin study for further imaging. Admitted to critical care services for further management. Pt s/o vancomycin and zosyn, as well as 2L NS at OSH prior to transfer. Given additional 2L LR in Ed, as well as pain medications and zofran. EKG obtained to evaluate QTC, 473. EKG ST/T changes likely c/w mediastinitis.   Pt condition, course, and admission were discussed with attending physician Dr. Dione Booze.  Final Clinical Impressions(s) / ED Diagnoses   Final diagnoses:  Esophageal perforation    New Prescriptions New Prescriptions   No medications on file     Horald Pollen, MD 12/14/15 1810    Dione Booze, MD 12/15/15 0030

## 2015-12-14 NOTE — H&P (Signed)
PULMONARY / CRITICAL CARE MEDICINE   Name: Anthony Atkinson MRN: [id number] DOB: Apr 21, 1970    ADMISSION DATE:  12/14/2015 CONSULTATION DATE:  12/14/15  REFERRING MD:  Patria Mane - EDP  CHIEF COMPLAINT:  Abd pain, N/V  HISTORY OF PRESENT ILLNESS:  Anthony Atkinson is a 45 y.o. male with PMH of HTN. He apparently woke up at 1AM on 12/14/15 complaining of abd pain.  Shortly thereafter, wife reports that pt began retching violently and complained of worsening pain in his epigastric region.  He did not have chest pain or SOB. On arrival to ED, pt was pale and diaphoretic and appeared very uncomfortable.  He apparently had already had 3 change of clothes due to him saturating each pair with sweat.  Wife reported that pt had similar symptoms in February and at that time, he was found to have esophageal rupture which was treated medically / conservatively.  He had CT of the chest / abdomen / pelvis that demonstrated small volume pneumomediastinum with findings suspicious for esophageal rupture.  He was transferred to [facility] and PCCM was asked to see in consultation.   PAST MEDICAL HISTORY :  He  has a past medical history of Hypertension.  PAST SURGICAL HISTORY: He  has a past surgical history that includes Facial cosmetic surgery.  No Known Allergies  No current facility-administered medications on file prior to encounter.    Current Outpatient Prescriptions on File Prior to Encounter  Medication Sig  . cloNIDine (CATAPRES) 0.2 MG tablet Take 1 tablet (0.2 mg total) by mouth 2 (two) times daily.  Marland Kitchen lisinopril-hydrochlorothiazide (PRINZIDE,ZESTORETIC) 20-25 MG tablet Take 1 tablet by mouth daily.  . hydrALAZINE (APRESOLINE) 25 MG tablet Take 1 tablet (25 mg total) by mouth 3 (three) times daily. (Patient not taking: Reported on 12/14/2015)    FAMILY HISTORY:  His has no family status information on file.  Father is deceased, no info. Mother has HTN.   SOCIAL HISTORY: He  reports that he has  been smoking Cigarettes.  He has been smoking about 1.00 pack per day. He has never used smokeless tobacco. He reports that he drinks alcohol. He reports that he uses drugs, including Marijuana. Married. 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   REVIEW OF SYSTEMS:   Complaining of abd pain/discomfort. (-) SOB, cp. (+) nausea. (+) diaphoretic.  SUBJECTIVE:  As above.   VITAL SIGNS: BP (!) 185/109   Pulse 78   Temp 98.6 F (37 C) (Oral)   Resp 13   Ht 6\' 1"  (1.854 m)   Wt 84.8 kg (187 lb)   SpO2 100%   BMI 24.67 kg/m   HEMODYNAMICS:    VENTILATOR SETTINGS:    INTAKE / OUTPUT: No intake/output data recorded.   PHYSICAL EXAMINATION: General: awake, not in distress. Complaining of abd pain (epigastric pain) Neuro: CN grossly intact. (-) lateralizing signs.  HEENT: PERLA. (-) NVD. (-) oral thrush. (-) crepitus/ sub q air Cardiovascular: good s1/s2. Tachycardic. (-) s3/m/r/g Lungs: good ae. Some crackles at R base. (-) rhonchi/wheeze. (-) accesory muscle use.  Abdomen: dec BS. (+) guarding on my exam. (+) tenderness in epigastric area. He would not allow me to examine.  Musculoskeletal: warm extremities. (-) edema/clubbing/cyanosis Skin: warm and dry.   LABS:  BMET  Recent Labs Lab 12/14/15 1053  NA 137  K 3.6  CL 98*  CO2 23  BUN 15  CREATININE 1.16  GLUCOSE 146*    Electrolytes  Recent Labs Lab 12/14/15 1053  CALCIUM 11.0*  CBC  Recent Labs Lab 12/14/15 1053  WBC 28.8*  HGB 15.4  HCT 42.1  PLT 376    Coag's No results for input(s): APTT, INR in the last 168 hours.  Sepsis Markers  Recent Labs Lab 12/14/15 1217 12/14/15 1548  LATICACIDVEN 3.88* 2.80*    ABG No results for input(s): PHART, PCO2ART, PO2ART in the last 168 hours.  Liver Enzymes  Recent Labs Lab 12/14/15 1053  AST 25  ALT 17  ALKPHOS 64  BILITOT 1.3*  ALBUMIN 5.8*    Cardiac Enzymes  Recent Labs Lab 12/14/15 1456  TROPONINI <0.03    Glucose No  results for input(s): GLUCAP in the last 168 hours.  Imaging Ct Chest W Contrast  Result Date: 12/14/2015 CLINICAL DATA:  Abdominal pain with nausea and vomiting. History of Boerhaave syndrome. EXAM: CT CHEST, ABDOMEN, AND PELVIS WITH CONTRAST TECHNIQUE: Multidetector CT imaging of the chest, abdomen and pelvis was performed following the standard protocol during bolus administration of intravenous contrast. CONTRAST:  ISOVUE-300 IOPAMIDOL (ISOVUE-300) INJECTION 61% COMPARISON:  CT abdomen pelvis 04/23/2015. FINDINGS: CT CHEST FINDINGS Cardiovascular: The heart size is normal. There is no pericardial effusion. There is a normal 3-vessel aortic branching pattern. There is no aortic atherosclerosis. Mediastinum/Nodes: There is pneumomediastinum with air anterior to the esophagus tracking superiorly posterior to the pulmonary artery. Air is also seen anterior to the aortic outflow tract. Thyroid is unremarkable. No mediastinal, hilar or axillary lymphadenopathy. Lungs/Pleura: Multiple posterior biapical cystic spaces. There are multiple nodular subpleural opacities along the left diaphragmatic dome. Lungs are otherwise normal. No pleural effusion. 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 This is unchanged from the prior study of 04/23/2015 No biliary dilatation. The gallbladder is normal. Pancreas: Pancreatic contours are normal. No abnormal calcifications or mass lesions. No peripancreatic fluid collection. No pancreatic ductal dilatation. Spleen: Normal Adrenals/Urinary Tract: Adrenal glands are normal. No hydronephrosis or solid renal mass. Stomach/Bowel: No dilated small bowel or other evidence of obstruction. No enteric or colonic inflammation. The appendix is normal. Vascular/Lymphatic: No abdominal aortic aneurysm. The inferior vena cava, portal vein, splenic  vein and superior mesenteric vein are patent. The aorta, bilateral renal arteries, celiac axis and superior mesenteric artery are patent. The visualized iliac vessels are normal. Mild aortic atherosclerotic calcification. No pelvic or abdominal lymphadenopathy. Reproductive: Prostate is enlarged, measuring 5 cm in transverse dimension. Other: None Musculoskeletal: No bony spinal canal stenosis. No lytic or blastic lesions. IMPRESSION: 1. Small volume pneumomediastinum, with air tracking anterior to the proximal ascending aorta, posterior to the pulmonary arteries and medially along the right mainstem bronchus. The findings likely indicate esophageal rupture. 2. Mild aortic atherosclerosis. 3. Unchanged subpleural nodular opacities of the left of my down. These results will be called to the ordering clinician or representative by the Radiologist Assistant, and communication documented in the PACS or zVision Dashboard. Electronically Signed   By: Deatra Robinson M.D.   On: 12/14/2015 14:57   Ct Abdomen Pelvis W Contrast  Result Date: 12/14/2015 CLINICAL DATA:  Abdominal pain with nausea and vomiting. History of Boerhaave syndrome. EXAM: CT CHEST, ABDOMEN, AND PELVIS WITH CONTRAST TECHNIQUE: Multidetector CT imaging of the chest, abdomen and pelvis was performed following the standard protocol during bolus administration of intravenous contrast. CONTRAST:  ISOVUE-300 IOPAMIDOL (ISOVUE-300) INJECTION 61% COMPARISON:  CT abdomen pelvis 04/23/2015. FINDINGS: CT CHEST FINDINGS Cardiovascular: The heart size  is normal. There is no pericardial effusion. There is a normal 3-vessel aortic branching pattern. There is no aortic atherosclerosis. Mediastinum/Nodes: There is pneumomediastinum with air anterior to the esophagus tracking superiorly posterior to the pulmonary artery. Air is also seen anterior to the aortic outflow tract. Thyroid is unremarkable. No mediastinal, hilar or axillary lymphadenopathy. Lungs/Pleura:  Multiple posterior biapical cystic spaces. There are multiple nodular subpleural opacities along the left diaphragmatic dome. Lungs are otherwise normal. No pleural effusion. 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 This is unchanged from the prior study of 04/23/2015 No biliary dilatation. The gallbladder is normal. Pancreas: Pancreatic contours are normal. No abnormal calcifications or mass lesions. No peripancreatic fluid collection. No pancreatic ductal dilatation. Spleen: Normal Adrenals/Urinary Tract: Adrenal glands are normal. No hydronephrosis or solid renal mass. Stomach/Bowel: No dilated small bowel or other evidence of obstruction. No enteric or colonic inflammation. The appendix is normal. Vascular/Lymphatic: No abdominal aortic aneurysm. The inferior vena cava, portal vein, splenic vein and superior mesenteric vein are patent. The aorta, bilateral renal arteries, celiac axis and superior mesenteric artery are patent. The visualized iliac vessels are normal. Mild aortic atherosclerotic calcification. No pelvic or abdominal lymphadenopathy. Reproductive: Prostate is enlarged, measuring 5 cm in transverse dimension. Other: None Musculoskeletal: No bony spinal canal stenosis. No lytic or blastic lesions. IMPRESSION: 1. Small volume pneumomediastinum, with air tracking anterior to the proximal ascending aorta, posterior to the pulmonary arteries and medially along the right mainstem bronchus. The findings likely indicate esophageal rupture. 2. Mild aortic atherosclerosis. 3. Unchanged subpleural nodular opacities of the left of my down. These results will be called to the ordering clinician or representative by the Radiologist Assistant, and communication documented in the PACS or zVision Dashboard. Electronically Signed   By: Deatra Robinson M.D.   On: 12/14/2015 14:57    Dg Abdomen Acute W/chest  Result Date: 12/14/2015 CLINICAL DATA:  Abdominal pain. EXAM: DG ABDOMEN ACUTE W/ 1V CHEST COMPARISON:  04/24/2015.  CT 04/23/2015. FINDINGS: Soft tissue structures are unremarkable. No acute cardiopulmonary disease. No acute intra-abdominal disease. No bowel distention. Prominent amount of stool noted throughout the colon. No free air. Pelvic calcifications consistent phleboliths. Leads noted over the abdomen in chest. IMPRESSION: Number No acute cardiopulmonary disease. 2. No acute intra-abdominal abnormality. Prominent amount of stool noted throughout colon. Constipation cannot be excluded. Electronically Signed   By: Maisie Fus  Register   On: 12/14/2015 12:45     STUDIES:  CT cest / abdomen / pelvis 10/2 >  volume pneumomediastinum with findings suspicious for esophageal rupture.  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 10/2 > re-admitted with same.  LINES/TUBES: None.  DISCUSSION: 45 y.o. 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 He presents in a similar fashion.  Has epigastric pain, diaphoresis, ct scan with pneumomediastinum.  This is the 4th time this happened.  Pt left AMA last 04/2015.  He has not followed up with GI as outpt.   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 4th time this happened. Pt with Pneumomediastinum on chest ct scan GI prophylaxis. Nutrition. P:   Management per  CVTS. Plan for Gastrografin study with esophagogram.  I discussed case with Dr. Tyrone SageGerhardt.  Surgery will f/u on esophagogram. If pt has an esophageal leak, likely will go to OR.  If not, conservative management.  SUP:  Pantoprazole. NPO.  CARDIOVASCULAR A:  Hx uncontrolled HTN - currently hypertensive with SBP in 170's. P:  Labetalol PRN, Hydralazine PRN. Trend lactate, troponin. Hold preadmission clonidine, hydralazine, lisinopril-hctz.  PULMONARY A: Questionable COPD given hx tobacco use. P:   Keep O2 sats > 88%. Smoking cessation done.   RENAL A:   AGMA - lactate. P:   D5 1/2 NS @ 100 mls/hr. BMP in AM.  HEMATOLOGIC A:   VTE Prophylaxis. P:  SCD's. CBC in AM.  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 At risk for mediastinitis. P:   Abx as above (Vanc / Zosyn/Fluconazole).  Follow cultures as above. PCT algorithm to limit abx exposure. Sepsis - Repeat Assessment done now. Pt has received 5L of IVF as initial resuscitation. He is adequately hydrated.  BP is actually HTNsive. Cont maintenance IVF for now.    ENDOCRINE A:   No acute issues.   P:   No interventions required. CBG q4 while NPO  NEUROLOGIC A:   Pain. Substance abuse - UDS in Feb positive for opiates and THC. P:   PRN fentanyl. Assess UDS, ethanol.   Family updated: Wife and pt updated at bedside. Emphasized the need to follow up.  He left AMA last 04/2015. This problem has happened 4 times already.  Needs to f/u with GI as well.   Interdisciplinary Family Meeting v Palliative Care Meeting:  Due by: 10/9  CC time: 35 minutes.  Case d/w Dr. Tyrone SageGerhardt.   Pollie MeyerJ. Angelo A de Dios, MD 12/14/2015, 5:58 PM  Pulmonary and Critical Care Pager [phone] After 3 pm or if no answer, call [phone]

## 2015-12-14 NOTE — Progress Notes (Signed)
Patient ID: Anthony LoopBrian Atkinson, male   DOB: 07/02/1970, 45 y.o.   MRN: [id number] EVENING ROUNDS NOTE :     [address]       [place] [id number]             [phone]                     Total Length of Stay:  LOS: 0 days  BP (!) 172/95 (BP Location: Right Arm)   Pulse 84   Temp 98.5 F (36.9 C) (Oral)   Resp (!) 21   Ht 6\' 1"  (1.854 m)   Wt 187 lb (84.8 kg)   SpO2 99%   BMI 24.67 kg/m   .Intake/Output      10/02 0701 - 10/03 0700   I.V. (mL/kg) 2000 (23.6)   IV Piggyback 2250   Total Intake(mL/kg) 4250 (50.1)   Net +4250         . dextrose 5 % and 0.45 % NaCl with KCl 20 mEq/L    . pantoprozole (PROTONIX) infusion 8 mg/hr (12/14/15 1757)     Lab Results  Component Value Date   WBC 26.6 (H) 12/14/2015   HGB 13.7 12/14/2015   HCT 38.8 (L) 12/14/2015   PLT 303 12/14/2015   GLUCOSE 132 (H) 12/14/2015   ALT 14 (L) 12/14/2015   AST 22 12/14/2015   NA 139 12/14/2015   K 3.8 12/14/2015   CL 103 12/14/2015   CREATININE 1.00 12/14/2015   BUN 9 12/14/2015   CO2 23 12/14/2015   TSH 0.939 01/07/2015   INR 1.07 12/14/2015   Patient seen again, reviewed swallow with radiologist  Follow  Up CT  after Gastrogaffin swallow reviewed but not read yet, at this point after prolonged work up do not see indication for chest surgery Discussed with Dr Levada SchillingSummers Keep npo for now with [place]   Delight OvensEdward B Thoren Hosang MD  Beeper [phone] Office [phone] 12/14/2015 8:29 PM

## 2015-12-15 ENCOUNTER — Encounter (HOSPITAL_COMMUNITY): Payer: Self-pay

## 2015-12-15 ENCOUNTER — Inpatient Hospital Stay (HOSPITAL_COMMUNITY): Payer: 59

## 2015-12-15 LAB — CBC
HEMATOCRIT: 39.8 % (ref 39.0–52.0)
HEMOGLOBIN: 14 g/dL (ref 13.0–17.0)
MCH: 33.7 pg (ref 26.0–34.0)
MCHC: 35.2 g/dL (ref 30.0–36.0)
MCV: 95.9 fL (ref 78.0–100.0)
Platelets: 321 10*3/uL (ref 150–400)
RBC: 4.15 MIL/uL — ABNORMAL LOW (ref 4.22–5.81)
RDW: 12.9 % (ref 11.5–15.5)
WBC: 24.7 10*3/uL — AB (ref 4.0–10.5)

## 2015-12-15 LAB — GLUCOSE, CAPILLARY
GLUCOSE-CAPILLARY: 127 mg/dL — AB (ref 65–99)
GLUCOSE-CAPILLARY: 132 mg/dL — AB (ref 65–99)
GLUCOSE-CAPILLARY: 134 mg/dL — AB (ref 65–99)
GLUCOSE-CAPILLARY: 151 mg/dL — AB (ref 65–99)
Glucose-Capillary: 135 mg/dL — ABNORMAL HIGH (ref 65–99)
Glucose-Capillary: 119 mg/dL — ABNORMAL HIGH (ref 65–99)

## 2015-12-15 LAB — URINE CULTURE: Culture: NO GROWTH

## 2015-12-15 LAB — BASIC METABOLIC PANEL
ANION GAP: 11 (ref 5–15)
BUN: 11 mg/dL (ref 6–20)
CO2: 22 mmol/L (ref 22–32)
Calcium: 9.9 mg/dL (ref 8.9–10.3)
Chloride: 103 mmol/L (ref 101–111)
Creatinine, Ser: 1.12 mg/dL (ref 0.61–1.24)
GFR calc non Af Amer: >60 mL/min (ref >60)
GLUCOSE: 121 mg/dL — AB (ref 65–99)
POTASSIUM: 3.2 mmol/L — AB (ref 3.5–5.1)
Sodium: 136 mmol/L (ref 135–145)

## 2015-12-15 LAB — MRSA PCR SCREENING: MRSA BY PCR: NEGATIVE

## 2015-12-15 LAB — TROPONIN I
TROPONIN I: 0.03 ng/mL — AB (ref <0.03)
Troponin I: 0.03 ng/mL (ref <0.03)

## 2015-12-15 LAB — LACTIC ACID, PLASMA: Lactic Acid, Venous: 1.9 mmol/L (ref 0.5–1.9)

## 2015-12-15 LAB — PROCALCITONIN: Procalcitonin: 0.38 ng/mL

## 2015-12-15 MED ORDER — FENTANYL CITRATE (PF) 100 MCG/2ML IJ SOLN
25.0000 ug | Freq: Once | INTRAMUSCULAR | Status: AC
  Administered 2015-12-15: 25 ug via INTRAVENOUS
  Filled 2015-12-15: qty 2

## 2015-12-15 MED ORDER — MORPHINE SULFATE (PF) 2 MG/ML IV SOLN
2.0000 mg | INTRAVENOUS | Status: DC | PRN
  Administered 2015-12-15 – 2015-12-16 (×5): 2 mg via INTRAVENOUS
  Filled 2015-12-15 (×5): qty 1

## 2015-12-15 MED ORDER — HYDRALAZINE HCL 20 MG/ML IJ SOLN
20.0000 mg | INTRAMUSCULAR | Status: DC | PRN
  Administered 2015-12-15: 10 mg via INTRAVENOUS
  Administered 2015-12-15 – 2015-12-16 (×3): 20 mg via INTRAVENOUS
  Filled 2015-12-15 (×4): qty 1

## 2015-12-15 NOTE — Progress Notes (Signed)
[facility] called d/t ongoing HTN (SBP 170s) despite PRN hydralazine and PRN labetolol. Per Dr. Molli KnockYacoub, no new orders for that at this time. Also discussed ongoing pain, MD ordered one time dose of fentanyl PRN.   Will continue to monitor.  Peyton Bottomsachel R Keyerra Lamere, RN 3:17 AM 12/15/15

## 2015-12-15 NOTE — Progress Notes (Signed)
[facility] called, notified of ongoing HTN (SBP 170s-180s) despite PRN hydralazine 10mg . Dr. Arsenio LoaderSommer responded at 21:47, gave PRN labetolol. Okay with giving ordered NS 1000cc bolus after BP comes back down WDL.   Will continue to monitor.  Peyton Bottomsachel R Clements Toro, RN

## 2015-12-15 NOTE — Care Management Note (Signed)
Case Management Note  Patient Details  Name: Anthony Atkinson MRN: [id number] Date of Birth: 11/18/1970  Subjective/Objective:    Pt admitted with abdominal pain, chills and emesis - possible esophagus rupture                Action/Plan:  PTA independent from home with wife.  CM will continue to follow for discharge needs   Expected Discharge Date:                  Expected Discharge Plan:  Home/Self Care  In-House Referral:     Discharge planning Services  CM Consult  Post Acute Care Choice:    Choice offered to:     DME Arranged:    DME Agency:     HH Arranged:    HH Agency:     Status of Service:  In process, will continue to follow  If discussed at Long Length of Stay Meetings, dates discussed:    Additional Comments:  Anthony Atkinson, Anthony Kitzmiller S, RN 12/15/2015, 10:43 AM

## 2015-12-15 NOTE — Progress Notes (Signed)
[facility] called at 20:52, Dr. Arsenio LoaderSommer responded at 20:57. Notified of critical lactic and troponin values and refractory N/V. Given PRN Phenergan orders. Will continue to monitor LA and troponins.  Peyton Bottomsachel R Dariyon Urquilla, RN

## 2015-12-15 NOTE — Plan of Care (Signed)
Problem: Nutrition: Goal: Adequate nutrition will be maintained Outcome: Not Progressing Pt currently NPO. Will address need for NPO status with MD in AM.

## 2015-12-15 NOTE — Progress Notes (Signed)
PULMONARY / CRITICAL CARE MEDICINE   Name: Anthony Atkinson MRN: [id number] DOB: 07/30/1970    ADMISSION DATE:  12/14/2015 CONSULTATION DATE:  12/14/15  REFERRING MD:  Patria Maneampos - EDP  CHIEF COMPLAINT:  Abd pain, N/V  HISTORY OF PRESENT ILLNESS:  Anthony Atkinson is a 45 y.o. male with PMH of HTN. He apparently woke up at 1AM on 12/14/15 complaining of abd pain.  Shortly thereafter, wife reports that pt began retching violently and complained of worsening pain in his epigastric region.  He did not have chest pain or SOB. On arrival to ED, pt was pale and diaphoretic and appeared very uncomfortable.  He apparently had already had 3 change of clothes due to him saturating each pair with sweat.  Wife reported that pt had similar symptoms in February and at that time, he was found to have esophageal rupture which was treated medically / conservatively.  He had CT of the chest / abdomen / pelvis that demonstrated small volume pneumomediastinum with findings suspicious for esophageal rupture.  He was transferred to [facility] and PCCM was asked to admit.    SUBJECTIVE:  Over all, he states he feels better. (-)n/v/diarrhea.  Still with epigastric pain but better. HA this am. HTN on IV meds.   VITAL SIGNS: BP (!) 172/95   Pulse (!) 103   Temp 98.4 F (36.9 C) (Oral)   Resp (!) 28   Ht 6\' 1"  (1.854 m)   Wt 81.4 kg (179 lb 7.3 oz)   SpO2 97%   BMI 23.68 kg/m   HEMODYNAMICS:    VENTILATOR SETTINGS:    INTAKE / OUTPUT: I/O last 3 completed shifts: In: 6764.4 [I.V.:4014.4; IV Piggyback:2750] Out: 1445 [Urine:1445]   PHYSICAL EXAMINATION: General: awake, not in distress. Complaining of abd pain (epigastric pain) but less severe.  Neuro: CN grossly intact. (-) lateralizing signs.  HEENT: PERLA. (-) NVD. (-) oral thrush. (-) crepitus/ sub q air Cardiovascular: good s1/s2. Tachycardic. (-) s3/m/r/g Lungs: good ae.  (-) crackles.  (-) rhonchi/wheeze. (-) accesory muscle use.  Abdomen: dec BS.  (+) guarding on my exam. (+) tenderness in epigastric area. Similar to 10/2.  Musculoskeletal: warm extremities. (-) edema/clubbing/cyanosis Skin: warm and dry.   LABS:  BMET  Recent Labs Lab 12/14/15 1053 12/14/15 1919 12/15/15 0521  NA 137 139 136  K 3.6 3.8 3.2*  CL 98* 103 103  CO2 23 23 22   BUN 15 9 11   CREATININE 1.16 1.00 1.12  GLUCOSE 146* 132* 121*    Electrolytes  Recent Labs Lab 12/14/15 1053 12/14/15 1919 12/15/15 0521  CALCIUM 11.0* 9.6 9.9  MG  --  1.7  --   PHOS  --  2.8  --     CBC  Recent Labs Lab 12/14/15 1053 12/14/15 1919 12/15/15 0521  WBC 28.8* 26.6* 24.7*  HGB 15.4 13.7 14.0  HCT 42.1 38.8* 39.8  PLT 376 303 321    Coag's  Recent Labs Lab 12/14/15 1919  APTT 31  INR 1.07    Sepsis Markers  Recent Labs Lab 12/14/15 1548 12/14/15 1746 12/14/15 1919 12/14/15 2014 12/15/15 0521  LATICACIDVEN 2.80* 3.5*  --  5.0*  --   PROCALCITON  --   --  0.29  --  0.38    ABG No results for input(s): PHART, PCO2ART, PO2ART in the last 168 hours.  Liver Enzymes  Recent Labs Lab 12/14/15 1053 12/14/15 1919  AST 25 22  ALT 17 14*  ALKPHOS 64 55  BILITOT 1.3*  0.6  ALBUMIN 5.8* 4.3    Cardiac Enzymes  Recent Labs Lab 12/14/15 1919 12/14/15 2337 12/15/15 0521  TROPONINI 0.03* 0.03* 0.03*    Glucose  Recent Labs Lab 12/14/15 2057 12/15/15 0005 12/15/15 0418  GLUCAP 118* 132* 135*    Imaging Ct Chest Without Contrast  Result Date: 12/14/2015 CLINICAL DATA:  Difficulty swallowing beginning this morning. Question esophageal perforation. EXAM: CT CHEST WITHOUT CONTRAST TECHNIQUE: Multidetector CT imaging of the chest was performed following the standard protocol without IV contrast. COMPARISON:  CT chest, abdomen and pelvis and esophagram earlier today. FINDINGS: Cardiovascular: Unremarkable. Mediastinum/Nodes: A small amount of pneumomediastinum is identified as seen on the CT earlier today. On axial images 133 and  134 contrast penetrates into the anterior wall of the esophagus but there is no extravasation of contrast into the mediastinum or pleural space. The walls of the distal esophagus are thickened. Lungs/Pleura: The lungs are emphysematous but clear. Upper Abdomen: Unremarkable. Musculoskeletal: No focal abnormality. IMPRESSION: Findings concerning for defect in the anterior distal esophagus with contrast seen within the esophageal wall. There may be a microperforation causing pneumomediastinum but no contrast extravasates into the mediastinum or pleural space. The walls of the distal esophagus appear thickened suggestive of esophagitis. Electronically Signed   By: Drusilla Kanner M.D.   On: 12/14/2015 20:34   Ct Chest W Contrast  Result Date: 12/14/2015 CLINICAL DATA:  Abdominal pain with nausea and vomiting. History of Boerhaave syndrome. EXAM: CT CHEST, ABDOMEN, AND PELVIS WITH CONTRAST TECHNIQUE: Multidetector CT imaging of the chest, abdomen and pelvis was performed following the standard protocol during bolus administration of intravenous contrast. CONTRAST:  ISOVUE-300 IOPAMIDOL (ISOVUE-300) INJECTION 61% COMPARISON:  CT abdomen pelvis 04/23/2015. FINDINGS: CT CHEST FINDINGS Cardiovascular: The heart size is normal. There is no pericardial effusion. There is a normal 3-vessel aortic branching pattern. There is no aortic atherosclerosis. Mediastinum/Nodes: There is pneumomediastinum with air anterior to the esophagus tracking superiorly posterior to the pulmonary artery. Air is also seen anterior to the aortic outflow tract. Thyroid is unremarkable. No mediastinal, hilar or axillary lymphadenopathy. Lungs/Pleura: Multiple posterior biapical cystic spaces. There are multiple nodular subpleural opacities along the left diaphragmatic dome. Lungs are otherwise normal. No pleural effusion. 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 This is unchanged from the prior study of 04/23/2015 No biliary dilatation. The gallbladder is normal. Pancreas: Pancreatic contours are normal. No abnormal calcifications or mass lesions. No peripancreatic fluid collection. No pancreatic ductal dilatation. Spleen: Normal Adrenals/Urinary Tract: Adrenal glands are normal. No hydronephrosis or solid renal mass. Stomach/Bowel: No dilated small bowel or other evidence of obstruction. No enteric or colonic inflammation. The appendix is normal. Vascular/Lymphatic: No abdominal aortic aneurysm. The inferior vena cava, portal vein, splenic vein and superior mesenteric vein are patent. The aorta, bilateral renal arteries, celiac axis and superior mesenteric artery are patent. The visualized iliac vessels are normal. Mild aortic atherosclerotic calcification. No pelvic or abdominal lymphadenopathy. Reproductive: Prostate is enlarged, measuring 5 cm in transverse dimension. Other: None Musculoskeletal: No bony spinal canal stenosis. No lytic or blastic lesions. IMPRESSION: 1. Small volume pneumomediastinum, with air tracking anterior to the proximal ascending aorta, posterior to the pulmonary arteries and medially along the right mainstem bronchus. The findings likely indicate esophageal rupture. 2. Mild aortic atherosclerosis. 3. Unchanged subpleural nodular opacities of the left of my down. These results will be called  to the ordering clinician or representative by the Radiologist Assistant, and communication documented in the PACS or zVision Dashboard. Electronically Signed   By: Deatra Robinson M.D.   On: 12/14/2015 14:57   Ct Abdomen Pelvis W Contrast  Result Date: 12/14/2015 CLINICAL DATA:  Abdominal pain with nausea and vomiting. History of Boerhaave syndrome. EXAM: CT CHEST, ABDOMEN, AND PELVIS WITH CONTRAST TECHNIQUE: Multidetector CT imaging of the chest, abdomen and pelvis was performed  following the standard protocol during bolus administration of intravenous contrast. CONTRAST:  ISOVUE-300 IOPAMIDOL (ISOVUE-300) INJECTION 61% COMPARISON:  CT abdomen pelvis 04/23/2015. FINDINGS: CT CHEST FINDINGS Cardiovascular: The heart size is normal. There is no pericardial effusion. There is a normal 3-vessel aortic branching pattern. There is no aortic atherosclerosis. Mediastinum/Nodes: There is pneumomediastinum with air anterior to the esophagus tracking superiorly posterior to the pulmonary artery. Air is also seen anterior to the aortic outflow tract. Thyroid is unremarkable. No mediastinal, hilar or axillary lymphadenopathy. Lungs/Pleura: Multiple posterior biapical cystic spaces. There are multiple nodular subpleural opacities along the left diaphragmatic dome. Lungs are otherwise normal. No pleural effusion. 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 This is unchanged from the prior study of 04/23/2015 No biliary dilatation. The gallbladder is normal. Pancreas: Pancreatic contours are normal. No abnormal calcifications or mass lesions. No peripancreatic fluid collection. No pancreatic ductal dilatation. Spleen: Normal Adrenals/Urinary Tract: Adrenal glands are normal. No hydronephrosis or solid renal mass. Stomach/Bowel: No dilated small bowel or other evidence of obstruction. No enteric or colonic inflammation. The appendix is normal. Vascular/Lymphatic: No abdominal aortic aneurysm. The inferior vena cava, portal vein, splenic vein and superior mesenteric vein are patent. The aorta, bilateral renal arteries, celiac axis and superior mesenteric artery are patent. The visualized iliac vessels are normal. Mild aortic atherosclerotic calcification. No pelvic or abdominal lymphadenopathy. Reproductive: Prostate is enlarged, measuring 5 cm in transverse  dimension. Other: None Musculoskeletal: No bony spinal canal stenosis. No lytic or blastic lesions. IMPRESSION: 1. Small volume pneumomediastinum, with air tracking anterior to the proximal ascending aorta, posterior to the pulmonary arteries and medially along the right mainstem bronchus. The findings likely indicate esophageal rupture. 2. Mild aortic atherosclerosis. 3. Unchanged subpleural nodular opacities of the left of my down. These results will be called to the ordering clinician or representative by the Radiologist Assistant, and communication documented in the PACS or zVision Dashboard. Electronically Signed   By: Deatra Robinson M.D.   On: 12/14/2015 14:57   Dg Chest Port 1 View  Result Date: 12/15/2015 CLINICAL DATA:  45 year old male with dyspnea and cough. Subsequent encounter. EXAM: PORTABLE CHEST 1 VIEW COMPARISON:  12/14/2015 CT and chest x-ray. FINDINGS: CT detected pneumomediastinum not appreciated by plain film exam. Lungs clear. Heart size within normal limits. IMPRESSION: CT detected pneumomediastinum not appreciated by plain film exam. Lungs clear. Electronically Signed   By: Lacy Duverney M.D.   On: 12/15/2015 07:45   Dg Abdomen Acute W/chest  Result Date: 12/14/2015 CLINICAL DATA:  Abdominal pain. EXAM: DG ABDOMEN ACUTE W/ 1V CHEST COMPARISON:  04/24/2015.  CT 04/23/2015. FINDINGS: Soft tissue structures are unremarkable. No acute cardiopulmonary disease. No acute intra-abdominal disease. No bowel distention. Prominent amount of stool noted throughout the colon. No free air. Pelvic calcifications consistent phleboliths. Leads noted over the abdomen in chest. IMPRESSION: Number No acute cardiopulmonary disease. 2. No acute intra-abdominal abnormality. Prominent amount of stool  noted throughout colon. Constipation cannot be excluded. Electronically Signed   By: Maisie Fus  Register   On: 12/14/2015 12:45   Dg Esophagus W/water Marchelle Folks Cm  Result Date: 12/14/2015 CLINICAL DATA:  45 year old  male with pneumomediastinum seen on the earlier CT. EXAM: ESOPHOGRAM/BARIUM SWALLOW TECHNIQUE: Single contrast examination was performed using  Isovue-300. FLUOROSCOPY TIME:  Fluoroscopy Time:  48 seconds Number of Acquired Spot Images: 7 COMPARISON:  CT dated 12/14/2015 FINDINGS: Evaluation is limited as the patient could not tolerate this study and could not swallow adequate amount of contrast. Scout radiograph demonstrates small amount of pneumomediastinum. The cardiac silhouette is within normal limits. The aorta is slightly tortuous. The visualized lungs are clear. Oral contrast noted throughout the esophagus entering the stomach. No extraluminal contrast or extension of contrast outside of the esophageal contour within the mediastinum identified. Gastroesophageal reflux noted during coughing. Patient subsequently vomited. IMPRESSION: No definite extraluminal contrast identified to suggest leak. Electronically Signed   By: Elgie Collard M.D.   On: 12/14/2015 19:32     STUDIES:  CT cest / abdomen / pelvis 10/2 >  volume pneumomediastinum with findings suspicious for esophageal rupture. Gastrografin Esophagogram 10/2 > defect in ant distal esophagus. No extravasation of contrast. Esophagitis.   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 10/2 > re-admitted with same.  LINES/TUBES: None.  DISCUSSION: 45 y.o. 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 He presents in a similar fashion.  Has epigastric pain, diaphoresis, ct scan with pneumomediastinum.  This is the 4th time this happened.  Pt left AMA last 04/2015.  He has not followed up with GI as outpt.   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 4th time this happened.  Poor follow up.  Pneumomediastinum on chest ct scan. Gastrografin 10/2 showed possible distal esophageal tear/esophagitis and NO extravasation of contrast.  GI prophylaxis. Nutrition. P:   Management per CVTS. Keep NPO. IV abx.  Conservative management for now.  SUP: Pantoprazole. Keep in ICU.   CARDIOVASCULAR A:  Hx uncontrolled HTN  P:  Labetalol PRN, Hydralazine PRN. >> will increase hydralazine dose from 10 to 20 mg prn.  Trend lactate, troponin. Hold preadmission clonidine, hydralazine, lisinopril-hctz.  PULMONARY A: Questionable COPD given hx tobacco use. P:   Keep O2 sats > 88%. Smoking cessation done.   RENAL A:   AGMA - lactate. P:   D5 1/2 NS @ 100 mls/hr. BMP in AM.  HEMATOLOGIC A:   VTE Prophylaxis. P:  SCD's. CBC in AM.  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 Mediastinitis/Esophagitis P:   Abx as above (Vanc / Zosyn/Fluconazole).  Follow cultures as above. PCT algorithm to limit abx exposure.   ENDOCRINE A:   No acute issues.   P:   No interventions required. CBG q4 while NPO  NEUROLOGIC A:   Pain. Substance abuse - UDS (+) for MJ P:   PRN morphine for pian for now.    Family updated: Wife and pt updated at bedside.   Interdisciplinary Family Meeting v Palliative Care Meeting:  Due by: 10/9  CC time: 30 minutes.   Pollie Meyer, MD 12/15/2015,  9:20 AM [facility] Pager [phone] After 3 pm or if no answer, call [phone]

## 2015-12-15 NOTE — Progress Notes (Signed)
Patient ID: Anthony Atkinson, male   DOB: 1970-12-02, 45 y.o.   MRN: [id number] TCTS DAILY ICU PROGRESS NOTE                   [address]            [place] [address]          [phone]       Total Length of Stay:  LOS: 1 day   Subjective: Feels better, diaphoresis cleared   Objective: Vital signs in last 24 hours: Temp:  [98.3 F (36.8 C)-98.7 F (37.1 C)] 98.4 F (36.9 C) (10/03 1500) Pulse Rate:  [78-128] 99 (10/03 1730) Cardiac Rhythm: Normal sinus rhythm (10/03 1600) Resp:  [11-28] 15 (10/03 1730) BP: (148-189)/(83-151) 166/104 (10/03 1730) SpO2:  [94 %-100 %] 96 % (10/03 1730) Weight:  [179 lb 7.3 oz (81.4 kg)] 179 lb 7.3 oz (81.4 kg) (10/03 0500)  Filed Weights   12/14/15 1244 12/15/15 0500  Weight: 187 lb (84.8 kg) 179 lb 7.3 oz (81.4 kg)    Weight change:    Hemodynamic parameters for last 24 hours:    Intake/Output from previous day: 10/02 0701 - 10/03 0700 In: 6764.4 [I.V.:4014.4; IV Piggyback:2750] Out: 1445 [Urine:1445]  Intake/Output this shift: Total I/O In: 1960 [I.V.:1560; IV Piggyback:400] Out: 1100 [Urine:800; Emesis/NG output:300]  Current Meds: Scheduled Meds: . fluconazole (DIFLUCAN) IV  400 mg Intravenous Q24H  . insulin aspart  0-15 Units Subcutaneous Q4H  . piperacillin-tazobactam (ZOSYN)  IV  3.375 g Intravenous Q8H  . vancomycin  1,000 mg Intravenous Q8H   Continuous Infusions: . dextrose 5 % and 0.45 % NaCl with KCl 20 mEq/L 100 mL/hr at 12/15/15 1800  . pantoprozole (PROTONIX) infusion 8 mg/hr (12/15/15 1800)   PRN Meds:.sodium chloride, hydrALAZINE, labetalol, morphine injection, ondansetron (ZOFRAN) IV, promethazine  General appearance: alert and cooperative Neurologic: intact Heart: regular rate and rhythm, S1, S2 normal, no murmur, click, rub or gallop Lungs: diminished breath sounds bibasilar Abdomen: soft, non-tender; bowel sounds normal; no masses,  no organomegaly Extremities: extremities  normal, atraumatic, no cyanosis or edema and Homans sign is negative, no sign of DVT  Lab Results: CBC: Recent Labs  12/14/15 1919 12/15/15 0521  WBC 26.6* 24.7*  HGB 13.7 14.0  HCT 38.8* 39.8  PLT 303 321   BMET:  Recent Labs  12/14/15 1919 12/15/15 0521  NA 139 136  K 3.8 3.2*  CL 103 103  CO2 23 22  GLUCOSE 132* 121*  BUN 9 11  CREATININE 1.00 1.12  CALCIUM 9.6 9.9    CMET: Lab Results  Component Value Date   WBC 24.7 (H) 12/15/2015   HGB 14.0 12/15/2015   HCT 39.8 12/15/2015   PLT 321 12/15/2015   GLUCOSE 121 (H) 12/15/2015   ALT 14 (L) 12/14/2015   AST 22 12/14/2015   NA 136 12/15/2015   K 3.2 (L) 12/15/2015   CL 103 12/15/2015   CREATININE 1.12 12/15/2015   BUN 11 12/15/2015   CO2 22 12/15/2015   TSH 0.939 01/07/2015   INR 1.07 12/14/2015    PT/INR:  Recent Labs  12/14/15 1919  LABPROT 14.0  INR 1.07   Radiology: Ct Chest Without Contrast  Result Date: 12/14/2015 CLINICAL DATA:  Difficulty swallowing beginning this morning. Question esophageal perforation. EXAM: CT CHEST WITHOUT CONTRAST TECHNIQUE: Multidetector CT imaging of the chest was performed following the standard protocol without IV contrast. COMPARISON:  CT chest, abdomen and pelvis and esophagram  earlier today. FINDINGS: Cardiovascular: Unremarkable. Mediastinum/Nodes: A small amount of pneumomediastinum is identified as seen on the CT earlier today. On axial images 133 and 134 contrast penetrates into the anterior wall of the esophagus but there is no extravasation of contrast into the mediastinum or pleural space. The walls of the distal esophagus are thickened. Lungs/Pleura: The lungs are emphysematous but clear. Upper Abdomen: Unremarkable. Musculoskeletal: No focal abnormality. IMPRESSION: Findings concerning for defect in the anterior distal esophagus with contrast seen within the esophageal wall. There may be a microperforation causing pneumomediastinum but no contrast extravasates into  the mediastinum or pleural space. The walls of the distal esophagus appear thickened suggestive of esophagitis. Electronically Signed   By: Drusilla Kannerhomas  Dalessio M.D.   On: 12/14/2015 20:34   Dg Chest Port 1 View  Result Date: 12/15/2015 CLINICAL DATA:  45 year old male with dyspnea and cough. Subsequent encounter. EXAM: PORTABLE CHEST 1 VIEW COMPARISON:  12/14/2015 CT and chest x-ray. FINDINGS: CT detected pneumomediastinum not appreciated by plain film exam. Lungs clear. Heart size within normal limits. IMPRESSION: CT detected pneumomediastinum not appreciated by plain film exam. Lungs clear. Electronically Signed   By: Lacy DuverneySteven  Olson M.D.   On: 12/15/2015 07:45   Dg Esophagus W/water Sol Cm  Result Date: 12/14/2015 CLINICAL DATA:  45 year old male with pneumomediastinum seen on the earlier CT. EXAM: ESOPHOGRAM/BARIUM SWALLOW TECHNIQUE: Single contrast examination was performed using  Isovue-300. FLUOROSCOPY TIME:  Fluoroscopy Time:  48 seconds Number of Acquired Spot Images: 7 COMPARISON:  CT dated 12/14/2015 FINDINGS: Evaluation is limited as the patient could not tolerate this study and could not swallow adequate amount of contrast. Scout radiograph demonstrates small amount of pneumomediastinum. The cardiac silhouette is within normal limits. The aorta is slightly tortuous. The visualized lungs are clear. Oral contrast noted throughout the esophagus entering the stomach. No extraluminal contrast or extension of contrast outside of the esophageal contour within the mediastinum identified. Gastroesophageal reflux noted during coughing. Patient subsequently vomited. IMPRESSION: No definite extraluminal contrast identified to suggest leak. Electronically Signed   By: Elgie CollardArash  Radparvar M.D.   On: 12/14/2015 19:32     Assessment/Plan: Poss micro perforation of esophagus , now 36 hours. At this point no indication for surgery- please call if can be of assistance Consider resuming clears in 2-3 days after  nausea clears Needs GI work at some point Still complains of abdominal pain    Delight Ovensdward B Gustabo Gordillo 12/15/2015 6:27 PM

## 2015-12-16 DIAGNOSIS — R112 Nausea with vomiting, unspecified: Secondary | ICD-10-CM

## 2015-12-16 DIAGNOSIS — I10 Essential (primary) hypertension: Secondary | ICD-10-CM

## 2015-12-16 DIAGNOSIS — A419 Sepsis, unspecified organism: Principal | ICD-10-CM

## 2015-12-16 LAB — GLUCOSE, CAPILLARY
GLUCOSE-CAPILLARY: 139 mg/dL — AB (ref 65–99)
GLUCOSE-CAPILLARY: 150 mg/dL — AB (ref 65–99)
Glucose-Capillary: 110 mg/dL — ABNORMAL HIGH (ref 65–99)
Glucose-Capillary: 139 mg/dL — ABNORMAL HIGH (ref 65–99)
Glucose-Capillary: 127 mg/dL — ABNORMAL HIGH (ref 65–99)
Glucose-Capillary: 120 mg/dL — ABNORMAL HIGH (ref 65–99)

## 2015-12-16 LAB — VANCOMYCIN, TROUGH
VANCOMYCIN TR: 65 ug/mL — AB (ref 15–20)
Vancomycin Tr: 18 ug/mL (ref 15–20)

## 2015-12-16 LAB — PROCALCITONIN: Procalcitonin: 0.41 ng/mL

## 2015-12-16 MED ORDER — METOCLOPRAMIDE HCL 5 MG/ML IJ SOLN
20.0000 mg | Freq: Four times a day (QID) | INTRAVENOUS | Status: DC
  Administered 2015-12-16 – 2015-12-17 (×5): 20 mg via INTRAVENOUS
  Filled 2015-12-16 (×9): qty 4

## 2015-12-16 MED ORDER — ALBUTEROL SULFATE (2.5 MG/3ML) 0.083% IN NEBU: 2.5000 mg | INHALATION_SOLUTION | RESPIRATORY_TRACT | Status: DC | PRN

## 2015-12-16 MED ORDER — SODIUM CHLORIDE 0.9 % IV SOLN
12.5000 mg | Freq: Four times a day (QID) | INTRAVENOUS | Status: DC
  Administered 2015-12-16 – 2015-12-19 (×10): 12.5 mg via INTRAVENOUS
  Filled 2015-12-16 (×16): qty 0.5

## 2015-12-16 MED ORDER — CLONIDINE HCL 0.2 MG/24HR TD PTWK
0.2000 mg | MEDICATED_PATCH | TRANSDERMAL | Status: DC
  Administered 2015-12-16: 0.2 mg via TRANSDERMAL
  Filled 2015-12-16: qty 1

## 2015-12-16 MED ORDER — PROMETHAZINE HCL 25 MG/ML IJ SOLN
12.5000 mg | Freq: Four times a day (QID) | INTRAMUSCULAR | Status: DC
  Administered 2015-12-16 – 2015-12-18 (×9): 12.5 mg via INTRAVENOUS
  Filled 2015-12-16 (×10): qty 1

## 2015-12-16 MED ORDER — ONDANSETRON HCL 4 MG/2ML IJ SOLN
4.0000 mg | Freq: Four times a day (QID) | INTRAMUSCULAR | Status: DC
  Administered 2015-12-16 – 2015-12-19 (×10): 4 mg via INTRAVENOUS
  Filled 2015-12-16 (×9): qty 2

## 2015-12-16 NOTE — Progress Notes (Signed)
CRITICAL VALUE ALERT  Critical value received: Vanc Trough 65 Date of notification: 12/16/15  Time of notification:  1613 Critical value read back:Yes  Nurse who received alert: Hazel Samshristian Kadi Hession RN  MD notified (1st page):  Notified Pharmacist Lauren Time of first page: 1613  MD notified (2nd page):  Time of second page:  Responding MD: Notified Pharmacist Lauren  Time MD responded: [phone] Another Vanc Trough will be drawn this evening. Will continue to monitor and assess.

## 2015-12-16 NOTE — Consult Note (Signed)
[facility] Consult: 12:34 PM 12/16/2015  LOS: 2 days    Referring Provider: Dr. Molli Knock  Primary Care Physician:  Myrlene Broker, MD Primary Gastroenterologist:  Gentry Fitz.  Has been seen by [facility] before 2009 and by [facility] for inpt consult 12/2014.     Reason for Consultation:  Nausea and vomiting.   HPI: Anthony Atkinson is a 45 y.o. male.  PMH Htn.  Emphysema.  On 12/2014 when he presented with nausea, vomiting and abdominal pain a CT scan suggested transverse colitis. Dr. Leone Payor felt the colon findings were incidental and inconsequential. Patient had conservative management of Boerhaave's syndrome in 04/2015. This occurred after experiencing nausea and vomiting at home.  Initial CT showed pneumo mediastinum and contrast extravasation outside of the esophagus c/w esophageal perforation. Follow-up Gastrografin, near time of discharge, showed no extravasation of Gastrografin.  Interestingly, the patient left that hospitalization AMA. Patient provides additional history that when he was in his 26s he had the first of these episodes where he had acute onset nausea and vomiting. At the time, [place]. Apparently physicians advised him that he was going to need a surgery. Wonder if this might have been endoscopy rather than a surgery. He does not recall having had esophageal tear at that time but the symptoms were very similar to what he is experienced on it 3 or 4 occasions in his lifetime.  He had an EGD maybe 10 or more years ago. Apparently this was negative for any significant findings. Patient is not on chronic, daily PPI.  Patient admitted 10/2.  He presented to the emergency room complaining of abdominal pain and violent retching. Pain predominantly in the epigastric region. He was diaphoretic.  CT  scan abdomen pelvis chest confirmed small volume pneumomediastinum, distal esophageal thickening but no extravasation of contrast. He has been treated conservatively with medical management. This includes IV fluconazole, Zosyn, vancomycin.  Protonix drip. PRN Zofran which he is receiving ~ twice a day, Phenergan prn which he is receiving ~ 3 times a day.  He had been receiving morphine but the last dose of this was last night. Patient does say the anti-emetics are effective for a short period of time. He has developed hiccups. Prior to the onset of symptoms, he had note issues with dysphagia, heartburn, nausea or vomiting. He had excellent appetite. Weight stable. He has greatly diminished his alcohol intake and the last time he had anything, was a glass of champagne on his wedding anniversary in August. Doesn't use aspirin or NSAIDs. He does smoke about 10 cigarettes daily. There is no family history of any kind of esophageal or intestinal disorders. Patient has never seen blood in his emesis.  Patient's blood pressure has been elevated and at times he has had tachycardia.  Blood pressure is being treated with clonidine patch, hydralazine, labetalol.    No fevers.    Past Medical History:  Diagnosis Date  . Hypertension     Past Surgical History:  Procedure Laterality Date  . FACIAL COSMETIC SURGERY  11/2015   cysts  removed d/t infected ingrown hairs "3 wks ago" per pt (asked on 12/15/15)    Prior to Admission medications   Medication Sig Start Date End Date Taking? Authorizing Provider  cloNIDine (CATAPRES) 0.2 MG tablet Take 1 tablet (0.2 mg total) by mouth 2 (two) times daily. 05/14/15  Yes Myrlene BrokerElizabeth A Crawford, MD  doxycycline (VIBRAMYCIN) 100 MG capsule Take 100 mg by mouth 2 (two) times daily as needed for other. For boils on face/ acne 12/07/15  Yes Historical Provider, MD  HYDROcodone-acetaminophen (NORCO/VICODIN) 5-325 MG tablet Take 1 tablet by mouth every 6 (six) hours as needed for  pain. 09/30/15  Yes Historical Provider, MD  hydrALAZINE (APRESOLINE) 25 MG tablet Take 1 tablet (25 mg total) by mouth 3 (three) times daily. Patient not taking: Reported on 12/14/2015 02/13/15   Myrlene BrokerElizabeth A Crawford, MD  lisinopril-hydrochlorothiazide (PRINZIDE,ZESTORETIC) 20-25 MG tablet Take 1 tablet by mouth daily. 05/14/15   Myrlene BrokerElizabeth A Crawford, MD    Scheduled Meds: . cloNIDine  0.2 mg Transdermal Weekly  . fluconazole (DIFLUCAN) IV  400 mg Intravenous Q24H  . piperacillin-tazobactam (ZOSYN)  IV  3.375 g Intravenous Q8H  . vancomycin  1,000 mg Intravenous Q8H   Infusions: . dextrose 5 % and 0.45 % NaCl with KCl 20 mEq/L 75 mL/hr at 12/16/15 1059  . pantoprozole (PROTONIX) infusion 8 mg/hr (12/16/15 0700)   PRN Meds: sodium chloride, albuterol, hydrALAZINE, labetalol, morphine injection, ondansetron (ZOFRAN) IV, promethazine   Allergies as of 12/14/2015  . (No Known Allergies)    No family history on file.  Social History   Social History  . Marital status: Married    Spouse name: N/A  . Number of children: N/A  . Years of education: N/A   Occupational History  . Not on file.   Social History Main Topics  . Smoking status: Current Every Day Smoker    Packs/day: 0.50    Types: Cigarettes  . Smokeless tobacco: Never Used  . Alcohol use No  . Drug use:     Types: Marijuana     Comment: 1/day per pt  . Sexual activity: Yes    Partners: Female    Birth control/ protection: None   Other Topics Concern  . Not on file   Social History Narrative  . No narrative on file    REVIEW OF SYSTEMS: Constitutional:  Patient generally does not suffer from fatigue or weakness. ENT:  No nose bleeds Pulm:  At times, with the hiccups, it is hard to take a deep breath. Does not feel dyspneic. CV:  No palpitations, no LE edema.  GU:  No hematuria, no frequency GI:  Per HPI Heme:  No unusual bleeding or bruising. No history of anemia.   Transfusions:  None Neuro:  No  headaches, no peripheral tingling or numbness Derm:  Had a facial cyst from an ingrown hair removed recently. Endocrine:  No sweats or chills.  No polyuria or dysuria Immunization:  Did not inquire as to immunization. Travel:  None beyond local counties in last few months.    PHYSICAL EXAM: Vital signs in last 24 hours: Vitals:   12/16/15 1100 12/16/15 1200  BP:  (!) 162/96  Pulse: 95 81  Resp: 15 19  Temp:  98.3 F (36.8 C)   Wt Readings from Last 3 Encounters:  12/16/15 77.6 kg (171 lb 1.2 oz)  05/14/15 83.9 kg (185 lb)  04/24/15 80.4 kg (177 lb 4 oz)    General: Pleasant, hiccuping, somewhat ill appearing AAM.  Head:  No facial asymmetry or swelling. No signs of head trauma.  Eyes:  No scleral icterus. Conjunctiva is slightly injected. No conjunctival pallor. Ears:  Hearing intact  Nose:  Vocal quality is congested but no discharge. Mouth:  Moist, clear mucous membranes. Tongue is midline. Neck:  No TMG, masses, JVD.  No crepitus Lungs:  Somewhat diminished breath sounds. No adventitious breath sounds. Some cough. It is hard for him to take a even steady, deep breath due to the hiccups. Heart: RRR. No MRG. S1, S2 present Abdomen:  Soft. Nonobese. No HSM. No bruits or hernias. Not tender.   Rectal: Deferred   Musc/Skeltl: No gross joint deformities, swelling or erythema. Extremities:  No CCE.  Neurologic:  Fully alert. Oriented 3. Moves all 4 limbs, strength not tested but no gross deficits or weakness. Skin:  No sores, rashes or significant bruising Nodes:  No cervical adenopathy.    Psych:  Cooperative, calm, pleasant.  Intake/Output from previous day: 10/03 0701 - 10/04 0700 In: 4405 [I.V.:3305; IV Piggyback:1100] Out: 1825 [Urine:1275; Emesis/NG output:550] Intake/Output this shift: Total I/O In: 524.6 [I.V.:524.6] Out: 380 [Urine:380]  LAB RESULTS:  Recent Labs  12/14/15 1053 12/14/15 1919 12/15/15 0521  WBC 28.8* 26.6* 24.7*  HGB 15.4 13.7 14.0  HCT  42.1 38.8* 39.8  PLT 376 303 321   BMET Lab Results  Component Value Date   NA 136 12/15/2015   NA 139 12/14/2015   NA 137 12/14/2015   K 3.2 (L) 12/15/2015   K 3.8 12/14/2015   K 3.6 12/14/2015   CL 103 12/15/2015   CL 103 12/14/2015   CL 98 (L) 12/14/2015   CO2 22 12/15/2015   CO2 23 12/14/2015   CO2 23 12/14/2015   GLUCOSE 121 (H) 12/15/2015   GLUCOSE 132 (H) 12/14/2015   GLUCOSE 146 (H) 12/14/2015   BUN 11 12/15/2015   BUN 9 12/14/2015   BUN 15 12/14/2015   CREATININE 1.12 12/15/2015   CREATININE 1.00 12/14/2015   CREATININE 1.16 12/14/2015   CALCIUM 9.9 12/15/2015   CALCIUM 9.6 12/14/2015   CALCIUM 11.0 (H) 12/14/2015   LFT  Recent Labs  12/14/15 1053 12/14/15 1919  PROT 10.3* 7.9  ALBUMIN 5.8* 4.3  AST 25 22  ALT 17 14*  ALKPHOS 64 55  BILITOT 1.3* 0.6   PT/INR Lab Results  Component Value Date   INR 1.07 12/14/2015   Hepatitis Panel No results for input(s): HEPBSAG, HCVAB, HEPAIGM, HEPBIGM in the last 72 hours. C-Diff No components found for: CDIFF Lipase     Component Value Date/Time   LIPASE 22 12/14/2015 1919    Drugs of Abuse     Component Value Date/Time   LABOPIA POSITIVE (A) 12/14/2015 1748   COCAINSCRNUR NONE DETECTED 12/14/2015 1748   LABBENZ NONE DETECTED 12/14/2015 1748   AMPHETMU NONE DETECTED 12/14/2015 1748   THCU POSITIVE (A) 12/14/2015 1748   LABBARB NONE DETECTED 12/14/2015 1748     RADIOLOGY STUDIES: Ct Chest Without Contrast  Result Date: 12/14/2015 CLINICAL DATA:  Difficulty swallowing beginning this morning. Question esophageal perforation. EXAM: CT CHEST WITHOUT CONTRAST TECHNIQUE: Multidetector CT imaging of the chest was performed following the standard protocol without IV contrast. COMPARISON:  CT chest, abdomen and pelvis and esophagram earlier today. FINDINGS: Cardiovascular: Unremarkable. Mediastinum/Nodes: A small amount of pneumomediastinum is identified as seen on the CT earlier today. On axial images 133  and 134 contrast penetrates into the anterior wall of the esophagus but there is no extravasation  of contrast into the mediastinum or pleural space. The walls of the distal esophagus are thickened. Lungs/Pleura: The lungs are emphysematous but clear. Upper Abdomen: Unremarkable. Musculoskeletal: No focal abnormality. IMPRESSION: Findings concerning for defect in the anterior distal esophagus with contrast seen within the esophageal wall. There may be a microperforation causing pneumomediastinum but no contrast extravasates into the mediastinum or pleural space. The walls of the distal esophagus appear thickened suggestive of esophagitis. Electronically Signed   By: Drusilla Kanner M.D.   On: 12/14/2015 20:34   Ct Chest W Contrast  Result Date: 12/14/2015 CLINICAL DATA:  Abdominal pain with nausea and vomiting. History of Boerhaave syndrome. EXAM: CT CHEST, ABDOMEN, AND PELVIS WITH CONTRAST TECHNIQUE: Multidetector CT imaging of the chest, abdomen and pelvis was performed following the standard protocol during bolus administration of intravenous contrast. CONTRAST:  ISOVUE-300 IOPAMIDOL (ISOVUE-300) INJECTION 61% COMPARISON:  CT abdomen pelvis 04/23/2015. FINDINGS: CT CHEST FINDINGS Cardiovascular: The heart size is normal. There is no pericardial effusion. There is a normal 3-vessel aortic branching pattern. There is no aortic atherosclerosis. Mediastinum/Nodes: There is pneumomediastinum with air anterior to the esophagus tracking superiorly posterior to the pulmonary artery. Air is also seen anterior to the aortic outflow tract. Thyroid is unremarkable. No mediastinal, hilar or axillary lymphadenopathy. Lungs/Pleura: Multiple posterior biapical cystic spaces. There are multiple nodular subpleural opacities along the left diaphragmatic dome. Lungs are otherwise normal. No pleural effusion. 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 This is unchanged from the prior study of 04/23/2015 No biliary dilatation. The gallbladder is normal. Pancreas: Pancreatic contours are normal. No abnormal calcifications or mass lesions. No peripancreatic fluid collection. No pancreatic ductal dilatation. Spleen: Normal Adrenals/Urinary Tract: Adrenal glands are normal. No hydronephrosis or solid renal mass. Stomach/Bowel: No dilated small bowel or other evidence of obstruction. No enteric or colonic inflammation. The appendix is normal. Vascular/Lymphatic: No abdominal aortic aneurysm. The inferior vena cava, portal vein, splenic vein and superior mesenteric vein are patent. The aorta, bilateral renal arteries, celiac axis and superior mesenteric artery are patent. The visualized iliac vessels are normal. Mild aortic atherosclerotic calcification. No pelvic or abdominal lymphadenopathy. Reproductive: Prostate is enlarged, measuring 5 cm in transverse dimension. Other: None Musculoskeletal: No bony spinal canal stenosis. No lytic or blastic lesions. IMPRESSION: 1. Small volume pneumomediastinum, with air tracking anterior to the proximal ascending aorta, posterior to the pulmonary arteries and medially along the right mainstem bronchus. The findings likely indicate esophageal rupture. 2. Mild aortic atherosclerosis. 3. Unchanged subpleural nodular opacities of the left of my down. These results will be called to the ordering clinician or representative by the Radiologist Assistant, and communication documented in the PACS or zVision Dashboard. Electronically Signed   By: Deatra Robinson M.D.   On: 12/14/2015 14:57   Ct Abdomen Pelvis W Contrast  Result Date: 12/14/2015 CLINICAL DATA:  Abdominal pain with nausea and vomiting. History of Boerhaave syndrome. EXAM: CT CHEST, ABDOMEN, AND PELVIS WITH CONTRAST TECHNIQUE: Multidetector CT imaging of the chest, abdomen and pelvis was performed  following the standard protocol during bolus administration of intravenous contrast. CONTRAST:  ISOVUE-300 IOPAMIDOL (ISOVUE-300) INJECTION 61% COMPARISON:  CT abdomen pelvis 04/23/2015. FINDINGS: CT CHEST FINDINGS Cardiovascular: The heart size is normal. There is no pericardial effusion. There is a normal 3-vessel aortic branching pattern. There is no aortic atherosclerosis. Mediastinum/Nodes: There is pneumomediastinum with air anterior to  the esophagus tracking superiorly posterior to the pulmonary artery. Air is also seen anterior to the aortic outflow tract. Thyroid is unremarkable. No mediastinal, hilar or axillary lymphadenopathy. Lungs/Pleura: Multiple posterior biapical cystic spaces. There are multiple nodular subpleural opacities along the left diaphragmatic dome. Lungs are otherwise normal. No pleural effusion. 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 This is unchanged from the prior study of 04/23/2015 No biliary dilatation. The gallbladder is normal. Pancreas: Pancreatic contours are normal. No abnormal calcifications or mass lesions. No peripancreatic fluid collection. No pancreatic ductal dilatation. Spleen: Normal Adrenals/Urinary Tract: Adrenal glands are normal. No hydronephrosis or solid renal mass. Stomach/Bowel: No dilated small bowel or other evidence of obstruction. No enteric or colonic inflammation. The appendix is normal. Vascular/Lymphatic: No abdominal aortic aneurysm. The inferior vena cava, portal vein, splenic vein and superior mesenteric vein are patent. The aorta, bilateral renal arteries, celiac axis and superior mesenteric artery are patent. The visualized iliac vessels are normal. Mild aortic atherosclerotic calcification. No pelvic or abdominal lymphadenopathy. Reproductive: Prostate is enlarged, measuring 5 cm in transverse  dimension. Other: None Musculoskeletal: No bony spinal canal stenosis. No lytic or blastic lesions. IMPRESSION: 1. Small volume pneumomediastinum, with air tracking anterior to the proximal ascending aorta, posterior to the pulmonary arteries and medially along the right mainstem bronchus. The findings likely indicate esophageal rupture. 2. Mild aortic atherosclerosis. 3. Unchanged subpleural nodular opacities of the left of my down. These results will be called to the ordering clinician or representative by the Radiologist Assistant, and communication documented in the PACS or zVision Dashboard. Electronically Signed   By: Deatra Robinson M.D.   On: 12/14/2015 14:57   Dg Chest Port 1 View  Result Date: 12/15/2015 CLINICAL DATA:  45 year old male with dyspnea and cough. Subsequent encounter. EXAM: PORTABLE CHEST 1 VIEW COMPARISON:  12/14/2015 CT and chest x-ray. FINDINGS: CT detected pneumomediastinum not appreciated by plain film exam. Lungs clear. Heart size within normal limits. IMPRESSION: CT detected pneumomediastinum not appreciated by plain film exam. Lungs clear. Electronically Signed   By: Lacy Duverney M.D.   On: 12/15/2015 07:45   Dg Abdomen Acute W/chest  Result Date: 12/14/2015 CLINICAL DATA:  Abdominal pain. EXAM: DG ABDOMEN ACUTE W/ 1V CHEST COMPARISON:  04/24/2015.  CT 04/23/2015. FINDINGS: Soft tissue structures are unremarkable. No acute cardiopulmonary disease. No acute intra-abdominal disease. No bowel distention. Prominent amount of stool noted throughout the colon. No free air. Pelvic calcifications consistent phleboliths. Leads noted over the abdomen in chest. IMPRESSION: Number No acute cardiopulmonary disease. 2. No acute intra-abdominal abnormality. Prominent amount of stool noted throughout colon. Constipation cannot be excluded. Electronically Signed   By: Maisie Fus  Register   On: 12/14/2015 12:45   Dg Esophagus W/water Marchelle Folks Cm  Result Date: 12/14/2015 CLINICAL DATA:  45 year old  male with pneumomediastinum seen on the earlier CT. EXAM: ESOPHOGRAM/BARIUM SWALLOW TECHNIQUE: Single contrast examination was performed using  Isovue-300. FLUOROSCOPY TIME:  Fluoroscopy Time:  48 seconds Number of Acquired Spot Images: 7 COMPARISON:  CT dated 12/14/2015 FINDINGS: Evaluation is limited as the patient could not tolerate this study and could not swallow adequate amount of contrast. Scout radiograph demonstrates small amount of pneumomediastinum. The cardiac silhouette is within normal limits. The aorta is slightly tortuous. The visualized lungs are clear. Oral contrast noted throughout the esophagus entering the stomach. No extraluminal contrast or extension of contrast outside of the  esophageal contour within the mediastinum identified. Gastroesophageal reflux noted during coughing. Patient subsequently vomited. IMPRESSION: No definite extraluminal contrast identified to suggest leak. Electronically Signed   By: Elgie Collard M.D.   On: 12/14/2015 19:32     IMPRESSION:   *  Recurrent Boorhave syndrome. Patient has isolated episodes of acute nausea and vomiting. Remote upper endoscopy with unknown findings. Not on regular PPI but no chronic GERD symptoms.   PLAN:     *  Added scheduled Zofran, Phenergan instead of just when necessary. Also will add scheduled Reglan. Try Thorazine when necessary for the hiccups.  *  Will eventually need an EGD in the coming weeks.   Jennye Moccasin  12/16/2015, 12:34 PM Pager: [phone]

## 2015-12-16 NOTE — Progress Notes (Addendum)
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  Found to have pneumomediastinum on CT chest and small distal esophageal tear.  Admitted to ICU by PCCM.  Followed by CVTS who recommend conservative management.    SUBJECTIVE:  2 episodes emesis overnight.   VITAL SIGNS: BP (!) 166/101 (BP Location: Right Arm)   Pulse 98   Temp 98.1 F (36.7 C) (Oral)   Resp (!) 23   Ht 6\' 1"  (1.854 m)   Wt 77.6 kg (171 lb 1.2 oz)   SpO2 95%   BMI 22.57 kg/m   INTAKE / OUTPUT: I/O last 3 completed shifts: In: 6919.4 [I.V.:5319.4; IV Piggyback:1600] Out: 3270 [Urine:2720; Emesis/NG output:550]   PHYSICAL EXAMINATION: General: Well appearing, NAD Neuro: Alert and interactive, moving all ext to command. CV: RRR, Nl S1/S2, -M/R/G. PULM: CTA bilaterally. GI: Soft, NT, ND and +BS. Extremities: -edema and -tenderness.  LABS:  BMET  Recent Labs Lab 12/14/15 1053 12/14/15 1919 12/15/15 0521  NA 137 139 136  K 3.6 3.8 3.2*  CL 98* 103 103  CO2 23 23 22   BUN 15 9 11   CREATININE 1.16 1.00 1.12  GLUCOSE 146* 132* 121*    Electrolytes  Recent Labs Lab 12/14/15 1053 12/14/15 1919 12/15/15 0521  CALCIUM 11.0* 9.6 9.9  MG  --  1.7  --   PHOS  --  2.8  --     CBC  Recent Labs Lab 12/14/15 1053 12/14/15 1919 12/15/15 0521  WBC 28.8* 26.6* 24.7*  HGB 15.4 13.7 14.0  HCT 42.1 38.8* 39.8  PLT 376 303 321    Coag's  Recent Labs Lab 12/14/15 1919  APTT 31  INR 1.07    Sepsis Markers  Recent Labs Lab 12/14/15 1746 12/14/15 1919 12/14/15 2014 12/15/15 0521 12/15/15 1001 12/16/15 0326  LATICACIDVEN 3.5*  --  5.0*  --  1.9  --   PROCALCITON  --  0.29  --  0.38  --  0.41    ABG No results  for input(s): PHART, PCO2ART, PO2ART in the last 168 hours.  Liver Enzymes  Recent Labs Lab 12/14/15 1053 12/14/15 1919  AST 25 22  ALT 17 14*  ALKPHOS 64 55  BILITOT 1.3* 0.6  ALBUMIN 5.8* 4.3    Cardiac Enzymes  Recent Labs Lab 12/14/15 1919 12/14/15 2337 12/15/15 0521  TROPONINI 0.03* 0.03* 0.03*    Glucose  Recent Labs Lab 12/15/15 1149 12/15/15 1551 12/15/15 1922 12/15/15 2357 12/16/15 0352 12/16/15 0748  GLUCAP 134* 110* 151* 127* 139* 127*    Imaging No results found.   STUDIES:  CT cest / abdomen / pelvis 10/2 >  volume pneumomediastinum with findings suspicious for esophageal rupture. Gastrografin Esophagogram 10/2 > defect in ant distal esophagus. No extravasation of contrast. Esophagitis.   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 10/2 > re-admitted with same.  LINES/TUBES: None.  DISCUSSION: 45 y.o. 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 He presents in a similar fashion.  Has epigastric pain, diaphoresis, ct scan with  pneumomediastinum.  This is the 4th time this happened.  Pt left AMA last 04/2015.  He has not followed up with GI as outpt.   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 4th time this happened.  Poor follow up.  Pneumomediastinum on chest ct scan. Gastrografin 10/2 showed possible distal esophageal tear/esophagitis and NO extravasation of contrast.  GI prophylaxis. Nutrition. Ongoing nausea/vomiting  P:   Conservative management  CVTS signed off  Keep NPO for now  IV abx.  protonix gtt  GI to see   CARDIOVASCULAR A:  Hx uncontrolled HTN  P:  Labetalol PRN, Hydralazine PRN. >> will  increase hydralazine dose from 10 to 20 mg prn.  Trend lactate, troponin. Place clonidine patch 10/4 - NPO, clonidine home med  Hold preadmission clonidine, hydralazine, lisinopril-hctz.  PULMONARY A: Questionable COPD given hx tobacco use, blebs on CT chest  P:   Keep O2 sats > 88%. Smoking cessation done.  PRN BD   RENAL A:   AGMA - lactate - resolved.  P:   D5 1/2 NS @ 100 mls/hr. BMP in AM.  HEMATOLOGIC A:   VTE Prophylaxis. P:  SCD's. CBC in AM.  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 Mediastinitis/Esophagitis P:   Abx as above (Vanc / Zosyn/Fluconazole).  Follow cultures as above. PCT algorithm to limit abx exposure.   ENDOCRINE A:   No acute issues.   P:   Monitor glucose on chem  D/c SSI   NEUROLOGIC A:   Pain. Substance abuse - UDS (+) for THC P:   PRN morphine     Family updated: Pt updated at length at bedside 10/4  Interdisciplinary Family Meeting v Palliative Care Meeting:  Due by: 10/9    Will tx to SDU but continue to monitor closely with ongoing n/v.  Will ask GI to eval.    [name], NP 12/16/2015  10:54 AM Pager: [phone] or [phone]  Attending Note:  45 year old male with previous history of borhave's syndrome presenting with N/V and was found to have fever and mediastinal air.  CT of the chest that I reviewed myself with pneumomediastinum.  On exam, no CP to palpation and lungs are clear.  Discussed with TRH-MD and PCCM-NP.  N/V:  - GI consult called.  - Zofran.  Pneumomediastinum: no evidence of mediastinitis.  - Abx.  - NPO  Sepsis:  - Continue vanc/zosyn/diflucan.  - F/U on cultures.  HTN:  - Clonidine patch.  Transfer to [facility] and to SDU with PCCM off 10/5.  Patient seen and examined, agree with above note.  I dictated the care and orders written for this patient under my direction.  [name],  MD [phone]

## 2015-12-17 ENCOUNTER — Inpatient Hospital Stay (HOSPITAL_COMMUNITY): Payer: 59

## 2015-12-17 DIAGNOSIS — I5032 Chronic diastolic (congestive) heart failure: Secondary | ICD-10-CM

## 2015-12-17 DIAGNOSIS — N179 Acute kidney failure, unspecified: Secondary | ICD-10-CM

## 2015-12-17 DIAGNOSIS — I10 Essential (primary) hypertension: Secondary | ICD-10-CM

## 2015-12-17 DIAGNOSIS — F1910 Other psychoactive substance abuse, uncomplicated: Secondary | ICD-10-CM

## 2015-12-17 DIAGNOSIS — J431 Panlobular emphysema: Secondary | ICD-10-CM

## 2015-12-17 LAB — BASIC METABOLIC PANEL
ANION GAP: 13 (ref 5–15)
BUN: 10 mg/dL (ref 6–20)
CALCIUM: 9 mg/dL (ref 8.9–10.3)
CHLORIDE: 101 mmol/L (ref 101–111)
CO2: 24 mmol/L (ref 22–32)
Creatinine, Ser: 1.51 mg/dL — ABNORMAL HIGH (ref 0.61–1.24)
GFR calc Af Amer: >60 mL/min (ref >60)
GFR calc non Af Amer: 54 mL/min — ABNORMAL LOW (ref >60)
GLUCOSE: 102 mg/dL — AB (ref 65–99)
POTASSIUM: 3.2 mmol/L — AB (ref 3.5–5.1)
Sodium: 138 mmol/L (ref 135–145)

## 2015-12-17 LAB — VANCOMYCIN, TROUGH: Vancomycin Tr: 23 ug/mL (ref 15–20)

## 2015-12-17 MED ORDER — IOPAMIDOL (ISOVUE-300) INJECTION 61%: 150.0000 mL | Freq: Once | INTRAVENOUS | Status: DC | PRN

## 2015-12-17 MED ORDER — PANTOPRAZOLE SODIUM 40 MG PO TBEC: 40.0000 mg | DELAYED_RELEASE_TABLET | Freq: Two times a day (BID) | ORAL | Status: DC

## 2015-12-17 MED ORDER — POTASSIUM CHLORIDE 20 MEQ/15ML (10%) PO SOLN
40.0000 meq | Freq: Every day | ORAL | Status: DC
  Administered 2015-12-17 – 2015-12-18 (×2): 40 meq via ORAL
  Filled 2015-12-17 (×2): qty 30

## 2015-12-17 MED ORDER — HYDRALAZINE HCL 20 MG/ML IJ SOLN
10.0000 mg | INTRAMUSCULAR | Status: DC
  Administered 2015-12-17 – 2015-12-18 (×6): 10 mg via INTRAVENOUS
  Filled 2015-12-17 (×8): qty 1

## 2015-12-17 MED ORDER — METOPROLOL TARTRATE 5 MG/5ML IV SOLN
2.5000 mg | Freq: Four times a day (QID) | INTRAVENOUS | Status: DC
  Administered 2015-12-17 – 2015-12-18 (×4): 2.5 mg via INTRAVENOUS
  Filled 2015-12-17 (×5): qty 5

## 2015-12-17 MED ORDER — IOPAMIDOL (ISOVUE-300) INJECTION 61%
INTRAVENOUS | Status: AC
  Filled 2015-12-17: qty 150

## 2015-12-17 MED ORDER — PANTOPRAZOLE SODIUM 40 MG PO TBEC
40.0000 mg | DELAYED_RELEASE_TABLET | Freq: Two times a day (BID) | ORAL | Status: DC
  Administered 2015-12-17 – 2015-12-18 (×3): 40 mg via ORAL
  Filled 2015-12-17 (×3): qty 1

## 2015-12-17 MED ORDER — VANCOMYCIN HCL IN DEXTROSE 750-5 MG/150ML-% IV SOLN
750.0000 mg | Freq: Three times a day (TID) | INTRAVENOUS | Status: DC
  Administered 2015-12-17 – 2015-12-18 (×5): 750 mg via INTRAVENOUS
  Filled 2015-12-17 (×10): qty 150

## 2015-12-17 NOTE — Progress Notes (Signed)
Daily Rounding Note  12/17/2015, 9:55 AM  LOS: 3 days   SUBJECTIVE:   Chief complaint: n/v and hiccups   With meds adjusted, no further n/v and thorazine controlling hiccups.  Not particularly drowsy  OBJECTIVE:         Vital signs in last 24 hours:    Temp:  [98.2 F (36.8 C)-99.7 F (37.6 C)] 98.2 F (36.8 C) (10/05 0834) Pulse Rate:  [76-114] 89 (10/05 0900) Resp:  [13-31] 25 (10/05 0900) BP: (140-185)/(80-117) 150/93 (10/05 0800) SpO2:  [89 %-99 %] 92 % (10/05 0900) Weight:  [76.4 kg (168 lb 6.9 oz)] 76.4 kg (168 lb 6.9 oz) (10/05 0500)   Filed Weights   12/15/15 0500 12/16/15 0500 12/17/15 0500  Weight: 81.4 kg (179 lb 7.3 oz) 77.6 kg (171 lb 1.2 oz) 76.4 kg (168 lb 6.9 oz)   General: pleasant, looks better   Heart: RRR Chest: clear bil.  No cough or dyspnea Abdomen: soft, NT, ND  Extremities: no CCE Neuro/Psych:  Oriented x 3, no gross weakness/tremors/deficits.   Intake/Output from previous day: 10/04 0701 - 10/05 0700 In: 3274.1 [I.V.:2274.6; IV Piggyback:999.5] Out: 1430 [Urine:1230; Emesis/NG output:200]  Intake/Output this shift: Total I/O In: 274 [I.V.:220; IV Piggyback:54] Out: 250 [Urine:250]  Lab Results:  Recent Labs  12/14/15 1053 12/14/15 1919 12/15/15 0521  WBC 28.8* 26.6* 24.7*  HGB 15.4 13.7 14.0  HCT 42.1 38.8* 39.8  PLT 376 303 321   BMET  Recent Labs  12/14/15 1919 12/15/15 0521 12/17/15 0537  NA 139 136 138  K 3.8 3.2* 3.2*  CL 103 103 101  CO2 23 22 24   GLUCOSE 132* 121* 102*  BUN 9 11 10   CREATININE 1.00 1.12 1.51*  CALCIUM 9.6 9.9 9.0   LFT  Recent Labs  12/14/15 1053 12/14/15 1919  PROT 10.3* 7.9  ALBUMIN 5.8* 4.3  AST 25 22  ALT 17 14*  ALKPHOS 64 55  BILITOT 1.3* 0.6   PT/INR  Recent Labs  12/14/15 1919  LABPROT 14.0  INR 1.07   Hepatitis Panel No results for input(s): HEPBSAG, HCVAB, HEPAIGM, HEPBIGM in the last 72  hours.  Studies/Results: Dg Esophagus W/water Sol Cm  Result Date: 12/17/2015 CLINICAL DATA:  Pneumomediastinum.  Evaluate for esophageal leak. EXAM: ESOPHOGRAM/BARIUM SWALLOW TECHNIQUE: Single contrast examination was performed using water-soluble contrast followed by thin barium. FLUOROSCOPY TIME:  Fluoroscopy Time:  2 minutes 44 seconds Radiation Exposure Index (if provided by the fluoroscopic device): Number of Acquired Spot Images: 0 COMPARISON:  None. FINDINGS: The study was performed in the upright position initially using Isovue 300. This demonstrated no evidence of focal esophageal abnormality, esophageal leak or fold thickening. Thin barium was then used, again demonstrating no evidence of esophageal leak or focal esophageal abnormality. IMPRESSION: No evidence of esophageal leak or other esophageal abnormality. Electronically Signed   By: Charlett Nose M.D.   On: 12/17/2015 09:46     Scheduled Meds: . chlorproMAZINE (THORAZINE) IV  12.5 mg Intravenous Q6H  . cloNIDine  0.2 mg Transdermal Weekly  . fluconazole (DIFLUCAN) IV  400 mg Intravenous Q24H  . iopamidol      . metoCLOPramide (REGLAN) injection  20 mg Intravenous Q6H  . ondansetron (ZOFRAN) IV  4 mg Intravenous Q6H  . piperacillin-tazobactam (ZOSYN)  IV  3.375 g Intravenous Q8H  . promethazine  12.5 mg Intravenous Q6H  . vancomycin  750 mg Intravenous Q8H   Continuous Infusions: . dextrose  5 % and 0.45 % NaCl with KCl 20 mEq/L 75 mL/hr at 12/17/15 0900  . pantoprozole (PROTONIX) infusion 8 mg/hr (12/17/15 0900)   PRN Meds:.sodium chloride, albuterol, hydrALAZINE, iopamidol, labetalol, morphine injection, ondansetron (ZOFRAN) IV, promethazine   ASSESMENT:   *  Recurrent Boorhave syndrome. On going n/v and hiccups.  Schedule anti-emetics and pro-kinetic agents initiated 10/4 and sxs much improved.  Repeat esophagram again confirms no leak.    *  Hypokalemia.    *  Renal insufficiency.     PLAN   *  Allow full liquid  diet and advance to soft diet as tolerated.  *  Leave current meds (though I did switch to po BID Protonix after current bag finishes) in place and if tolerates po for next 24 hours:begin to taper the meds.     Anthony MoccasinSarah Alim Atkinson  12/17/2015, 9:55 AM Pager: [phone]

## 2015-12-17 NOTE — Progress Notes (Addendum)
Pharmacy Antibiotic Note  Anthony Atkinson is a 45 y.o. male admitted on 12/14/2015 with presumed Boerhaave's syndrome.  Pharmacy has been consulted for vancomycin, Zosyn, and fluconazole dosing.  Vanc trough this pm was within goal range but vanc dose had been hung and infused in its entirety prior to lab draw, so this was more of a peak, expect trough to be quite low.  Dosing remains appropriate for Zosyn and fluconazole.  Plan: This patient's current antibiotics will be continued without adjustments.  Vanc trough prior to next dose, spoke w/ RN who will ensure labs drawn prior to dose being hung.  Height: 6\' 1"  (185.4 cm) Weight: 171 lb 1.2 oz (77.6 kg) IBW/kg (Calculated) : 79.9  Temp (24hrs), Avg:98.7 F (37.1 C), Min:98.1 F (36.7 C), Max:99.7 F (37.6 C)   Recent Labs Lab 12/14/15 1053 12/14/15 1217 12/14/15 1548 12/14/15 1746 12/14/15 1919 12/14/15 2014 12/15/15 0521 12/15/15 1001 12/16/15 1452 12/16/15 2235  WBC 28.8*  --   --   --  26.6*  --  24.7*  --   --   --   CREATININE 1.16  --   --   --  1.00  --  1.12  --   --   --   LATICACIDVEN  --  3.88* 2.80* 3.5*  --  5.0*  --  1.9  --   --   VANCOTROUGH  --   --   --   --   --   --   --   --  65* 18    Estimated Creatinine Clearance: 90.5 mL/min (by C-G formula based on SCr of 1.12 mg/dL).    No Known Allergies    Thank you for allowing pharmacy to be a part of this patient's care.  Vernard GamblesVeronda Nahuel Wilbert, PharmD, BCPS  12/17/2015 12:03 AM   ADDENDUM: Trough this am elevated at 23; will change vanc to 750mg  IV Q8H and continue to monitor. VB 12/17/2015 7:59 AM

## 2015-12-17 NOTE — Progress Notes (Signed)
PROGRESS NOTE    Anthony Atkinson  ZOX:[id number] DOB: [birth year] DOA: 12/14/2015 PCP: Myrlene Broker, MD   Brief Narrative:  45 y.o. BM PMHx of HTN, Chronic Diastolic CHF.   He apparently woke up at 1AM on 12/14/15 complaining of abd pain.  Shortly thereafter, wife reports that pt began retching violently and complained of worsening pain in his epigastric region.  He did not have chest pain or SOB. On arrival to ED, pt was pale and diaphoretic and appeared very uncomfortable.  He apparently had already had 3 change of clothes due to him saturating each pair with sweat.  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  Pt left AMA last 04/2015.  He has not followed up with GI as outpt.   He had CT of the chest / abdomen / pelvis that demonstrated small volume pneumomediastinum with findings suspicious for esophageal rupture.  He was transferred to [facility] and PCCM was asked to see in consultation.   Subjective: 10/5  A/O 4, NAD, last 24 hours afebrile., states has been able to keep liquids down without N/V. Negative hiccups.   Assessment & Plan:   Active Problems:   Pneumomediastinum (HCC)   Chronic diastolic CHF (congestive heart failure) (HCC)   Essential hypertension   Panlobular emphysema (HCC)   Acute renal failure (HCC)   Substance abuse   Boerhaave's Syndrome/Pneumomediastinum  -Per GI Allow full liquid diet and advance to soft diet as tolerated.  - Leave current meds, except for switch to PO BID Protonix - 4th time this happened.   patient made aware of the seriousness of this diagnosis. -Continue current antibiotics will need to talk with GI/ID how long to continue antibiotic? When patient ready for discharge what antibiotic to discharge on?  Chronic Diastolic CHF -Strict in and out since admission +9.7 L -Daily  weight -Prior to admission patient not on optimal CHF medication. -Clonidine 0.2 mg patch -Start metoprolol IV 2.5 mg QID -Hydralazine IV 10 mg QID  HTN uncontrolled   -See chronic diastolic CHF  COPD -given hx tobacco use, blebs on CT chest  -Keep O2 sats > 88%. -Smoking cessation done.   Acute renal failure (Cr admission 1.12) Lab Results  Component Value Date   CREATININE 1.51 (H) 12/17/2015   CREATININE 1.12 12/15/2015   CREATININE 1.00 12/14/2015     Substance abuse  - UDS (+) for THC. Spoke at length with patient concerning absolute need to discontinue smoking/substance abuse   Hypokalemia -Potassium goal> 4 -Potassium PO 40 mEq   DVT prophylaxis: SCD Code Status: Full Family Communication: None Disposition Plan: Per GI   Consultants:  Dr.Kavitha V Nandigam GI Dr [name]. 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 10/2 > re-admitted with same CT cest / abdomen / pelvis 10/2 >  volume pneumomediastinum with findings suspicious for esophageal rupture. Gastrografin Esophagogram 10/2 > defect in ant distal esophagus. No extravasation of contrast. Esophagitis.      Cultures 10/2 urine negative 10/2 blood right AC/hand/left arm/left hand NGTD  10/2 MRSA by PCR negative    Antimicrobials: Diflucan 10/2 > Zosyn 10/2 > Vanc 10/2 >      Devices None   LINES / TUBES:  None    Continuous Infusions: . dextrose 5 % and 0.45 %  NaCl with KCl 20 mEq/L 10 mL/hr at 12/17/15 1800     Objective: Vitals:   12/17/15 1400 12/17/15 1600 12/17/15 1633 12/17/15 1745  BP: 134/72 (!) 151/125  120/90  Pulse:      Resp: (!) 21 (!) 25  (!) 25  Temp:   97.8 F (36.6 C)   TempSrc:   Oral   SpO2:      Weight:      Height:        Intake/Output Summary (Last 24 hours) at 12/17/15 1903 Last data filed at 12/17/15 1800  Gross per 24 hour  Intake          3066.75 ml   Output             1250 ml  Net          1816.75 ml   Filed Weights   12/15/15 0500 12/16/15 0500 12/17/15 0500  Weight: 81.4 kg (179 lb 7.3 oz) 77.6 kg (171 lb 1.2 oz) 76.4 kg (168 lb 6.9 oz)    Examination:  General: A/O 4, NAD, No acute respiratory distress Eyes: negative scleral hemorrhage, negative anisocoria, negative icterus ENT: Negative Runny nose, negative gingival bleeding, Neck:  Negative scars, masses, torticollis, lymphadenopathy, JVD Lungs: Clear to auscultation bilaterally without wheezes or crackles Cardiovascular: Regular rate and rhythm without murmur gallop or rub normal S1 and S2 Abdomen: negative abdominal pain, nondistended, positive soft, bowel sounds, no rebound, no ascites, no appreciable mass Extremities: No significant cyanosis, clubbing, or edema bilateral lower extremities Skin: Negative rashes, lesions, ulcers Psychiatric:  Negative depression, negative anxiety, negative fatigue, negative mania  Central nervous system:  Cranial nerves II through XII intact, tongue/uvula midline, all extremities muscle strength 5/5, sensation intact throughout, negative dysarthria, negative expressive aphasia, negative receptive aphasia.  .     Data Reviewed: Care during the described time interval was provided by me .  I have reviewed this patient's available data, including medical history, events of note, physical examination, and all test results as part of my evaluation. I have personally reviewed and interpreted all radiology studies.  CBC:  Recent Labs Lab 12/14/15 1053 12/14/15 1919 12/15/15 0521  WBC 28.8* 26.6* 24.7*  NEUTROABS  --  25.8*  --   HGB 15.4 13.7 14.0  HCT 42.1 38.8* 39.8  MCV 94.0 96.3 95.9  PLT 376 303 321   Basic Metabolic Panel:  Recent Labs Lab 12/14/15 1053 12/14/15 1919 12/15/15 0521 12/17/15 0537  NA 137 139 136 138  K 3.6 3.8 3.2* 3.2*  CL 98* 103 103 101  CO2 23 23 22 24   GLUCOSE 146* 132* 121* 102*  BUN 15 9 11 10    CREATININE 1.16 1.00 1.12 1.51*  CALCIUM 11.0* 9.6 9.9 9.0  MG  --  1.7  --   --   PHOS  --  2.8  --   --    GFR: Estimated Creatinine Clearance: 66.1 mL/min (by C-G formula based on SCr of 1.51 mg/dL (H)). Liver Function Tests:  Recent Labs Lab 12/14/15 1053 12/14/15 1919  AST 25 22  ALT 17 14*  ALKPHOS 64 55  BILITOT 1.3* 0.6  PROT 10.3* 7.9  ALBUMIN 5.8* 4.3    Recent Labs Lab 12/14/15 1053 12/14/15 1919  LIPASE 19 22  AMYLASE  --  39   No results for input(s): AMMONIA in the last 168 hours. Coagulation Profile:  Recent Labs Lab 12/14/15 1919  INR 1.07   Cardiac Enzymes:  Recent Labs Lab  12/14/15 1456 12/14/15 1919 12/14/15 2337 12/15/15 0521  TROPONINI <0.03 0.03* 0.03* 0.03*   BNP (last 3 results) No results for input(s): PROBNP in the last 8760 hours. HbA1C: No results for input(s): HGBA1C in the last 72 hours. CBG:  Recent Labs Lab 12/16/15 0352 12/16/15 0748 12/16/15 1158 12/16/15 1540 12/16/15 2029  GLUCAP 139* 127* 120* 139* 150*   Lipid Profile: No results for input(s): CHOL, HDL, LDLCALC, TRIG, CHOLHDL, LDLDIRECT in the last 72 hours. Thyroid Function Tests: No results for input(s): TSH, T4TOTAL, FREET4, T3FREE, THYROIDAB in the last 72 hours. Anemia Panel: No results for input(s): VITAMINB12, FOLATE, FERRITIN, TIBC, IRON, RETICCTPCT in the last 72 hours. Urine analysis:    Component Value Date/Time   COLORURINE STRAW (A) 12/14/2015 1746   APPEARANCEUR CLEAR 12/14/2015 1746   LABSPEC 1.023 12/14/2015 1746   PHURINE 8.0 12/14/2015 1746   GLUCOSEU 250 (A) 12/14/2015 1746   HGBUR TRACE (A) 12/14/2015 1746   BILIRUBINUR NEGATIVE 12/14/2015 1746   KETONESUR NEGATIVE 12/14/2015 1746   PROTEINUR NEGATIVE 12/14/2015 1746   UROBILINOGEN 0.2 01/06/2015 0328   NITRITE NEGATIVE 12/14/2015 1746   LEUKOCYTESUR NEGATIVE 12/14/2015 1746   Sepsis Labs: @LABRCNTIP (procalcitonin:4,lacticidven:4)  ) Recent Results (from the past 240  hour(s))  Urine culture     Status: None   Collection Time: 12/14/15 10:49 AM  Result Value Ref Range Status   Specimen Description URINE, RANDOM  Final   Special Requests NONE  Final   Culture NO GROWTH [facility]   Final   Report Status 12/15/2015 FINAL  Final  Blood culture (routine x 2)     Status: None (Preliminary result)   Collection Time: 12/14/15 12:08 PM  Result Value Ref Range Status   Specimen Description BLOOD RIGHT ANTECUBITAL  Final   Special Requests IN PEDIATRIC BOTTLE 4 ML  Final   Culture   Final    NO GROWTH 3 DAYS [facility]    Report Status PENDING  Incomplete  Blood culture (routine x 2)     Status: None (Preliminary result)   Collection Time: 12/14/15  1:14 PM  Result Value Ref Range Status   Specimen Description BLOOD RIGHT HAND  Final   Special Requests BOTTLES DRAWN AEROBIC ONLY 1 CC  Final   Culture   Final    NO GROWTH 3 DAYS [facility]     Report Status PENDING  Incomplete  Culture, blood (routine x 2)     Status: None (Preliminary result)   Collection Time: 12/14/15  6:35 PM  Result Value Ref Range Status   Specimen Description BLOOD LEFT ARM  Final   Special Requests IN PEDIATRIC BOTTLE 2 CC  Final   Culture NO GROWTH 3 DAYS  Final   Report Status PENDING  Incomplete  Culture, blood (routine x 2)     Status: None (Preliminary result)   Collection Time: 12/14/15  6:42 PM  Result Value Ref Range Status   Specimen Description BLOOD LEFT HAND  Final   Special Requests IN PEDIATRIC BOTTLE 2 CC  Final   Culture NO GROWTH 3 DAYS  Final   Report Status PENDING  Incomplete  MRSA PCR Screening     Status: None   Collection Time: 12/14/15 11:45 PM  Result Value Ref Range Status   MRSA by PCR NEGATIVE NEGATIVE Final    Comment:        The GeneXpert MRSA Assay (FDA approved for NASAL specimens only),  is one component of a comprehensive MRSA colonization surveillance program. It is  not intended to diagnose MRSA infection nor to guide or monitor treatment for MRSA infections.          Radiology Studies: Dg Esophagus W/water Sol Cm  Result Date: 12/17/2015 CLINICAL DATA:  Pneumomediastinum.  Evaluate for esophageal leak. EXAM: ESOPHOGRAM/BARIUM SWALLOW TECHNIQUE: Single contrast examination was performed using water-soluble contrast followed by thin barium. FLUOROSCOPY TIME:  Fluoroscopy Time:  2 minutes 44 seconds Radiation Exposure Index (if provided by the fluoroscopic device): Number of Acquired Spot Images: 0 COMPARISON:  None. FINDINGS: The study was performed in the upright position initially using Isovue 300. This demonstrated no evidence of focal esophageal abnormality, esophageal leak or fold thickening. Thin barium was then used, again demonstrating no evidence of esophageal leak or focal esophageal abnormality. IMPRESSION: No evidence of esophageal leak or other esophageal abnormality. Electronically Signed   By: Charlett Nose M.D.   On: 12/17/2015 09:46        Scheduled Meds: . chlorproMAZINE (THORAZINE) IV  12.5 mg Intravenous Q6H  . cloNIDine  0.2 mg Transdermal Weekly  . fluconazole (DIFLUCAN) IV  400 mg Intravenous Q24H  . hydrALAZINE  10 mg Intravenous Q4H  . iopamidol      . metoprolol  2.5 mg Intravenous Q6H  . ondansetron (ZOFRAN) IV  4 mg Intravenous Q6H  . pantoprazole  40 mg Oral BID  . piperacillin-tazobactam (ZOSYN)  IV  3.375 g Intravenous Q8H  . potassium chloride  40 mEq Oral Daily  . promethazine  12.5 mg Intravenous Q6H  . vancomycin  750 mg Intravenous Q8H   Continuous Infusions: . dextrose 5 % and 0.45 % NaCl with KCl 20 mEq/L 10 mL/hr at 12/17/15 1800     LOS: 3 days    Time spent: 40 minutes    WOODS, Roselind Messier, MD [facility] Pager [phone]   If 7PM-7AM, please contact night-coverage [email or web address] Password [email or web address] 12/17/2015, 7:03 PM

## 2015-12-18 LAB — CBC WITH DIFFERENTIAL/PLATELET
BASOS ABS: 0 10*3/uL (ref 0.0–0.1)
Basophils Relative: 0 %
Eosinophils Absolute: 0.4 10*3/uL (ref 0.0–0.7)
Eosinophils Relative: 3 %
HEMATOCRIT: 33.3 % — AB (ref 39.0–52.0)
HEMOGLOBIN: 11.1 g/dL — AB (ref 13.0–17.0)
LYMPHS PCT: 17 %
Lymphs Abs: 1.9 10*3/uL (ref 0.7–4.0)
MCH: 33 pg (ref 26.0–34.0)
MCHC: 33.3 g/dL (ref 30.0–36.0)
MCV: 99.1 fL (ref 78.0–100.0)
MONO ABS: 1.1 10*3/uL — AB (ref 0.1–1.0)
MONOS PCT: 10 %
NEUTROS ABS: 7.8 10*3/uL — AB (ref 1.7–7.7)
Neutrophils Relative %: 70 %
Platelets: 253 10*3/uL (ref 150–400)
RBC: 3.36 MIL/uL — AB (ref 4.22–5.81)
RDW: 13.8 % (ref 11.5–15.5)
WBC: 11.2 10*3/uL — ABNORMAL HIGH (ref 4.0–10.5)

## 2015-12-18 LAB — BASIC METABOLIC PANEL
Anion gap: 10 (ref 5–15)
BUN: 7 mg/dL (ref 6–20)
CO2: 26 mmol/L (ref 22–32)
Calcium: 8.7 mg/dL — ABNORMAL LOW (ref 8.9–10.3)
Chloride: 102 mmol/L (ref 101–111)
Creatinine, Ser: 1.51 mg/dL — ABNORMAL HIGH (ref 0.61–1.24)
GFR calc Af Amer: >60 mL/min (ref >60)
GFR, EST NON AFRICAN AMERICAN: 54 mL/min — AB (ref >60)
GLUCOSE: 98 mg/dL (ref 65–99)
POTASSIUM: 3.4 mmol/L — AB (ref 3.5–5.1)
Sodium: 138 mmol/L (ref 135–145)

## 2015-12-18 LAB — MAGNESIUM: Magnesium: 1.8 mg/dL (ref 1.7–2.4)

## 2015-12-18 NOTE — Progress Notes (Signed)
PROGRESS NOTE    Anthony Atkinson  WUJ:[id number] DOB: Jul 23, 1970 DOA: 12/14/2015 PCP: Myrlene Broker, MD   Brief Narrative:  45 y.o. BM PMHx of HTN, Chronic Diastolic CHF.   He apparently woke up at 1AM on 12/14/15 complaining of abd pain.  Shortly thereafter, wife reports that pt began retching violently and complained of worsening pain in his epigastric region.  He did not have chest pain or SOB. On arrival to ED, pt was pale and diaphoretic and appeared very uncomfortable.  He apparently had already had 3 change of clothes due to him saturating each pair with sweat.  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  Pt left AMA last 04/2015.  He has not followed up with GI as outpt.   He had CT of the chest / abdomen / pelvis that demonstrated small volume pneumomediastinum with findings suspicious for esophageal rupture.  He was transferred to [facility] and PCCM was asked to see in consultation.   Subjective: 10/6  A/O 4, NAD, last 24 hours afebrile., states has been able to keep soft food down without N/V. Negative hiccups.   Assessment & Plan:   Active Problems:   Pneumomediastinum (HCC)   Chronic diastolic CHF (congestive heart failure) (HCC)   Essential hypertension   Panlobular emphysema (HCC)   Acute renal failure (HCC)   Substance abuse   Boerhaave's Syndrome/Pneumomediastinum  -Per GI Allow full liquid diet and advance to soft diet as tolerated.  - Leave current meds, except for switch to PO BID Protonix - 4th time this happened.   patient made aware of the seriousness of this diagnosis. -Continue current antibiotics will need to talk with GI/ID how long to continue antibiotic? When patient ready for discharge what antibiotic to discharge on?  Chronic Diastolic CHF -Strict in and out since admission + 10.2 L -Daily  weight Filed Weights   12/17/15 0500 12/18/15 0500 12/18/15 1506  Weight: 76.4 kg (168 lb 6.9 oz) 81.6 kg (179 lb 14.3 oz) 82.2 kg (181 lb 3.2 oz)  -Prior to admission patient not on optimal CHF medication. -Clonidine 0.2 mg patch -Metoprolol IV 2.5 mg QID -Hydralazine IV 10 mg QID -Will begin changing over IV medication to PO medication on 10/7  HTN uncontrolled   -See chronic diastolic CHF  COPD -given hx tobacco use, blebs on CT chest  -Keep O2 sats > 88%. -Smoking cessation done.   Acute renal failure (Cr admission 1.12) Lab Results  Component Value Date   CREATININE 1.51 (H) 12/18/2015   CREATININE 1.51 (H) 12/17/2015   CREATININE 1.12 12/15/2015    Substance abuse  - UDS (+) for THC. Spoke at length with patient concerning absolute need to discontinue smoking/substance abuse   Hypokalemia -Potassium goal> 4    DVT prophylaxis: SCD Code Status: Full Family Communication: None Disposition Plan: Per GI   Consultants:  Dr.Kavitha V Nandigam GI Dr [name]. 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 10/2 > re-admitted with same CT cest / abdomen / pelvis 10/2 >  volume pneumomediastinum with findings suspicious for esophageal rupture. Gastrografin Esophagogram 10/2 > defect in ant distal esophagus. No extravasation of contrast. Esophagitis.      Cultures 10/2 urine negative 10/2 blood right AC/hand/left arm/left hand NGTD  10/2 MRSA by PCR  negative    Antimicrobials: Diflucan 10/2 > Zosyn 10/2 > Vanc 10/2 >      Devices None   LINES / TUBES:  None    Continuous Infusions: . dextrose 5 % and 0.45 % NaCl with KCl 20 mEq/L Stopped (12/18/15 0100)     Objective: Vitals:   12/18/15 1200 12/18/15 1502 12/18/15 1506 12/18/15 1826  BP: 120/74 126/82  121/82  Pulse: 84 79  79  Resp: (!) 22     Temp:  97.7 F (36.5 C)    TempSrc:  Oral    SpO2: 98%  100%    Weight:   82.2 kg (181 lb 3.2 oz)   Height:   6\' 1"  (1.854 m)     Intake/Output Summary (Last 24 hours) at 12/18/15 2043 Last data filed at 12/18/15 1217  Gross per 24 hour  Intake             1191 ml  Output             1450 ml  Net             -259 ml   Filed Weights   12/17/15 0500 12/18/15 0500 12/18/15 1506  Weight: 76.4 kg (168 lb 6.9 oz) 81.6 kg (179 lb 14.3 oz) 82.2 kg (181 lb 3.2 oz)    Examination:  General: A/O 4, NAD, No acute respiratory distress Eyes: negative scleral hemorrhage, negative anisocoria, negative icterus ENT: Negative Runny nose, negative gingival bleeding, Neck:  Negative scars, masses, torticollis, lymphadenopathy, JVD Lungs: Clear to auscultation bilaterally without wheezes or crackles Cardiovascular: Regular rate and rhythm without murmur gallop or rub normal S1 and S2 Abdomen: negative abdominal pain, nondistended, positive soft, bowel sounds, no rebound, no ascites, no appreciable mass Extremities: No significant cyanosis, clubbing, or edema bilateral lower extremities Skin: Negative rashes, lesions, ulcers Psychiatric:  Negative depression, negative anxiety, negative fatigue, negative mania  Central nervous system:  Cranial nerves II through XII intact, tongue/uvula midline, all extremities muscle strength 5/5, sensation intact throughout, negative dysarthria, negative expressive aphasia, negative receptive aphasia.  .     Data Reviewed: Care during the described time interval was provided by me .  I have reviewed this patient's available data, including medical history, events of note, physical examination, and all test results as part of my evaluation. I have personally reviewed and interpreted all radiology studies.  CBC:  Recent Labs Lab 12/14/15 1053 12/14/15 1919 12/15/15 0521 12/18/15 0235  WBC 28.8* 26.6* 24.7* 11.2*  NEUTROABS  --  25.8*  --  7.8*  HGB 15.4 13.7 14.0 11.1*  HCT 42.1 38.8* 39.8 33.3*  MCV 94.0 96.3  95.9 99.1  PLT 376 303 321 253   Basic Metabolic Panel:  Recent Labs Lab 12/14/15 1053 12/14/15 1919 12/15/15 0521 12/17/15 0537 12/18/15 0235  NA 137 139 136 138 138  K 3.6 3.8 3.2* 3.2* 3.4*  CL 98* 103 103 101 102  CO2 23 23 22 24 26   GLUCOSE 146* 132* 121* 102* 98  BUN 15 9 11 10 7   CREATININE 1.16 1.00 1.12 1.51* 1.51*  CALCIUM 11.0* 9.6 9.9 9.0 8.7*  MG  --  1.7  --   --  1.8  PHOS  --  2.8  --   --   --    GFR: Estimated Creatinine Clearance: 69.1 mL/min (by C-G formula based on SCr of 1.51 mg/dL (H)). Liver Function Tests:  Recent Labs Lab 12/14/15 1053 12/14/15 1919  AST 25 22  ALT 17 14*  ALKPHOS 64 55  BILITOT 1.3* 0.6  PROT 10.3* 7.9  ALBUMIN 5.8* 4.3    Recent Labs Lab 12/14/15 1053 12/14/15 1919  LIPASE 19 22  AMYLASE  --  39   No results for input(s): AMMONIA in the last 168 hours. Coagulation Profile:  Recent Labs Lab 12/14/15 1919  INR 1.07   Cardiac Enzymes:  Recent Labs Lab 12/14/15 1456 12/14/15 1919 12/14/15 2337 12/15/15 0521  TROPONINI <0.03 0.03* 0.03* 0.03*   BNP (last 3 results) No results for input(s): PROBNP in the last 8760 hours. HbA1C: No results for input(s): HGBA1C in the last 72 hours. CBG:  Recent Labs Lab 12/16/15 0352 12/16/15 0748 12/16/15 1158 12/16/15 1540 12/16/15 2029  GLUCAP 139* 127* 120* 139* 150*   Lipid Profile: No results for input(s): CHOL, HDL, LDLCALC, TRIG, CHOLHDL, LDLDIRECT in the last 72 hours. Thyroid Function Tests: No results for input(s): TSH, T4TOTAL, FREET4, T3FREE, THYROIDAB in the last 72 hours. Anemia Panel: No results for input(s): VITAMINB12, FOLATE, FERRITIN, TIBC, IRON, RETICCTPCT in the last 72 hours. Urine analysis:    Component Value Date/Time   COLORURINE STRAW (A) 12/14/2015 1746   APPEARANCEUR CLEAR 12/14/2015 1746   LABSPEC 1.023 12/14/2015 1746   PHURINE 8.0 12/14/2015 1746   GLUCOSEU 250 (A) 12/14/2015 1746   HGBUR TRACE (A) 12/14/2015 1746    BILIRUBINUR NEGATIVE 12/14/2015 1746   KETONESUR NEGATIVE 12/14/2015 1746   PROTEINUR NEGATIVE 12/14/2015 1746   UROBILINOGEN 0.2 01/06/2015 0328   NITRITE NEGATIVE 12/14/2015 1746   LEUKOCYTESUR NEGATIVE 12/14/2015 1746   Sepsis Labs: @LABRCNTIP (procalcitonin:4,lacticidven:4)  ) Recent Results (from the past 240 hour(s))  Urine culture     Status: None   Collection Time: 12/14/15 10:49 AM  Result Value Ref Range Status   Specimen Description URINE, RANDOM  Final   Special Requests NONE  Final   Culture NO GROWTH Performed at [facility]   Final   Report Status 12/15/2015 FINAL  Final  Blood culture (routine x 2)     Status: None (Preliminary result)   Collection Time: 12/14/15 12:08 PM  Result Value Ref Range Status   Specimen Description BLOOD RIGHT ANTECUBITAL  Final   Special Requests IN PEDIATRIC BOTTLE 4 ML  Final   Culture   Final    NO GROWTH 4 DAYS Performed at [facility]    Report Status PENDING  Incomplete  Blood culture (routine x 2)     Status: None (Preliminary result)   Collection Time: 12/14/15  1:14 PM  Result Value Ref Range Status   Specimen Description BLOOD RIGHT HAND  Final   Special Requests BOTTLES DRAWN AEROBIC ONLY 1 CC  Final   Culture   Final    NO GROWTH 4 DAYS Performed at [facility]    Report Status PENDING  Incomplete  Culture, blood (routine x 2)     Status: None (Preliminary result)   Collection Time: 12/14/15  6:35 PM  Result Value Ref Range Status   Specimen Description BLOOD LEFT ARM  Final   Special Requests IN PEDIATRIC BOTTLE 2 CC  Final   Culture NO GROWTH 4 DAYS  Final   Report Status PENDING  Incomplete  Culture, blood (routine x 2)     Status: None (Preliminary result)   Collection Time: 12/14/15  6:42 PM  Result Value Ref Range Status   Specimen Description BLOOD LEFT HAND  Final   Special Requests IN PEDIATRIC BOTTLE 2 CC  Final   Culture NO GROWTH 4 DAYS  Final   Report Status PENDING   Incomplete  MRSA PCR Screening     Status: None   Collection Time: 12/14/15 11:45 PM  Result Value Ref Range Status   MRSA by PCR NEGATIVE NEGATIVE Final    Comment:        The GeneXpert MRSA Assay (FDA approved for NASAL specimens only), is one component of a comprehensive MRSA colonization surveillance program. It is not intended to diagnose MRSA infection nor to guide or monitor treatment for MRSA infections.          Radiology Studies: Dg Esophagus W/water Sol Cm  Result Date: 12/17/2015 CLINICAL DATA:  Pneumomediastinum.  Evaluate for esophageal leak. EXAM: ESOPHOGRAM/BARIUM SWALLOW TECHNIQUE: Single contrast examination was performed using water-soluble contrast followed by thin barium. FLUOROSCOPY TIME:  Fluoroscopy Time:  2 minutes 44 seconds Radiation Exposure Index (if provided by the fluoroscopic device): Number of Acquired Spot Images: 0 COMPARISON:  None. FINDINGS: The study was performed in the upright position initially using Isovue 300. This demonstrated no evidence of focal esophageal abnormality, esophageal leak or fold thickening. Thin barium was then used, again demonstrating no evidence of esophageal leak or focal esophageal abnormality. IMPRESSION: No evidence of esophageal leak or other esophageal abnormality. Electronically Signed   By: Charlett Nose M.D.   On: 12/17/2015 09:46        Scheduled Meds: . chlorproMAZINE (THORAZINE) IV  12.5 mg Intravenous Q6H  . cloNIDine  0.2 mg Transdermal Weekly  . fluconazole (DIFLUCAN) IV  400 mg Intravenous Q24H  . hydrALAZINE  10 mg Intravenous Q4H  . metoprolol  2.5 mg Intravenous Q6H  . ondansetron (ZOFRAN) IV  4 mg Intravenous Q6H  . pantoprazole  40 mg Oral BID  . piperacillin-tazobactam (ZOSYN)  IV  3.375 g Intravenous Q8H  . potassium chloride  40 mEq Oral Daily  . promethazine  12.5 mg Intravenous Q6H  . vancomycin  750 mg Intravenous Q8H   Continuous Infusions: . dextrose 5 % and 0.45 % NaCl with KCl 20  mEq/L Stopped (12/18/15 0100)     LOS: 4 days    Time spent: 40 minutes    WOODS, Roselind Messier, MD [facility] Pager [phone]   If 7PM-7AM, please contact night-coverage [email or web address] Password [email or web address] 12/18/2015, 8:43 PM

## 2015-12-18 NOTE — Progress Notes (Signed)
Pt has arrived to 2w. Telemetry box has been applied and CCMD has been notified. Pt has been oriented to room. Pt is resting in bed with call light within reach. Pt reports no pain at this time. Will continue plan of care.   Berdine DanceLauren Moffitt BSN, RN

## 2015-12-19 LAB — CULTURE, BLOOD (ROUTINE X 2)
CULTURE: NO GROWTH
Culture: NO GROWTH
Culture: NO GROWTH
Culture: NO GROWTH

## 2015-12-19 LAB — BASIC METABOLIC PANEL
Anion gap: 5 (ref 5–15)
BUN: 8 mg/dL (ref 6–20)
CHLORIDE: 105 mmol/L (ref 101–111)
CO2: 27 mmol/L (ref 22–32)
Calcium: 8.8 mg/dL — ABNORMAL LOW (ref 8.9–10.3)
Creatinine, Ser: 1.48 mg/dL — ABNORMAL HIGH (ref 0.61–1.24)
GFR calc non Af Amer: 55 mL/min — ABNORMAL LOW (ref >60)
Glucose, Bld: 119 mg/dL — ABNORMAL HIGH (ref 65–99)
POTASSIUM: 3.5 mmol/L (ref 3.5–5.1)
SODIUM: 137 mmol/L (ref 135–145)

## 2015-12-19 LAB — MAGNESIUM: MAGNESIUM: 2 mg/dL (ref 1.7–2.4)

## 2015-12-19 MED ORDER — HYDRALAZINE HCL 10 MG PO TABS: 10.0000 mg | ORAL_TABLET | Freq: Four times a day (QID) | ORAL | 0 refills | Status: DC

## 2015-12-19 MED ORDER — FLUCONAZOLE 40 MG/ML PO SUSR: 400.0000 mg | Freq: Every day | ORAL | 0 refills | Status: DC

## 2015-12-19 MED ORDER — FLUCONAZOLE 40 MG/ML PO SUSR
400.0000 mg | Freq: Every day | ORAL | Status: DC
  Administered 2015-12-19: 400 mg via ORAL
  Filled 2015-12-19: qty 10

## 2015-12-19 MED ORDER — METOPROLOL TARTRATE 25 MG/10 ML ORAL SUSPENSION: 12.5000 mg | Freq: Two times a day (BID) | ORAL | Status: DC

## 2015-12-19 MED ORDER — AMOXICILLIN-POT CLAVULANATE 400-57 MG/5ML PO SUSR: 400.0000 mg | Freq: Two times a day (BID) | ORAL | 0 refills | Status: DC

## 2015-12-19 MED ORDER — POTASSIUM CHLORIDE 20 MEQ/15ML (10%) PO SOLN: 50.0000 meq | Freq: Once | ORAL | Status: DC

## 2015-12-19 MED ORDER — PANTOPRAZOLE SODIUM 40 MG PO TBEC: 40.0000 mg | DELAYED_RELEASE_TABLET | Freq: Two times a day (BID) | ORAL | 0 refills | Status: DC

## 2015-12-19 MED ORDER — HYDRALAZINE HCL 10 MG PO TABS: 10.0000 mg | ORAL_TABLET | Freq: Four times a day (QID) | ORAL | Status: DC

## 2015-12-19 MED ORDER — AMOXICILLIN-POT CLAVULANATE 400-57 MG/5ML PO SUSR
400.0000 mg | Freq: Two times a day (BID) | ORAL | Status: DC
  Administered 2015-12-19: 400 mg via ORAL
  Filled 2015-12-19: qty 5

## 2015-12-19 MED ORDER — METOPROLOL TARTRATE 25 MG/10 ML ORAL SUSPENSION: 12.5000 mg | Freq: Two times a day (BID) | ORAL | 0 refills | Status: DC

## 2015-12-19 MED ORDER — CLONIDINE HCL 0.2 MG/24HR TD PTWK: 0.2000 mg | MEDICATED_PATCH | TRANSDERMAL | 0 refills | Status: DC

## 2015-12-19 NOTE — Progress Notes (Signed)
Pt in room. Irritated IVs keep going bad. Does not want to be stuck again. Pt agreeable to wait for MD for a game plan and alternative routes for meds. Pt currently getting cleaned up. Call bell and phone within reach. Will continue to monitor.

## 2015-12-19 NOTE — Progress Notes (Signed)
Pt returned to unit. Wife at bedside. Call bell and phone within reach.

## 2015-12-19 NOTE — Progress Notes (Signed)
Pt very irritated and agitated. Pt off unit with wife. Outside by subway. Wife has my phone number. Pt told to return in [id number]. CCMD  Called. Will continue to monitor.

## 2015-12-19 NOTE — Discharge Summary (Addendum)
Physician Discharge Summary  Anthony Atkinson ZOX:[id number] DOB: 07/24/1970 DOA: 12/14/2015  PCP: Myrlene Broker, MD  Admit date: 12/14/2015 Discharge date: 12/19/2015  Time spent: 35 minutes  Recommendations for Outpatient Follow-up:  Boerhaave's Syndrome/Pneumomediastinum  -Per Dennison Mascot full liquid diet and advance to soft diet as tolerated.  -Leave current meds, except for switch to PO BID Protonix - 4th time this happened.  patient made aware of the seriousness of this diagnosis. -Spoke with Dr. Enedina Finner ID and he concurred with choice of oral medication and felt a course of 3 weeks treatment would be adequate. Will use 10/2 as start date on antibiotics: Antibiotics stop date 10/23 -Per Dr.Kavitha V [facility] follow-up as needed. Ensure you avoid  meat, bread, raw vegetables, chunky solids for next 2 weeks . Abstain from marijuana.  Chronic Diastolic CHF -Strict in and out since admission + 10.2 L -Daily weight Filed Weights   12/18/15 0500 12/18/15 1506 12/19/15 0541  Weight: 81.6 kg (179 lb 14.3 oz) 82.2 kg (181 lb 3.2 oz) 81.4 kg (179 lb 6.4 oz)  -Prior to admission patient not on optimal CHF medication. -Clonidine 0.2 mg patch -Metoprolol  12.5 mg BID -Hydralazine 10 mg QID -Follow-up with Dr. Hillard Danker after you return from vacation to make adjustments to BP medication    Discharge Diagnoses:  Active Problems:   Pneumomediastinum (HCC)   Chronic diastolic CHF (congestive heart failure) (HCC)   Essential hypertension   Panlobular emphysema (HCC)   Acute renal failure (HCC)   Substance abuse   Discharge Condition: Stable  Diet recommendation: Soft diet: Ensure you avoid  meat, bread, raw vegetables, chunky solids for next 2 weeks. Heart healthy  Filed Weights   12/18/15 0500 12/18/15 1506 12/19/15 0541  Weight: 81.6 kg (179 lb 14.3 oz) 82.2 kg (181 lb 3.2 oz) 81.4 kg (179 lb 6.4 oz)    History of present illness:  45 y.o.BM PMHx of  HTN, Chronic Diastolic CHF.   He apparently woke up at 1AM on 12/14/15 complaining of abd pain. Shortly thereafter, wife reports that pt began retching violently and complained of worsening pain in his epigastric region. He did not have chest pain or SOB. On arrival to ED, pt was pale and diaphoretic and appeared very uncomfortable. He apparently had already had 3 change of clothes due to him saturating each pair with sweat. 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  Pt left AMA last 04/2015. He has not followed up with GI as outpt.   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 It was decided to treat patient conservatively with antibiotics and change in his diet to initially clear liquids and now soft diet. Currently patient asymptomatic and stable for discharge.   Consultants:  WJ.[email or web address] V [facility] GI Dr [name]. PC CM Dr. 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 10/2 > re-admitted with same CT cest / abdomen / pelvis 10/2 >volume pneumomediastinum with findings suspicious for esophageal rupture. Gastrografin Esophagogram 10/2 >defect in ant distal esophagus. No extravasation of contrast. Esophagitis.      Cultures 10/2 urine negative  10/2 blood right AC/hand/left arm/left hand NGTD  10/2 MRSA by PCR negative    Antimicrobials: Diflucan 10/2 >> 10/23 Zosyn 10/2 >10/7 Vanc 10/2 > 10/7 Augmentin 10/7>> 10/23   Discharge Exam: Vitals:   12/18/15 1826 12/18/15 2049 12/18/15 2358 12/19/15 0541   BP: 121/82 134/79 125/75 129/83  Pulse: 79 72 71 75  Resp:  20  18  Temp:  98.7 F (37.1 C)  98.7 F (37.1 C)  TempSrc:  Oral  Oral  SpO2:  100%  99%  Weight:    81.4 kg (179 lb 6.4 oz)  Height:        General: A/O 4, NAD, No acute respiratory distress Eyes: negative scleral hemorrhage, negative anisocoria, negative icterus ENT: Negative Runny nose, negative gingival bleeding, Neck:  Negative scars, masses, torticollis, lymphadenopathy, JVD Lungs: Clear to auscultation bilaterally without wheezes or crackles Cardiovascular: Regular rate and rhythm without murmur gallop or rub normal S1 and S2 Abdomen: negative abdominal pain, nondistended, positive soft, bowel sounds, no rebound, no ascites, no appreciable mass  Discharge Instructions     Medication List    STOP taking these medications   cloNIDine 0.2 MG tablet Commonly known as:  CATAPRES Replaced by:  cloNIDine 0.2 mg/24hr patch   doxycycline 100 MG capsule Commonly known as:  VIBRAMYCIN   HYDROcodone-acetaminophen 5-325 MG tablet Commonly known as:  NORCO/VICODIN   lisinopril-hydrochlorothiazide 20-25 MG tablet Commonly known as:  PRINZIDE,ZESTORETIC     TAKE these medications   amoxicillin-clavulanate 400-57 MG/5ML suspension Commonly known as:  AUGMENTIN Take 5 mLs (400 mg total) by mouth every 12 (twelve) hours.   cloNIDine 0.2 mg/24hr patch Commonly known as:  CATAPRES - Dosed in mg/24 hr Place 1 patch (0.2 mg total) onto the skin once a week. Start taking on:  12/23/2015 Replaces:  cloNIDine 0.2 MG tablet   fluconazole 40 MG/ML suspension Commonly known as:  DIFLUCAN Take 10 mLs (400 mg total) by mouth daily.   hydrALAZINE 10 MG tablet Commonly known as:  APRESOLINE Take 1 tablet (10 mg total) by mouth every 6 (six) hours. What changed:  medication strength  how much to take  when to take this   metoprolol tartrate 25 mg/10 mL Susp Commonly known as:  LOPRESSOR Take 5 mLs (12.5 mg  total) by mouth 2 (two) times daily.   pantoprazole 40 MG tablet Commonly known as:  PROTONIX Take 1 tablet (40 mg total) by mouth 2 (two) times daily.      No Known Allergies Follow-up Information    Myrlene Broker, MD. Schedule an appointment as soon as possible for a visit today.   Specialty:  Internal Medicine Why:  Follow-up with Dr. Hillard Danker after you return from vacation to make adjustments to BP medication Contact information: [address] [place] [address] [phone]            The results of significant diagnostics from this hospitalization (including imaging, microbiology, ancillary and laboratory) are listed below for reference.    Significant Diagnostic Studies: Ct Chest Without Contrast  Result Date: 12/14/2015 CLINICAL DATA:  Difficulty swallowing beginning this morning. Question esophageal perforation. EXAM: CT CHEST WITHOUT CONTRAST TECHNIQUE: Multidetector CT imaging of the chest was performed following the standard protocol without IV contrast. COMPARISON:  CT chest, abdomen and pelvis and esophagram earlier today. FINDINGS: Cardiovascular: Unremarkable. Mediastinum/Nodes: A small amount of pneumomediastinum is identified as seen on the CT earlier today. On axial images 133 and 134 contrast penetrates into the anterior  wall of the esophagus but there is no extravasation of contrast into the mediastinum or pleural space. The walls of the distal esophagus are thickened. Lungs/Pleura: The lungs are emphysematous but clear. Upper Abdomen: Unremarkable. Musculoskeletal: No focal abnormality. IMPRESSION: Findings concerning for defect in the anterior distal esophagus with contrast seen within the esophageal wall. There may be a microperforation causing pneumomediastinum but no contrast extravasates into the mediastinum or pleural space. The walls of the distal esophagus appear thickened suggestive of esophagitis. Electronically Signed   By: Drusilla Kanner M.D.   On: 12/14/2015 20:34   Ct Chest W Contrast  Result Date: 12/14/2015 CLINICAL DATA:  Abdominal pain with nausea and vomiting. History of Boerhaave syndrome. EXAM: CT CHEST, ABDOMEN, AND PELVIS WITH CONTRAST TECHNIQUE: Multidetector CT imaging of the chest, abdomen and pelvis was performed following the standard protocol during bolus administration of intravenous contrast. CONTRAST:  ISOVUE-300 IOPAMIDOL (ISOVUE-300) INJECTION 61% COMPARISON:  CT abdomen pelvis 04/23/2015. FINDINGS: CT CHEST FINDINGS Cardiovascular: The heart size is normal. There is no pericardial effusion. There is a normal 3-vessel aortic branching pattern. There is no aortic atherosclerosis. Mediastinum/Nodes: There is pneumomediastinum with air anterior to the esophagus tracking superiorly posterior to the pulmonary artery. Air is also seen anterior to the aortic outflow tract. Thyroid is unremarkable. No mediastinal, hilar or axillary lymphadenopathy. Lungs/Pleura: Multiple posterior biapical cystic spaces. There are multiple nodular subpleural opacities along the left diaphragmatic dome. Lungs are otherwise normal. No pleural effusion. 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 This is unchanged from the prior study of 04/23/2015 No biliary dilatation. The gallbladder is normal. Pancreas: Pancreatic contours are normal. No abnormal calcifications or mass lesions. No peripancreatic fluid collection. No pancreatic ductal dilatation. Spleen: Normal Adrenals/Urinary Tract: Adrenal glands are normal. No hydronephrosis or solid renal mass. Stomach/Bowel: No dilated small bowel or other evidence of obstruction. No enteric or colonic inflammation. The appendix is normal. Vascular/Lymphatic: No abdominal aortic aneurysm. The inferior vena cava, portal vein, splenic vein and superior  mesenteric vein are patent. The aorta, bilateral renal arteries, celiac axis and superior mesenteric artery are patent. The visualized iliac vessels are normal. Mild aortic atherosclerotic calcification. No pelvic or abdominal lymphadenopathy. Reproductive: Prostate is enlarged, measuring 5 cm in transverse dimension. Other: None Musculoskeletal: No bony spinal canal stenosis. No lytic or blastic lesions. IMPRESSION: 1. Small volume pneumomediastinum, with air tracking anterior to the proximal ascending aorta, posterior to the pulmonary arteries and medially along the right mainstem bronchus. The findings likely indicate esophageal rupture. 2. Mild aortic atherosclerosis. 3. Unchanged subpleural nodular opacities of the left of my down. These results will be called to the ordering clinician or representative by the Radiologist Assistant, and communication documented in the PACS or zVision Dashboard. Electronically Signed   By: Deatra Robinson M.D.   On: 12/14/2015 14:57   Ct Abdomen Pelvis W Contrast  Result Date: 12/14/2015 CLINICAL DATA:  Abdominal pain with nausea and vomiting. History of Boerhaave syndrome. EXAM: CT CHEST, ABDOMEN, AND PELVIS WITH CONTRAST TECHNIQUE: Multidetector CT imaging of the chest, abdomen and pelvis was performed following the standard protocol during bolus administration of intravenous contrast. CONTRAST:  ISOVUE-300 IOPAMIDOL (ISOVUE-300) INJECTION 61% COMPARISON:  CT abdomen pelvis 04/23/2015. FINDINGS: CT CHEST FINDINGS Cardiovascular: The heart size is normal. There is no pericardial effusion. There is a normal 3-vessel aortic branching pattern. There is no aortic  atherosclerosis. Mediastinum/Nodes: There is pneumomediastinum with air anterior to the esophagus tracking superiorly posterior to the pulmonary artery. Air is also seen anterior to the aortic outflow tract. Thyroid is unremarkable. No mediastinal, hilar or axillary lymphadenopathy. Lungs/Pleura: Multiple posterior  biapical cystic spaces. There are multiple nodular subpleural opacities along the left diaphragmatic dome. Lungs are otherwise normal. No pleural effusion. 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 This is unchanged from the prior study of 04/23/2015 No biliary dilatation. The gallbladder is normal. Pancreas: Pancreatic contours are normal. No abnormal calcifications or mass lesions. No peripancreatic fluid collection. No pancreatic ductal dilatation. Spleen: Normal Adrenals/Urinary Tract: Adrenal glands are normal. No hydronephrosis or solid renal mass. Stomach/Bowel: No dilated small bowel or other evidence of obstruction. No enteric or colonic inflammation. The appendix is normal. Vascular/Lymphatic: No abdominal aortic aneurysm. The inferior vena cava, portal vein, splenic vein and superior mesenteric vein are patent. The aorta, bilateral renal arteries, celiac axis and superior mesenteric artery are patent. The visualized iliac vessels are normal. Mild aortic atherosclerotic calcification. No pelvic or abdominal lymphadenopathy. Reproductive: Prostate is enlarged, measuring 5 cm in transverse dimension. Other: None Musculoskeletal: No bony spinal canal stenosis. No lytic or blastic lesions. IMPRESSION: 1. Small volume pneumomediastinum, with air tracking anterior to the proximal ascending aorta, posterior to the pulmonary arteries and medially along the right mainstem bronchus. The findings likely indicate esophageal rupture. 2. Mild aortic atherosclerosis. 3. Unchanged subpleural nodular opacities of the left of my down. These results will be called to the ordering clinician or representative by the Radiologist Assistant, and communication documented in the PACS or zVision Dashboard. Electronically Signed   By: Deatra Robinson M.D.   On: 12/14/2015 14:57   Dg Chest Port 1  View  Result Date: 12/15/2015 CLINICAL DATA:  45 year old male with dyspnea and cough. Subsequent encounter. EXAM: PORTABLE CHEST 1 VIEW COMPARISON:  12/14/2015 CT and chest x-ray. FINDINGS: CT detected pneumomediastinum not appreciated by plain film exam. Lungs clear. Heart size within normal limits. IMPRESSION: CT detected pneumomediastinum not appreciated by plain film exam. Lungs clear. Electronically Signed   By: Lacy Duverney M.D.   On: 12/15/2015 07:45   Dg Abdomen Acute W/chest  Result Date: 12/14/2015 CLINICAL DATA:  Abdominal pain. EXAM: DG ABDOMEN ACUTE W/ 1V CHEST COMPARISON:  04/24/2015.  CT 04/23/2015. FINDINGS: Soft tissue structures are unremarkable. No acute cardiopulmonary disease. No acute intra-abdominal disease. No bowel distention. Prominent amount of stool noted throughout the colon. No free air. Pelvic calcifications consistent phleboliths. Leads noted over the abdomen in chest. IMPRESSION: Number No acute cardiopulmonary disease. 2. No acute intra-abdominal abnormality. Prominent amount of stool noted throughout colon. Constipation cannot be excluded. Electronically Signed   By: Maisie Fus  Register   On: 12/14/2015 12:45   Dg Esophagus W/water Marchelle Folks Cm  Result Date: 12/17/2015 CLINICAL DATA:  Pneumomediastinum.  Evaluate for esophageal leak. EXAM: ESOPHOGRAM/BARIUM SWALLOW TECHNIQUE: Single contrast examination was performed using water-soluble contrast followed by thin barium. FLUOROSCOPY TIME:  Fluoroscopy Time:  2 minutes 44 seconds Radiation Exposure Index (if provided by the fluoroscopic device): Number of Acquired Spot Images: 0 COMPARISON:  None. FINDINGS: The study was performed in the upright position initially using Isovue 300. This demonstrated no evidence of focal esophageal abnormality, esophageal leak or fold thickening. Thin barium was then used, again demonstrating no evidence of esophageal leak or focal esophageal abnormality. IMPRESSION: No  evidence of esophageal leak  or other esophageal abnormality. Electronically Signed   By: Charlett Nose M.D.   On: 12/17/2015 09:46   Dg Esophagus W/water Sol Cm  Result Date: 12/14/2015 CLINICAL DATA:  45 year old male with pneumomediastinum seen on the earlier CT. EXAM: ESOPHOGRAM/BARIUM SWALLOW TECHNIQUE: Single contrast examination was performed using  Isovue-300. FLUOROSCOPY TIME:  Fluoroscopy Time:  48 seconds Number of Acquired Spot Images: 7 COMPARISON:  CT dated 12/14/2015 FINDINGS: Evaluation is limited as the patient could not tolerate this study and could not swallow adequate amount of contrast. Scout radiograph demonstrates small amount of pneumomediastinum. The cardiac silhouette is within normal limits. The aorta is slightly tortuous. The visualized lungs are clear. Oral contrast noted throughout the esophagus entering the stomach. No extraluminal contrast or extension of contrast outside of the esophageal contour within the mediastinum identified. Gastroesophageal reflux noted during coughing. Patient subsequently vomited. IMPRESSION: No definite extraluminal contrast identified to suggest leak. Electronically Signed   By: Elgie Collard M.D.   On: 12/14/2015 19:32    Microbiology: Recent Results (from the past 240 hour(s))  Urine culture     Status: None   Collection Time: 12/14/15 10:49 AM  Result Value Ref Range Status   Specimen Description URINE, RANDOM  Final   Special Requests NONE  Final   Culture NO GROWTH Performed at [facility]   Final   Report Status 12/15/2015 FINAL  Final  Blood culture (routine x 2)     Status: None (Preliminary result)   Collection Time: 12/14/15 12:08 PM  Result Value Ref Range Status   Specimen Description BLOOD RIGHT ANTECUBITAL  Final   Special Requests IN PEDIATRIC BOTTLE 4 ML  Final   Culture   Final    NO GROWTH 4 DAYS Performed at [facility]    Report Status PENDING  Incomplete  Blood culture (routine x 2)     Status: None (Preliminary  result)   Collection Time: 12/14/15  1:14 PM  Result Value Ref Range Status   Specimen Description BLOOD RIGHT HAND  Final   Special Requests BOTTLES DRAWN AEROBIC ONLY 1 CC  Final   Culture   Final    NO GROWTH 4 DAYS Performed at [facility]    Report Status PENDING  Incomplete  Culture, blood (routine x 2)     Status: None (Preliminary result)   Collection Time: 12/14/15  6:35 PM  Result Value Ref Range Status   Specimen Description BLOOD LEFT ARM  Final   Special Requests IN PEDIATRIC BOTTLE 2 CC  Final   Culture NO GROWTH 4 DAYS  Final   Report Status PENDING  Incomplete  Culture, blood (routine x 2)     Status: None (Preliminary result)   Collection Time: 12/14/15  6:42 PM  Result Value Ref Range Status   Specimen Description BLOOD LEFT HAND  Final   Special Requests IN PEDIATRIC BOTTLE 2 CC  Final   Culture NO GROWTH 4 DAYS  Final   Report Status PENDING  Incomplete  MRSA PCR Screening     Status: None   Collection Time: 12/14/15 11:45 PM  Result Value Ref Range Status   MRSA by PCR NEGATIVE NEGATIVE Final    Comment:        The GeneXpert MRSA Assay (FDA approved for NASAL specimens only), is one component of a comprehensive MRSA colonization surveillance program. It is not intended to diagnose MRSA infection nor to guide or monitor treatment for  MRSA infections.      Labs: Basic Metabolic Panel:  Recent Labs Lab 12/14/15 1919 12/15/15 0521 12/17/15 0537 12/18/15 0235 12/19/15 1022  NA 139 136 138 138 137  K 3.8 3.2* 3.2* 3.4* 3.5  CL 103 103 101 102 105  CO2 23 22 24 26 27   GLUCOSE 132* 121* 102* 98 119*  BUN 9 11 10 7 8   CREATININE 1.00 1.12 1.51* 1.51* 1.48*  CALCIUM 9.6 9.9 9.0 8.7* 8.8*  MG 1.7  --   --  1.8 2.0  PHOS 2.8  --   --   --   --    Liver Function Tests:  Recent Labs Lab 12/14/15 1053 12/14/15 1919  AST 25 22  ALT 17 14*  ALKPHOS 64 55  BILITOT 1.3* 0.6  PROT 10.3* 7.9  ALBUMIN 5.8* 4.3    Recent Labs Lab  12/14/15 1053 12/14/15 1919  LIPASE 19 22  AMYLASE  --  39   No results for input(s): AMMONIA in the last 168 hours. CBC:  Recent Labs Lab 12/14/15 1053 12/14/15 1919 12/15/15 0521 12/18/15 0235  WBC 28.8* 26.6* 24.7* 11.2*  NEUTROABS  --  25.8*  --  7.8*  HGB 15.4 13.7 14.0 11.1*  HCT 42.1 38.8* 39.8 33.3*  MCV 94.0 96.3 95.9 99.1  PLT 376 303 321 253   Cardiac Enzymes:  Recent Labs Lab 12/14/15 1456 12/14/15 1919 12/14/15 2337 12/15/15 0521  TROPONINI <0.03 0.03* 0.03* 0.03*   BNP: BNP (last 3 results) No results for input(s): BNP in the last 8760 hours.  ProBNP (last 3 results) No results for input(s): PROBNP in the last 8760 hours.  CBG:  Recent Labs Lab 12/16/15 0352 12/16/15 0748 12/16/15 1158 12/16/15 1540 12/16/15 2029  GLUCAP 139* 127* 120* 139* 150*       Signed:  Carolyne Littlesurtis Desta Bujak, MD [facility] [phone] pager

## 2016-01-12 ENCOUNTER — Encounter: Payer: Self-pay | Admitting: Internal Medicine

## 2016-01-12 ENCOUNTER — Other Ambulatory Visit (INDEPENDENT_AMBULATORY_CARE_PROVIDER_SITE_OTHER): Payer: 59

## 2016-01-12 ENCOUNTER — Ambulatory Visit (INDEPENDENT_AMBULATORY_CARE_PROVIDER_SITE_OTHER): Payer: 59 | Admitting: Internal Medicine

## 2016-01-12 VITALS — BP 191/118 | HR 62 | Temp 98.3°F | Resp 12 | Ht 73.0 in | Wt 189.0 lb

## 2016-01-12 DIAGNOSIS — I10 Essential (primary) hypertension: Secondary | ICD-10-CM

## 2016-01-12 DIAGNOSIS — N179 Acute kidney failure, unspecified: Secondary | ICD-10-CM

## 2016-01-12 DIAGNOSIS — K223 Perforation of esophagus: Secondary | ICD-10-CM

## 2016-01-12 DIAGNOSIS — Z720 Tobacco use: Secondary | ICD-10-CM

## 2016-01-12 DIAGNOSIS — I5032 Chronic diastolic (congestive) heart failure: Secondary | ICD-10-CM

## 2016-01-12 LAB — CBC
HEMATOCRIT: 37.5 % — AB (ref 39.0–52.0)
Hemoglobin: 12.8 g/dL — ABNORMAL LOW (ref 13.0–17.0)
MCHC: 34.2 g/dL (ref 30.0–36.0)
MCV: 97.8 fl (ref 78.0–100.0)
Platelets: 291 10*3/uL (ref 150.0–400.0)
RBC: 3.84 Mil/uL — ABNORMAL LOW (ref 4.22–5.81)
RDW: 15.9 % — AB (ref 11.5–15.5)
WBC: 6.8 10*3/uL (ref 4.0–10.5)

## 2016-01-12 LAB — COMPREHENSIVE METABOLIC PANEL
ALBUMIN: 4.2 g/dL (ref 3.5–5.2)
ALT: 20 U/L (ref 0–53)
AST: 16 U/L (ref 0–37)
Alkaline Phosphatase: 51 U/L (ref 39–117)
BUN: 7 mg/dL (ref 6–23)
CHLORIDE: 104 meq/L (ref 96–112)
CO2: 31 meq/L (ref 19–32)
Calcium: 9.7 mg/dL (ref 8.4–10.5)
Creatinine, Ser: 0.87 mg/dL (ref 0.40–1.50)
GFR: 121.14 mL/min (ref >60.00)
GLUCOSE: 94 mg/dL (ref 70–99)
POTASSIUM: 3.3 meq/L — AB (ref 3.5–5.1)
SODIUM: 144 meq/L (ref 135–145)
Total Bilirubin: 0.5 mg/dL (ref 0.2–1.2)
Total Protein: 7.9 g/dL (ref 6.0–8.3)

## 2016-01-12 LAB — TROPONIN I: TNIDX: 0 ug/L (ref 0.00–0.06)

## 2016-01-12 MED ORDER — LISINOPRIL-HYDROCHLOROTHIAZIDE 20-25 MG PO TABS: 1.0000 | ORAL_TABLET | Freq: Every day | ORAL | 3 refills | Status: DC

## 2016-01-12 MED ORDER — CLONIDINE HCL 0.2 MG PO TABS: 0.2000 mg | ORAL_TABLET | Freq: Two times a day (BID) | ORAL | 3 refills | Status: DC

## 2016-01-12 MED ORDER — HYDRALAZINE HCL 25 MG PO TABS: 25.0000 mg | ORAL_TABLET | Freq: Three times a day (TID) | ORAL | 3 refills | Status: DC

## 2016-01-12 NOTE — Assessment & Plan Note (Signed)
Finished antibiotics and antifungals. Eating normally again. At this time no further treatment indicated.

## 2016-01-12 NOTE — Progress Notes (Signed)
   Subjective:    Patient ID: Anthony Atkinson, male    DOB: 02/03/1971, 45 y.o.   MRN: [id number]  HPI The patient is a 45 YO man coming in for hospital follow up (in for esophageal perforation and malignant hypertension, treated with drips in the ICU and discharged on regimen, had AKI in the hospital). He is not taking the new regimen of metoprolol, clonidine patch, and hydralazine 10 mg QID (previously taking 25 mg TID). He is forgetting to take last dose of hydralazine every day. Denies headaches, chest pains, nausea, fatigue, SOB, confusion, lethargy. He wants to get back on his previous regimen as it was working better. He is now eating normally again and no problems with swallowing pills. Denies pain with swallowing or dysphagia. No vomiting. No constipation or diarrhea.   Review of Systems  Constitutional: Negative for activity change, appetite change, fatigue, fever and unexpected weight change.  HENT: Negative.   Eyes: Negative.   Respiratory: Negative for cough, shortness of breath and wheezing.   Cardiovascular: Negative for chest pain, palpitations and leg swelling.  Gastrointestinal: Negative for abdominal distention, abdominal pain, constipation, diarrhea, nausea and vomiting.  Musculoskeletal: Negative.   Skin: Negative.   Neurological: Negative.   Psychiatric/Behavioral: Negative.       Objective:   Physical Exam  Constitutional: He is oriented to person, place, and time. He appears well-developed and well-nourished. No distress.  HENT:  Head: Normocephalic and atraumatic.  Eyes: EOM are normal.  Neck: Normal range of motion.  Cardiovascular: Normal rate and regular rhythm.   Pulmonary/Chest: Effort normal and breath sounds normal. No respiratory distress. He has no wheezes. He has no rales.  Abdominal: Soft. Bowel sounds are normal. He exhibits no distension. There is no tenderness. There is no rebound.  Musculoskeletal: He exhibits no edema.  Neurological: He is alert  and oriented to person, place, and time. Coordination normal.  Skin: Skin is warm and dry. He is not diaphoretic.   Vitals:   01/12/16 1013 01/12/16 1019 01/12/16 1037  BP: (!) 250/132 (!) 230/120 (!) 191/118  Pulse: 62    Resp: 12    Temp: 98.3 F (36.8 C)    TempSrc: Oral    SpO2: 98%    Weight: 189 lb (85.7 kg)    Height: 6\' 1"  (1.854 m)        Assessment & Plan:

## 2016-01-12 NOTE — Assessment & Plan Note (Signed)
Rx for lisinopril/hctz 20/25 mg, hydralazine 25 mg TID and clonidine 0.2 BID. Checking CMP and troponin and CBC. Nurse visit in 1 week on his changed routine and if still high needs further adjustment. Talked to him about the risk of long term kidney damage from high blood pressure and dialysis if not controlled.

## 2016-01-12 NOTE — Assessment & Plan Note (Signed)
Still smoking and not willing to quit at this time. Counseled on the emphysema found in the hospital and his risk of further worsening of his lungs with continued smoking.

## 2016-01-12 NOTE — Assessment & Plan Note (Signed)
No flare today, likely due to uncontrolled hypertension and we talked about the risk of worsening with uncontrolled pressures.

## 2016-01-12 NOTE — Assessment & Plan Note (Signed)
Recheck CMP today.  °

## 2016-01-12 NOTE — Patient Instructions (Addendum)
We have sent in the blood pressure medicines for you.  Take:  1. Lisinopril/hctz 20/25 mg 1 pill daily  2. Clonidine 0.2 mg 1 pill twice a day  3. Hydralazine 25 mg 1 pill three times a day.    We need to see you back for a nurse visit in 1 week, bring your home meter with you.

## 2016-01-12 NOTE — Progress Notes (Signed)
Pre visit review using our clinic review tool, if applicable. No additional management support is needed unless otherwise documented below in the visit note. 

## 2016-01-19 ENCOUNTER — Encounter: Payer: Self-pay | Admitting: Internal Medicine

## 2016-01-19 ENCOUNTER — Ambulatory Visit (INDEPENDENT_AMBULATORY_CARE_PROVIDER_SITE_OTHER): Payer: 59 | Admitting: Internal Medicine

## 2016-01-19 DIAGNOSIS — I10 Essential (primary) hypertension: Secondary | ICD-10-CM

## 2016-01-19 MED ORDER — CLONIDINE HCL 0.3 MG/24HR TD PTWK: 0.3000 mg | MEDICATED_PATCH | TRANSDERMAL | 3 refills | Status: DC

## 2016-01-19 NOTE — Assessment & Plan Note (Signed)
Taking lisinopril/hctz and BP is much lower today. Also taking hydralazine 25 mg TID. Will change clonidine back to weekly patch and increase dose to 0.3 mg daily from 0.2 mg for the elevation. Back in 1-2 months for BP and labs.

## 2016-01-19 NOTE — Progress Notes (Signed)
   Subjective:    Patient ID: Anthony Atkinson, male    DOB: 05/18/1970, 45 y.o.   MRN: [id number]  HPI The patient is a 45 YO man coming in for follow up of blood pressure. He is now taking lisinopril/hctz and hydralazine and clonidine. He is doing well with remembering to take the pills. His blood pressures are coming down overall. Some high readings at night time still. No headaches or chest pains or SOB or abdominal pain.  Review of Systems  Constitutional: Negative.   Respiratory: Negative.   Cardiovascular: Negative.   Gastrointestinal: Negative.   Musculoskeletal: Negative.   Skin: Negative.       Objective:   Physical Exam  Constitutional: He is oriented to person, place, and time. He appears well-developed and well-nourished.  HENT:  Head: Normocephalic and atraumatic.  Eyes: EOM are normal.  Cardiovascular: Normal rate and regular rhythm.   Pulmonary/Chest: Effort normal and breath sounds normal. No respiratory distress. He has no wheezes.  Abdominal: Soft. He exhibits no distension. There is no tenderness. There is no rebound.  Neurological: He is alert and oriented to person, place, and time.  Skin: Skin is warm and dry.   Vitals:   01/19/16 0932 01/19/16 0953  BP: (!) 152/90 140/70  Pulse: 86   Resp: 16   Temp: 98.7 F (37.1 C)   TempSrc: Oral   SpO2: 98%   Weight: 188 lb (85.3 kg)   Height: 6\' 1"  (1.854 m)       Assessment & Plan:

## 2016-01-19 NOTE — Patient Instructions (Signed)
We have sent in the patch which will replace the clonidine.   Use the patch once a week.   Keep taking the lisinopril/hctz 1 pill daily and the hydralazine 1 pill 3 times per day.   Come back in about 1-2 months and we will check the cholesterol then.   Stay away from smoking and keep working on doing good with the blood pressure.

## 2016-01-19 NOTE — Progress Notes (Signed)
Pre visit review using our clinic review tool, if applicable. No additional management support is needed unless otherwise documented below in the visit note. 

## 2016-02-16 ENCOUNTER — Other Ambulatory Visit (INDEPENDENT_AMBULATORY_CARE_PROVIDER_SITE_OTHER): Payer: 59

## 2016-02-16 ENCOUNTER — Encounter: Payer: Self-pay | Admitting: Internal Medicine

## 2016-02-16 ENCOUNTER — Ambulatory Visit (INDEPENDENT_AMBULATORY_CARE_PROVIDER_SITE_OTHER): Payer: 59 | Admitting: Internal Medicine

## 2016-02-16 VITALS — BP 132/76 | HR 68 | Temp 98.4°F | Resp 14 | Ht 73.0 in | Wt 190.0 lb

## 2016-02-16 DIAGNOSIS — I10 Essential (primary) hypertension: Secondary | ICD-10-CM

## 2016-02-16 LAB — BASIC METABOLIC PANEL
BUN: 14 mg/dL (ref 6–23)
CHLORIDE: 102 meq/L (ref 96–112)
CO2: 29 meq/L (ref 19–32)
Calcium: 9.8 mg/dL (ref 8.4–10.5)
Creatinine, Ser: 1 mg/dL (ref 0.40–1.50)
GFR: 103.11 mL/min (ref >60.00)
GLUCOSE: 103 mg/dL — AB (ref 70–99)
POTASSIUM: 3.9 meq/L (ref 3.5–5.1)
SODIUM: 139 meq/L (ref 135–145)

## 2016-02-16 LAB — LIPID PANEL
Cholesterol: 218 mg/dL — ABNORMAL HIGH (ref 0–200)
HDL: 49.1 mg/dL (ref >39.00)
LDL CALC: 136 mg/dL — AB (ref 0–99)
NONHDL: 168.95
Total CHOL/HDL Ratio: 4
Triglycerides: 167 mg/dL — ABNORMAL HIGH (ref 0.0–149.0)
VLDL: 33.4 mg/dL (ref 0.0–40.0)

## 2016-02-16 NOTE — Assessment & Plan Note (Signed)
BP is at goal today and he is committed to staying on medication. He is taking hydralazine 25 mg BID and clonidine patch 0.3 and lisinopril/hctz 20/25 mg. Checking BMP today as back on ACE-I. Adjust as needed.

## 2016-02-16 NOTE — Progress Notes (Signed)
   Subjective:    Patient ID: Randell LoopBrian Fraiser, male    DOB: 07/25/1970, 45 y.o.   MRN: [id number]  HPI The patient is a 45 YO man coming in for follow up of his blood pressure. He has increased back to clonidine patch 0.3 and taking hydralazine and lisinopril/hctz. This was making his blood pressure go to low (getting dizzy and 80s/50s) so he changed his hydralazine to 25 BID (instead of TID) and now is doing well. Most readings 110s/60s-70s. No headaches or chest pains. Feels good.   Review of Systems  Constitutional: Negative.   Respiratory: Negative for cough, chest tightness and shortness of breath.   Cardiovascular: Negative for chest pain, palpitations and leg swelling.  Gastrointestinal: Negative for abdominal distention, abdominal pain, constipation, diarrhea, nausea and vomiting.  Musculoskeletal: Negative.   Skin: Negative.   Neurological: Negative.       Objective:   Physical Exam  Constitutional: He appears well-developed and well-nourished.  HENT:  Head: Normocephalic and atraumatic.  Eyes: EOM are normal.  Neck: Normal range of motion.  Cardiovascular: Normal rate and regular rhythm.   Pulmonary/Chest: Effort normal. No respiratory distress. He has no wheezes. He has no rales.  Abdominal: Soft. He exhibits no distension. There is no tenderness. There is no rebound.  Neurological: He is alert. Coordination normal.  Skin: Skin is warm and dry.   Vitals:   02/16/16 0845  BP: 132/76  Pulse: 68  Resp: 14  Temp: 98.4 F (36.9 C)  TempSrc: Oral  SpO2: 94%  Weight: 190 lb (86.2 kg)  Height: 6\' 1"  (1.854 m)      Assessment & Plan:

## 2016-02-16 NOTE — Patient Instructions (Signed)
We will check the cholesterol today and call you back about the results.   Good work with the blood pressure so keep up the good work.   We can see you back in 3-6 months.

## 2016-02-17 ENCOUNTER — Ambulatory Visit (INDEPENDENT_AMBULATORY_CARE_PROVIDER_SITE_OTHER): Payer: 59 | Admitting: Nurse Practitioner

## 2016-02-17 ENCOUNTER — Encounter: Payer: Self-pay | Admitting: Nurse Practitioner

## 2016-02-17 VITALS — BP 122/70 | HR 88 | Ht 73.0 in | Wt 189.0 lb

## 2016-02-17 DIAGNOSIS — R112 Nausea with vomiting, unspecified: Secondary | ICD-10-CM | POA: Diagnosis not present

## 2016-02-17 DIAGNOSIS — K223 Perforation of esophagus: Secondary | ICD-10-CM

## 2016-02-17 MED ORDER — ONDANSETRON HCL 4 MG PO TABS: 4.0000 mg | ORAL_TABLET | Freq: Four times a day (QID) | ORAL | 3 refills | Status: DC | PRN

## 2016-02-17 NOTE — Patient Instructions (Signed)
We have sent the following medications to your pharmacy for you to pick up at your convenience: Zofran 4 mg every 6 hrs as needed

## 2016-02-22 NOTE — Progress Notes (Signed)
     HPI: Patient is a 45 year old male admitted chronic intermittent vomiting. 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 He apparently has similar episode in his 7020's.  In early October he presented to ED with upper abdominal pain / chest pain, diaphoresis after vomiting. CTscan revealed pneumomediastinum concerning for esophageal perforation. Gastrograffin showed no extravasation of contrast. Patient smokes marijuana every day but doesn't feel it contributes to the episodic vomiting.   Patient is here for hospital follow up. He feels well. No chest pain/ abdominal pain or other GI complaints.   Past Medical History:  Diagnosis Date  . Esophageal perforation   . Hypertension     Patient's surgical history, family medical history, social history, medications and allergies were all reviewed in Epic    Physical Exam: BP 122/70   Pulse 88   Ht 6\' 1"  (1.854 m)   Wt 189 lb (85.7 kg)   BMI 24.94 kg/m   GENERAL: well developed black male in NAD PSYCH: :Pleasant, cooperative, normal affect HEENT: Normocephalic, conjunctiva pink, mucous membranes moist, neck supple without masses CARDIAC:  RRR, no murmur heard, no peripheral edema PULM: Normal respiratory effort, lungs CTA bilaterally, no wheezing ABDOMEN:  soft, nontender, nondistended, no obvious masses, no hepatomegaly,  normal bowel sounds SKIN:  turgor, no lesions seen Musculoskeletal:  Normal muscle tone, normal strength NEURO: Alert and oriented x 3, no focal neurologic deficits   ASSESSMENT and PLAN:  45 year old male hospitalized in Feb and again in October with recurrent Boerhaave syndrome following vomiting episode. Managed conservatively. He denies hx of significant GERD or eosinophilic esophagitis.  -Recommended he use Zofran at onset of nausea (which occur infrequently). Refill given.  -He continues to smoke Marijuana, doesn't feel it contributes to nausea.  -I don't know if Dr. Lavon PaganiniNandigam  wanted him to have an EGD at some point, it wasn't in our hospital notes but if so then will call him to schedule one.   2.   3.   Willette ClusterPaula Guenther  02/22/2016, 2:18 PM

## 2016-02-24 NOTE — Progress Notes (Signed)
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 I discussed in detail with patient to abstain from marijuana as he's risk for recurrent episode with continued use and cyclic vomiting . If patient is currently asymptomatic, can hold off EGD. Reviewed and agree with documentation and assessment and plan. Iona BeardK. Veena Yurem Viner , MD

## 2016-03-02 ENCOUNTER — Ambulatory Visit: Payer: 59 | Admitting: Internal Medicine

## 2016-06-17 ENCOUNTER — Telehealth: Payer: Self-pay | Admitting: Internal Medicine

## 2016-06-17 MED ORDER — CLONIDINE HCL 0.3 MG PO TABS: 0.3000 mg | ORAL_TABLET | Freq: Two times a day (BID) | ORAL | 3 refills | Status: DC

## 2016-06-17 NOTE — Telephone Encounter (Signed)
Patient states Dr. Okey Dupre has prescribed him clonidine patches but he would like t o go back to pill form b/c he can not afford the patches. Patient uses [facility] on [address].

## 2016-06-17 NOTE — Telephone Encounter (Signed)
Pills printed, no pharmacy in computer.

## 2016-06-20 MED ORDER — CLONIDINE HCL 0.3 MG PO TABS: 0.3000 mg | ORAL_TABLET | Freq: Two times a day (BID) | ORAL | 3 refills | Status: DC

## 2016-06-20 NOTE — Telephone Encounter (Signed)
added pharmacy and sent in

## 2016-08-16 ENCOUNTER — Ambulatory Visit: Payer: 59 | Admitting: Internal Medicine

## 2016-09-05 ENCOUNTER — Other Ambulatory Visit (INDEPENDENT_AMBULATORY_CARE_PROVIDER_SITE_OTHER): Payer: 59

## 2016-09-05 ENCOUNTER — Ambulatory Visit (INDEPENDENT_AMBULATORY_CARE_PROVIDER_SITE_OTHER): Payer: 59 | Admitting: Nurse Practitioner

## 2016-09-05 ENCOUNTER — Encounter: Payer: Self-pay | Admitting: Nurse Practitioner

## 2016-09-05 ENCOUNTER — Ambulatory Visit: Payer: 59 | Admitting: Internal Medicine

## 2016-09-05 VITALS — BP 138/86 | HR 80 | Temp 98.3°F | Ht 73.0 in | Wt 194.0 lb

## 2016-09-05 DIAGNOSIS — I10 Essential (primary) hypertension: Secondary | ICD-10-CM

## 2016-09-05 DIAGNOSIS — L02419 Cutaneous abscess of limb, unspecified: Secondary | ICD-10-CM | POA: Diagnosis not present

## 2016-09-05 LAB — BASIC METABOLIC PANEL
BUN: 15 mg/dL (ref 6–23)
CALCIUM: 9.8 mg/dL (ref 8.4–10.5)
CO2: 31 mEq/L (ref 19–32)
Chloride: 101 mEq/L (ref 96–112)
Creatinine, Ser: 1.08 mg/dL (ref 0.40–1.50)
GFR: 94.12 mL/min (ref >60.00)
GLUCOSE: 107 mg/dL — AB (ref 70–99)
Potassium: 4.8 mEq/L (ref 3.5–5.1)
Sodium: 137 mEq/L (ref 135–145)

## 2016-09-05 MED ORDER — SULFAMETHOXAZOLE-TRIMETHOPRIM 800-160 MG PO TABS: 1.0000 | ORAL_TABLET | Freq: Two times a day (BID) | ORAL | 0 refills | Status: DC

## 2016-09-05 MED ORDER — HYDRALAZINE HCL 25 MG PO TABS: 25.0000 mg | ORAL_TABLET | Freq: Three times a day (TID) | ORAL | 1 refills | Status: DC

## 2016-09-05 MED ORDER — LISINOPRIL-HYDROCHLOROTHIAZIDE 20-25 MG PO TABS: 1.0000 | ORAL_TABLET | Freq: Every day | ORAL | 1 refills | Status: DC

## 2016-09-05 MED ORDER — CHLORHEXIDINE GLUCONATE 4 % EX LIQD: Freq: Every day | CUTANEOUS | 0 refills | Status: DC | PRN

## 2016-09-05 NOTE — Patient Instructions (Signed)
Go to basement for blood draw. Will review lab result prior to send BP medications. Return to office if abscess does not improve in 1week.  Hidradenitis Suppurativa Hidradenitis suppurativa is a long-term (chronic) skin disease that starts with blocked sweat glands or hair follicles. Bacteria may grow in these blocked openings of your skin. Hidradenitis suppurativa is like a severe form of acne that develops in areas of your body where acne would be unusual. It is most likely to affect the areas of your body where skin rubs against skin and becomes moist. This includes your:  Underarms.  Groin.  Genital areas.  Buttocks.  Upper thighs.  Breasts.  Hidradenitis suppurativa may start out with small pimples. The pimples can develop into deep sores that break open (rupture) and drain pus. Over time your skin may thicken and become scarred. Hidradenitis suppurativa cannot be passed from person to person. What are the causes? The exact cause of hidradenitis suppurativa is not known. This condition may be due to:  Male and male hormones. The condition is rare before and after puberty.  An overactive body defense system (immune system). Your immune system may overreact to the blocked hair follicles or sweat glands and cause swelling and pus-filled sores.  What increases the risk? You may have a higher risk of hidradenitis suppurativa if you:  Are a woman.  Are between ages 51 and 32.  Have a family history of hidradenitis suppurativa.  Have a personal history of acne.  Are overweight.  Smoke.  Take the drug lithium.  What are the signs or symptoms? The first signs of an outbreak are usually painful skin bumps that look like pimples. As the condition progresses:  Skin bumps may get bigger and grow deeper into the skin.  Bumps under the skin may rupture and drain smelly pus.  Skin may become itchy and infected.  Skin may thicken and scar.  Drainage may continue through  tunnels under the skin (fistulas).  Walking and moving your arms can become painful.  How is this diagnosed? Your health care provider may diagnose hidradenitis suppurativa based on your medical history and your signs and symptoms. A physical exam will also be done. You may need to see a health care provider who specializes in skin diseases (dermatologist). You may also have tests done to confirm the diagnosis. These can include:  Swabbing a sample of pus or drainage from your skin so it can be sent to the lab and tested for infection.  Blood tests to check for infection.  How is this treated? The same treatment will not work for everybody with hidradenitis suppurativa. Your treatment will depend on how severe your symptoms are. You may need to try several treatments to find what works best for you. Part of your treatment may include cleaning and bandaging (dressing) your wounds. You may also have to take medicines, such as the following:  Antibiotics.  Acne medicines.  Medicines to block or suppress the immune system.  A diabetes medicine (metformin) is sometimes used to treat this condition.  For women, birth control pills can sometimes help relieve symptoms.  You may need surgery if you have a severe case of hidradenitis suppurativa that does not respond to medicine. Surgery may involve:  Using a laser to clear the skin and remove hair follicles.  Opening and draining deep sores.  Removing the areas of skin that are diseased and scarred.  Follow these instructions at home:  Learn as much as you can about  your disease, and work closely with your health care providers.  Take medicines only as directed by your health care provider.  If you were prescribed an antibiotic medicine, finish it all even if you start to feel better.  If you are overweight, losing weight may be very helpful. Try to reach and maintain a healthy weight.  Do not use any tobacco products, including  cigarettes, chewing tobacco, or electronic cigarettes. If you need help quitting, ask your health care provider.  Do not shave the areas where you get hidradenitis suppurativa.  Do not wear deodorant.  Wear loose-fitting clothes.  Try not to overheat and get sweaty.  Take a daily bleach bath as directed by your health care provider. ? Fill your bathtub halfway with water. ? Pour in  cup of unscented household bleach. ? Soak for 5-10 minutes.  Cover sore areas with a warm, clean washcloth (compress) for 5-10 minutes. Contact a health care provider if:  You have a flare-up of hidradenitis suppurativa.  You have chills or a fever.  You are having trouble controlling your symptoms at home. This information is not intended to replace advice given to you by your health care provider. Make sure you discuss any questions you have with your health care provider. Document Released: 10/13/2003 Document Revised: 08/06/2015 Document Reviewed: 05/31/2013 [employer] Interactive Patient Education  2018 [employer]

## 2016-09-05 NOTE — Progress Notes (Signed)
Subjective:  Patient ID: Anthony Atkinson, male    DOB: 26-Jun-1970  Age: 46 y.o. MRN: [id number]  CC: Follow-up (boils? underarms--painful-odor-leaking going on for 3 wks. BP med conuslt?)   Rash  This is a new problem. The current episode started in the past 7 days. The problem is unchanged. The affected locations include the left axilla and right axilla. The rash is characterized by pain and redness. He was exposed to nothing. Pertinent negatives include no facial edema, fatigue, fever, joint pain, shortness of breath or sore throat. Treatments tried: warm compress. The treatment provided mild relief.  with purulent drainage.  HTN: Controlled with hydralazine, clonidine BID, and lisinopril HCTZ.  Outpatient Medications Prior to Visit  Medication Sig Dispense Refill  . cloNIDine (CATAPRES) 0.3 MG tablet Take 1 tablet (0.3 mg total) by mouth 2 (two) times daily. 180 tablet 3  . ondansetron (ZOFRAN) 4 MG tablet Take 1 tablet (4 mg total) by mouth every 6 (six) hours as needed for nausea or vomiting. 30 tablet 3  . hydrALAZINE (APRESOLINE) 25 MG tablet Take 1 tablet (25 mg total) by mouth 3 (three) times daily. (Patient taking differently: Take 25 mg by mouth 3 (three) times daily. Take one by mouth in the morning and one by mouth in the evening.) 270 tablet 3  . lisinopril-hydrochlorothiazide (PRINZIDE,ZESTORETIC) 20-25 MG tablet Take 1 tablet by mouth daily. 90 tablet 3   No facility-administered medications prior to visit.     ROS See HPI  Objective:  BP 138/86   Pulse 80   Temp 98.3 F (36.8 C)   Ht 6\' 1"  (1.854 m)   Wt 194 lb (88 kg)   SpO2 99%   BMI 25.60 kg/m   BP Readings from Last 3 Encounters:  09/05/16 138/86  02/17/16 122/70  02/16/16 132/76    Wt Readings from Last 3 Encounters:  09/05/16 194 lb (88 kg)  02/17/16 189 lb (85.7 kg)  02/16/16 190 lb (86.2 kg)    Physical Exam  Constitutional: He is oriented to person, place, and time. No distress.  Neck:  Normal range of motion. Neck supple.  Cardiovascular: Normal rate, regular rhythm and normal heart sounds.   Pulmonary/Chest: Effort normal and breath sounds normal.  Musculoskeletal: He exhibits no edema.  Lymphadenopathy:    He has no cervical adenopathy.  Neurological: He is alert and oriented to person, place, and time.  Skin: Rash noted. Rash is pustular. There is erythema.     Vitals reviewed.   Lab Results  Component Value Date   WBC 6.8 01/12/2016   HGB 12.8 (L) 01/12/2016   HCT 37.5 (L) 01/12/2016   PLT 291.0 01/12/2016   GLUCOSE 107 (H) 09/05/2016   CHOL 218 (H) 02/16/2016   TRIG 167.0 (H) 02/16/2016   HDL 49.10 02/16/2016   LDLCALC 136 (H) 02/16/2016   ALT 20 01/12/2016   AST 16 01/12/2016   NA 137 09/05/2016   K 4.8 09/05/2016   CL 101 09/05/2016   CREATININE 1.08 09/05/2016   BUN 15 09/05/2016   CO2 31 09/05/2016   TSH 0.939 01/07/2015   INR 1.07 12/14/2015    Ct Chest Without Contrast  Result Date: 12/14/2015 CLINICAL DATA:  Difficulty swallowing beginning this morning. Question esophageal perforation. EXAM: CT CHEST WITHOUT CONTRAST TECHNIQUE: Multidetector CT imaging of the chest was performed following the standard protocol without IV contrast. COMPARISON:  CT chest, abdomen and pelvis and esophagram earlier today. FINDINGS: Cardiovascular: Unremarkable. Mediastinum/Nodes: A small amount of pneumomediastinum  is identified as seen on the CT earlier today. On axial images 133 and 134 contrast penetrates into the anterior wall of the esophagus but there is no extravasation of contrast into the mediastinum or pleural space. The walls of the distal esophagus are thickened. Lungs/Pleura: The lungs are emphysematous but clear. Upper Abdomen: Unremarkable. Musculoskeletal: No focal abnormality. IMPRESSION: Findings concerning for defect in the anterior distal esophagus with contrast seen within the esophageal wall. There may be a microperforation causing  pneumomediastinum but no contrast extravasates into the mediastinum or pleural space. The walls of the distal esophagus appear thickened suggestive of esophagitis. Electronically Signed   By: Drusilla Kannerhomas  Dalessio M.D.   On: 12/14/2015 20:34   Ct Chest W Contrast  Result Date: 12/14/2015 CLINICAL DATA:  Abdominal pain with nausea and vomiting. History of Boerhaave syndrome. EXAM: CT CHEST, ABDOMEN, AND PELVIS WITH CONTRAST TECHNIQUE: Multidetector CT imaging of the chest, abdomen and pelvis was performed following the standard protocol during bolus administration of intravenous contrast. CONTRAST:  100mL ISOVUE-300 IOPAMIDOL (ISOVUE-300) INJECTION 61% COMPARISON:  CT abdomen pelvis 04/23/2015. FINDINGS: CT CHEST FINDINGS Cardiovascular: The heart size is normal. There is no pericardial effusion. There is a normal 3-vessel aortic branching pattern. There is no aortic atherosclerosis. Mediastinum/Nodes: There is pneumomediastinum with air anterior to the esophagus tracking superiorly posterior to the pulmonary artery. Air is also seen anterior to the aortic outflow tract. Thyroid is unremarkable. No mediastinal, hilar or axillary lymphadenopathy. Lungs/Pleura: Multiple posterior biapical cystic spaces. There are multiple nodular subpleural opacities along the left diaphragmatic dome. Lungs are otherwise normal. No pleural effusion. 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 This is unchanged from the prior study of 04/23/2015 No biliary dilatation. The gallbladder is normal. Pancreas: Pancreatic contours are normal. No abnormal calcifications or mass lesions. No peripancreatic fluid collection. No pancreatic ductal dilatation. Spleen: Normal Adrenals/Urinary Tract: Adrenal glands are normal. No hydronephrosis or solid renal mass. Stomach/Bowel: No dilated small bowel or other  evidence of obstruction. No enteric or colonic inflammation. The appendix is normal. Vascular/Lymphatic: No abdominal aortic aneurysm. The inferior vena cava, portal vein, splenic vein and superior mesenteric vein are patent. The aorta, bilateral renal arteries, celiac axis and superior mesenteric artery are patent. The visualized iliac vessels are normal. Mild aortic atherosclerotic calcification. No pelvic or abdominal lymphadenopathy. Reproductive: Prostate is enlarged, measuring 5 cm in transverse dimension. Other: None Musculoskeletal: No bony spinal canal stenosis. No lytic or blastic lesions. IMPRESSION: 1. Small volume pneumomediastinum, with air tracking anterior to the proximal ascending aorta, posterior to the pulmonary arteries and medially along the right mainstem bronchus. The findings likely indicate esophageal rupture. 2. Mild aortic atherosclerosis. 3. Unchanged subpleural nodular opacities of the left of my down. These results will be called to the ordering clinician or representative by the Radiologist Assistant, and communication documented in the PACS or zVision Dashboard. Electronically Signed   By: Deatra RobinsonKevin  Herman M.D.   On: 12/14/2015 14:57   Ct Abdomen Pelvis W Contrast  Result Date: 12/14/2015 CLINICAL DATA:  Abdominal pain with nausea and vomiting. History of Boerhaave syndrome. EXAM: CT CHEST, ABDOMEN, AND PELVIS WITH CONTRAST TECHNIQUE: Multidetector CT imaging of the chest, abdomen and pelvis was performed following the standard protocol during bolus administration of intravenous contrast. CONTRAST:  100mL ISOVUE-300 IOPAMIDOL (ISOVUE-300) INJECTION 61% COMPARISON:  CT abdomen pelvis 04/23/2015. FINDINGS: CT CHEST FINDINGS Cardiovascular: The heart  size is normal. There is no pericardial effusion. There is a normal 3-vessel aortic branching pattern. There is no aortic atherosclerosis. Mediastinum/Nodes: There is pneumomediastinum with air anterior to the esophagus tracking superiorly  posterior to the pulmonary artery. Air is also seen anterior to the aortic outflow tract. Thyroid is unremarkable. No mediastinal, hilar or axillary lymphadenopathy. Lungs/Pleura: Multiple posterior biapical cystic spaces. There are multiple nodular subpleural opacities along the left diaphragmatic dome. Lungs are otherwise normal. No pleural effusion. 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 This is unchanged from the prior study of 04/23/2015 No biliary dilatation. The gallbladder is normal. Pancreas: Pancreatic contours are normal. No abnormal calcifications or mass lesions. No peripancreatic fluid collection. No pancreatic ductal dilatation. Spleen: Normal Adrenals/Urinary Tract: Adrenal glands are normal. No hydronephrosis or solid renal mass. Stomach/Bowel: No dilated small bowel or other evidence of obstruction. No enteric or colonic inflammation. The appendix is normal. Vascular/Lymphatic: No abdominal aortic aneurysm. The inferior vena cava, portal vein, splenic vein and superior mesenteric vein are patent. The aorta, bilateral renal arteries, celiac axis and superior mesenteric artery are patent. The visualized iliac vessels are normal. Mild aortic atherosclerotic calcification. No pelvic or abdominal lymphadenopathy. Reproductive: Prostate is enlarged, measuring 5 cm in transverse dimension. Other: None Musculoskeletal: No bony spinal canal stenosis. No lytic or blastic lesions. IMPRESSION: 1. Small volume pneumomediastinum, with air tracking anterior to the proximal ascending aorta, posterior to the pulmonary arteries and medially along the right mainstem bronchus. The findings likely indicate esophageal rupture. 2. Mild aortic atherosclerosis. 3. Unchanged subpleural nodular opacities of the left of my down. These results will be called to the ordering clinician or  representative by the Radiologist Assistant, and communication documented in the PACS or zVision Dashboard. Electronically Signed   By: Deatra Robinson M.D.   On: 12/14/2015 14:57   Dg Chest Port 1 View  Result Date: 12/15/2015 CLINICAL DATA:  46 year old male with dyspnea and cough. Subsequent encounter. EXAM: PORTABLE CHEST 1 VIEW COMPARISON:  12/14/2015 CT and chest x-ray. FINDINGS: CT detected pneumomediastinum not appreciated by plain film exam. Lungs clear. Heart size within normal limits. IMPRESSION: CT detected pneumomediastinum not appreciated by plain film exam. Lungs clear. Electronically Signed   By: Lacy Duverney M.D.   On: 12/15/2015 07:45   Dg Abdomen Acute W/chest  Result Date: 12/14/2015 CLINICAL DATA:  Abdominal pain. EXAM: DG ABDOMEN ACUTE W/ 1V CHEST COMPARISON:  04/24/2015.  CT 04/23/2015. FINDINGS: Soft tissue structures are unremarkable. No acute cardiopulmonary disease. No acute intra-abdominal disease. No bowel distention. Prominent amount of stool noted throughout the colon. No free air. Pelvic calcifications consistent phleboliths. Leads noted over the abdomen in chest. IMPRESSION: Number No acute cardiopulmonary disease. 2. No acute intra-abdominal abnormality. Prominent amount of stool noted throughout colon. Constipation cannot be excluded. Electronically Signed   By: Maisie Fus  Register   On: 12/14/2015 12:45   Dg Esophagus W/water Marchelle Folks Cm  Result Date: 12/14/2015 CLINICAL DATA:  46 year old male with pneumomediastinum seen on the earlier CT. EXAM: ESOPHOGRAM/BARIUM SWALLOW TECHNIQUE: Single contrast examination was performed using  Isovue-300. FLUOROSCOPY TIME:  Fluoroscopy Time:  48 seconds Number of Acquired Spot Images: 7 COMPARISON:  CT dated 12/14/2015 FINDINGS: Evaluation is limited as the patient could not tolerate this study and could not swallow adequate amount of contrast. Scout radiograph demonstrates small amount of pneumomediastinum. The cardiac silhouette is within  normal  limits. The aorta is slightly tortuous. The visualized lungs are clear. Oral contrast noted throughout the esophagus entering the stomach. No extraluminal contrast or extension of contrast outside of the esophageal contour within the mediastinum identified. Gastroesophageal reflux noted during coughing. Patient subsequently vomited. IMPRESSION: No definite extraluminal contrast identified to suggest leak. Electronically Signed   By: Elgie Collard M.D.   On: 12/14/2015 19:32    Assessment & Plan:   Anthony Atkinson was seen today for follow-up.  Diagnoses and all orders for this visit:  Abscess of axilla -     sulfamethoxazole-trimethoprim (BACTRIM DS,SEPTRA DS) 800-160 MG tablet; Take 1 tablet by mouth 2 (two) times daily. -     chlorhexidine (HIBICLENS) 4 % external liquid; Apply topically daily as needed.  HTN (hypertension), benign -     Basic metabolic panel; Future -     hydrALAZINE (APRESOLINE) 25 MG tablet; Take 1 tablet (25 mg total) by mouth 3 (three) times daily. -     lisinopril-hydrochlorothiazide (PRINZIDE,ZESTORETIC) 20-25 MG tablet; Take 1 tablet by mouth daily.   I am having Anthony Atkinson start on sulfamethoxazole-trimethoprim and chlorhexidine. I am also having him maintain his ondansetron, cloNIDine, hydrALAZINE, and lisinopril-hydrochlorothiazide.  Meds ordered this encounter  Medications  . sulfamethoxazole-trimethoprim (BACTRIM DS,SEPTRA DS) 800-160 MG tablet    Sig: Take 1 tablet by mouth 2 (two) times daily.    Dispense:  20 tablet    Refill:  0    Order Specific Question:   Supervising Provider    Answer:   Tresa Garter [[id number]]  . chlorhexidine (HIBICLENS) 4 % external liquid    Sig: Apply topically daily as needed.    Dispense:  120 mL    Refill:  0    Order Specific Question:   Supervising Provider    Answer:   Tresa Garter [[id number]]  . hydrALAZINE (APRESOLINE) 25 MG tablet    Sig: Take 1 tablet (25 mg total) by mouth 3 (three) times daily.     Dispense:  270 tablet    Refill:  1    Order Specific Question:   Supervising Provider    Answer:   Tresa Garter [[id number]]  . lisinopril-hydrochlorothiazide (PRINZIDE,ZESTORETIC) 20-25 MG tablet    Sig: Take 1 tablet by mouth daily.    Dispense:  90 tablet    Refill:  1    Order Specific Question:   Supervising Provider    Answer:   Tresa Garter [[id number]]    Follow-up: Return in about 6 months (around 03/07/2017) for HTN with Dr. Okey Dupre.  Alysia Penna, NP

## 2016-09-19 ENCOUNTER — Encounter: Payer: Self-pay | Admitting: Nurse Practitioner

## 2016-09-19 ENCOUNTER — Ambulatory Visit (INDEPENDENT_AMBULATORY_CARE_PROVIDER_SITE_OTHER): Payer: 59 | Admitting: Nurse Practitioner

## 2016-09-19 VITALS — BP 132/82 | HR 61 | Temp 98.5°F | Ht 73.0 in | Wt 194.0 lb

## 2016-09-19 DIAGNOSIS — L02419 Cutaneous abscess of limb, unspecified: Secondary | ICD-10-CM

## 2016-09-19 DIAGNOSIS — L732 Hidradenitis suppurativa: Secondary | ICD-10-CM | POA: Diagnosis not present

## 2016-09-19 MED ORDER — SACCHAROMYCES BOULARDII 250 MG PO CAPS: 250.0000 mg | ORAL_CAPSULE | Freq: Two times a day (BID) | ORAL | 0 refills | Status: DC

## 2016-09-19 MED ORDER — CEPHALEXIN 500 MG PO CAPS: 500.0000 mg | ORAL_CAPSULE | Freq: Three times a day (TID) | ORAL | 0 refills | Status: DC

## 2016-09-19 MED ORDER — DOXYCYCLINE HYCLATE 100 MG PO TABS: 100.0000 mg | ORAL_TABLET | Freq: Two times a day (BID) | ORAL | 0 refills | Status: DC

## 2016-09-19 NOTE — Patient Instructions (Signed)
Continue with warm compress and use of chlorhexidine soap. You will be contacted to schedule appt with general surgery.  Hidradenitis Suppurativa Hidradenitis suppurativa is a long-term (chronic) skin disease that starts with blocked sweat glands or hair follicles. Bacteria may grow in these blocked openings of your skin. Hidradenitis suppurativa is like a severe form of acne that develops in areas of your body where acne would be unusual. It is most likely to affect the areas of your body where skin rubs against skin and becomes moist. This includes your:  Underarms.  Groin.  Genital areas.  Buttocks.  Upper thighs.  Breasts.  Hidradenitis suppurativa may start out with small pimples. The pimples can develop into deep sores that break open (rupture) and drain pus. Over time your skin may thicken and become scarred. Hidradenitis suppurativa cannot be passed from person to person. What are the causes? The exact cause of hidradenitis suppurativa is not known. This condition may be due to:  Male and male hormones. The condition is rare before and after puberty.  An overactive body defense system (immune system). Your immune system may overreact to the blocked hair follicles or sweat glands and cause swelling and pus-filled sores.  What increases the risk? You may have a higher risk of hidradenitis suppurativa if you:  Are a woman.  Are between ages 2711 and 8155.  Have a family history of hidradenitis suppurativa.  Have a personal history of acne.  Are overweight.  Smoke.  Take the drug lithium.  What are the signs or symptoms? The first signs of an outbreak are usually painful skin bumps that look like pimples. As the condition progresses:  Skin bumps may get bigger and grow deeper into the skin.  Bumps under the skin may rupture and drain smelly pus.  Skin may become itchy and infected.  Skin may thicken and scar.  Drainage may continue through tunnels under the  skin (fistulas).  Walking and moving your arms can become painful.  How is this diagnosed? Your health care provider may diagnose hidradenitis suppurativa based on your medical history and your signs and symptoms. A physical exam will also be done. You may need to see a health care provider who specializes in skin diseases (dermatologist). You may also have tests done to confirm the diagnosis. These can include:  Swabbing a sample of pus or drainage from your skin so it can be sent to the lab and tested for infection.  Blood tests to check for infection.  How is this treated? The same treatment will not work for everybody with hidradenitis suppurativa. Your treatment will depend on how severe your symptoms are. You may need to try several treatments to find what works best for you. Part of your treatment may include cleaning and bandaging (dressing) your wounds. You may also have to take medicines, such as the following:  Antibiotics.  Acne medicines.  Medicines to block or suppress the immune system.  A diabetes medicine (metformin) is sometimes used to treat this condition.  For women, birth control pills can sometimes help relieve symptoms.  You may need surgery if you have a severe case of hidradenitis suppurativa that does not respond to medicine. Surgery may involve:  Using a laser to clear the skin and remove hair follicles.  Opening and draining deep sores.  Removing the areas of skin that are diseased and scarred.  Follow these instructions at home:  Learn as much as you can about your disease, and work closely with  your health care providers.  Take medicines only as directed by your health care provider.  If you were prescribed an antibiotic medicine, finish it all even if you start to feel better.  If you are overweight, losing weight may be very helpful. Try to reach and maintain a healthy weight.  Do not use any tobacco products, including cigarettes, chewing  tobacco, or electronic cigarettes. If you need help quitting, ask your health care provider.  Do not shave the areas where you get hidradenitis suppurativa.  Do not wear deodorant.  Wear loose-fitting clothes.  Try not to overheat and get sweaty.  Take a daily bleach bath as directed by your health care provider. ? Fill your bathtub halfway with water. ? Pour in  cup of unscented household bleach. ? Soak for 5-10 minutes.  Cover sore areas with a warm, clean washcloth (compress) for 5-10 minutes. Contact a health care provider if:  You have a flare-up of hidradenitis suppurativa.  You have chills or a fever.  You are having trouble controlling your symptoms at home. This information is not intended to replace advice given to you by your health care provider. Make sure you discuss any questions you have with your health care provider. Document Released: 10/13/2003 Document Revised: 08/06/2015 Document Reviewed: 05/31/2013 Elsevier Interactive Patient Education  2018 [employer].

## 2016-09-19 NOTE — Progress Notes (Signed)
Subjective:  Patient ID: Anthony Atkinson, male    DOB: 06-06-1970  Age: 46 y.o. MRN: [id number]  CC: Follow-up (follow up cyst undrearm area, not going away but not worse. )   HPI Axilla Abscess: Presents with persistent bilateral axilla tenderness and drainage x 3weeks. Had some improvement with oral abx (completed bactrim x 10days), warm compress and antiseptic cleanse.  Outpatient Medications Prior to Visit  Medication Sig Dispense Refill  . chlorhexidine (HIBICLENS) 4 % external liquid Apply topically daily as needed. 120 mL 0  . cloNIDine (CATAPRES) 0.3 MG tablet Take 1 tablet (0.3 mg total) by mouth 2 (two) times daily. 180 tablet 3  . hydrALAZINE (APRESOLINE) 25 MG tablet Take 1 tablet (25 mg total) by mouth 3 (three) times daily. 270 tablet 1  . lisinopril-hydrochlorothiazide (PRINZIDE,ZESTORETIC) 20-25 MG tablet Take 1 tablet by mouth daily. 90 tablet 1  . ondansetron (ZOFRAN) 4 MG tablet Take 1 tablet (4 mg total) by mouth every 6 (six) hours as needed for nausea or vomiting. 30 tablet 3  . sulfamethoxazole-trimethoprim (BACTRIM DS,SEPTRA DS) 800-160 MG tablet Take 1 tablet by mouth 2 (two) times daily. 20 tablet 0   No facility-administered medications prior to visit.     ROS Review of Systems  Constitutional: Negative for chills, fever and malaise/fatigue.  HENT: Negative for sore throat.   Cardiovascular: Negative for chest pain.  Musculoskeletal: Negative for joint pain and myalgias.  Neurological: Negative for sensory change.     Objective:  BP 132/82   Pulse 61   Temp 98.5 F (36.9 C)   Ht 6\' 1"  (1.854 m)   Wt 194 lb (88 kg)   SpO2 98%   BMI 25.60 kg/m   BP Readings from Last 3 Encounters:  09/19/16 132/82  09/05/16 138/86  02/17/16 122/70    Wt Readings from Last 3 Encounters:  09/19/16 194 lb (88 kg)  09/05/16 194 lb (88 kg)  02/17/16 189 lb (85.7 kg)    Physical Exam  Constitutional: He is oriented to person, place, and time.    Neurological: He is alert and oriented to person, place, and time.  Skin: Rash noted. Rash is pustular. There is erythema.  Cluster of pustules in Bilateral axilla region. No joint effusion or erythema. Normal distal pulses.  He declined I&D in office due to fear of pain.    Lab Results  Component Value Date   WBC 6.8 01/12/2016   HGB 12.8 (L) 01/12/2016   HCT 37.5 (L) 01/12/2016   PLT 291.0 01/12/2016   GLUCOSE 107 (H) 09/05/2016   CHOL 218 (H) 02/16/2016   TRIG 167.0 (H) 02/16/2016   HDL 49.10 02/16/2016   LDLCALC 136 (H) 02/16/2016   ALT 20 01/12/2016   AST 16 01/12/2016   NA 137 09/05/2016   K 4.8 09/05/2016   CL 101 09/05/2016   CREATININE 1.08 09/05/2016   BUN 15 09/05/2016   CO2 31 09/05/2016   TSH 0.939 01/07/2015   INR 1.07 12/14/2015    Ct Chest Without Contrast  Result Date: 12/14/2015 CLINICAL DATA:  Difficulty swallowing beginning this morning. Question esophageal perforation. EXAM: CT CHEST WITHOUT CONTRAST TECHNIQUE: Multidetector CT imaging of the chest was performed following the standard protocol without IV contrast. COMPARISON:  CT chest, abdomen and pelvis and esophagram earlier today. FINDINGS: Cardiovascular: Unremarkable. Mediastinum/Nodes: A small amount of pneumomediastinum is identified as seen on the CT earlier today. On axial images 133 and 134 contrast penetrates into the anterior wall of the esophagus  but there is no extravasation of contrast into the mediastinum or pleural space. The walls of the distal esophagus are thickened. Lungs/Pleura: The lungs are emphysematous but clear. Upper Abdomen: Unremarkable. Musculoskeletal: No focal abnormality. IMPRESSION: Findings concerning for defect in the anterior distal esophagus with contrast seen within the esophageal wall. There may be a microperforation causing pneumomediastinum but no contrast extravasates into the mediastinum or pleural space. The walls of the distal esophagus appear thickened  suggestive of esophagitis. Electronically Signed   By: Drusilla Kanner M.D.   On: 12/14/2015 20:34   Ct Chest W Contrast  Result Date: 12/14/2015 CLINICAL DATA:  Abdominal pain with nausea and vomiting. History of Boerhaave syndrome. EXAM: CT CHEST, ABDOMEN, AND PELVIS WITH CONTRAST TECHNIQUE: Multidetector CT imaging of the chest, abdomen and pelvis was performed following the standard protocol during bolus administration of intravenous contrast. CONTRAST:  ISOVUE-300 IOPAMIDOL (ISOVUE-300) INJECTION 61% COMPARISON:  CT abdomen pelvis 04/23/2015. FINDINGS: CT CHEST FINDINGS Cardiovascular: The heart size is normal. There is no pericardial effusion. There is a normal 3-vessel aortic branching pattern. There is no aortic atherosclerosis. Mediastinum/Nodes: There is pneumomediastinum with air anterior to the esophagus tracking superiorly posterior to the pulmonary artery. Air is also seen anterior to the aortic outflow tract. Thyroid is unremarkable. No mediastinal, hilar or axillary lymphadenopathy. Lungs/Pleura: Multiple posterior biapical cystic spaces. There are multiple nodular subpleural opacities along the left diaphragmatic dome. Lungs are otherwise normal. No pleural effusion. 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 This is unchanged from the prior study of 04/23/2015 No biliary dilatation. The gallbladder is normal. Pancreas: Pancreatic contours are normal. No abnormal calcifications or mass lesions. No peripancreatic fluid collection. No pancreatic ductal dilatation. Spleen: Normal Adrenals/Urinary Tract: Adrenal glands are normal. No hydronephrosis or solid renal mass. Stomach/Bowel: No dilated small bowel or other evidence of obstruction. No enteric or colonic inflammation. The appendix is normal. Vascular/Lymphatic: No abdominal aortic aneurysm. The  inferior vena cava, portal vein, splenic vein and superior mesenteric vein are patent. The aorta, bilateral renal arteries, celiac axis and superior mesenteric artery are patent. The visualized iliac vessels are normal. Mild aortic atherosclerotic calcification. No pelvic or abdominal lymphadenopathy. Reproductive: Prostate is enlarged, measuring 5 cm in transverse dimension. Other: None Musculoskeletal: No bony spinal canal stenosis. No lytic or blastic lesions. IMPRESSION: 1. Small volume pneumomediastinum, with air tracking anterior to the proximal ascending aorta, posterior to the pulmonary arteries and medially along the right mainstem bronchus. The findings likely indicate esophageal rupture. 2. Mild aortic atherosclerosis. 3. Unchanged subpleural nodular opacities of the left of my down. These results will be called to the ordering clinician or representative by the Radiologist Assistant, and communication documented in the PACS or zVision Dashboard. Electronically Signed   By: Deatra Robinson M.D.   On: 12/14/2015 14:57   Ct Abdomen Pelvis W Contrast  Result Date: 12/14/2015 CLINICAL DATA:  Abdominal pain with nausea and vomiting. History of Boerhaave syndrome. EXAM: CT CHEST, ABDOMEN, AND PELVIS WITH CONTRAST TECHNIQUE: Multidetector CT imaging of the chest, abdomen and pelvis was performed following the standard protocol during bolus administration of intravenous contrast. CONTRAST:  ISOVUE-300 IOPAMIDOL (ISOVUE-300) INJECTION 61% COMPARISON:  CT abdomen pelvis 04/23/2015. FINDINGS: CT CHEST FINDINGS Cardiovascular: The heart size is normal. There is no pericardial effusion. There is a normal 3-vessel aortic branching pattern. There is no aortic atherosclerosis. Mediastinum/Nodes: There is  pneumomediastinum with air anterior to the esophagus tracking superiorly posterior to the pulmonary artery. Air is also seen anterior to the aortic outflow tract. Thyroid is unremarkable. No mediastinal, hilar or  axillary lymphadenopathy. Lungs/Pleura: Multiple posterior biapical cystic spaces. There are multiple nodular subpleural opacities along the left diaphragmatic dome. Lungs are otherwise normal. No pleural effusion. 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 This is unchanged from the prior study of 04/23/2015 No biliary dilatation. The gallbladder is normal. Pancreas: Pancreatic contours are normal. No abnormal calcifications or mass lesions. No peripancreatic fluid collection. No pancreatic ductal dilatation. Spleen: Normal Adrenals/Urinary Tract: Adrenal glands are normal. No hydronephrosis or solid renal mass. Stomach/Bowel: No dilated small bowel or other evidence of obstruction. No enteric or colonic inflammation. The appendix is normal. Vascular/Lymphatic: No abdominal aortic aneurysm. The inferior vena cava, portal vein, splenic vein and superior mesenteric vein are patent. The aorta, bilateral renal arteries, celiac axis and superior mesenteric artery are patent. The visualized iliac vessels are normal. Mild aortic atherosclerotic calcification. No pelvic or abdominal lymphadenopathy. Reproductive: Prostate is enlarged, measuring 5 cm in transverse dimension. Other: None Musculoskeletal: No bony spinal canal stenosis. No lytic or blastic lesions. IMPRESSION: 1. Small volume pneumomediastinum, with air tracking anterior to the proximal ascending aorta, posterior to the pulmonary arteries and medially along the right mainstem bronchus. The findings likely indicate esophageal rupture. 2. Mild aortic atherosclerosis. 3. Unchanged subpleural nodular opacities of the left of my down. These results will be called to the ordering clinician or representative by the Radiologist Assistant, and communication documented in the PACS or zVision Dashboard. Electronically Signed   By:  Deatra Robinson M.D.   On: 12/14/2015 14:57   Dg Chest Port 1 View  Result Date: 12/15/2015 CLINICAL DATA:  46 year old male with dyspnea and cough. Subsequent encounter. EXAM: PORTABLE CHEST 1 VIEW COMPARISON:  12/14/2015 CT and chest x-ray. FINDINGS: CT detected pneumomediastinum not appreciated by plain film exam. Lungs clear. Heart size within normal limits. IMPRESSION: CT detected pneumomediastinum not appreciated by plain film exam. Lungs clear. Electronically Signed   By: Lacy Duverney M.D.   On: 12/15/2015 07:45   Dg Abdomen Acute W/chest  Result Date: 12/14/2015 CLINICAL DATA:  Abdominal pain. EXAM: DG ABDOMEN ACUTE W/ 1V CHEST COMPARISON:  04/24/2015.  CT 04/23/2015. FINDINGS: Soft tissue structures are unremarkable. No acute cardiopulmonary disease. No acute intra-abdominal disease. No bowel distention. Prominent amount of stool noted throughout the colon. No free air. Pelvic calcifications consistent phleboliths. Leads noted over the abdomen in chest. IMPRESSION: Number No acute cardiopulmonary disease. 2. No acute intra-abdominal abnormality. Prominent amount of stool noted throughout colon. Constipation cannot be excluded. Electronically Signed   By: Maisie Fus  Register   On: 12/14/2015 12:45   Dg Esophagus W/water Marchelle Folks Cm  Result Date: 12/14/2015 CLINICAL DATA:  46 year old male with pneumomediastinum seen on the earlier CT. EXAM: ESOPHOGRAM/BARIUM SWALLOW TECHNIQUE: Single contrast examination was performed using  Isovue-300. FLUOROSCOPY TIME:  Fluoroscopy Time:  48 seconds Number of Acquired Spot Images: 7 COMPARISON:  CT dated 12/14/2015 FINDINGS: Evaluation is limited as the patient could not tolerate this study and could not swallow adequate amount of contrast. Scout radiograph demonstrates small amount of pneumomediastinum. The cardiac silhouette is within normal limits. The aorta is slightly tortuous. The visualized lungs are clear. Oral contrast noted throughout the esophagus entering  the stomach. No extraluminal contrast or  extension of contrast outside of the esophageal contour within the mediastinum identified. Gastroesophageal reflux noted during coughing. Patient subsequently vomited. IMPRESSION: No definite extraluminal contrast identified to suggest leak. Electronically Signed   By: Elgie CollardArash  Radparvar M.D.   On: 12/14/2015 19:32    Assessment & Plan:   Arlys JohnBrian was seen today for follow-up.  Diagnoses and all orders for this visit:  Hidradenitis suppurativa -     Discontinue: cephALEXin (KEFLEX) 500 MG capsule; Take 1 capsule (500 mg total) by mouth 3 (three) times daily. -     Ambulatory referral to General Surgery -     saccharomyces boulardii (FLORASTOR) 250 MG capsule; Take 1 capsule (250 mg total) by mouth 2 (two) times daily. -     doxycycline (VIBRA-TABS) 100 MG tablet; Take 1 tablet (100 mg total) by mouth 2 (two) times daily.  Abscess of axilla -     Discontinue: cephALEXin (KEFLEX) 500 MG capsule; Take 1 capsule (500 mg total) by mouth 3 (three) times daily. -     Ambulatory referral to General Surgery -     saccharomyces boulardii (FLORASTOR) 250 MG capsule; Take 1 capsule (250 mg total) by mouth 2 (two) times daily. -     doxycycline (VIBRA-TABS) 100 MG tablet; Take 1 tablet (100 mg total) by mouth 2 (two) times daily.   I have discontinued Mr. Murdoch's sulfamethoxazole-trimethoprim and cephALEXin. I am also having him start on saccharomyces boulardii and doxycycline. Additionally, I am having him maintain his ondansetron, cloNIDine, chlorhexidine, hydrALAZINE, and lisinopril-hydrochlorothiazide.  Meds ordered this encounter  Medications  . DISCONTD: cephALEXin (KEFLEX) 500 MG capsule    Sig: Take 1 capsule (500 mg total) by mouth 3 (three) times daily.    Dispense:  21 capsule    Refill:  0    Order Specific Question:   Supervising Provider    Answer:   Tresa GarterPLOTNIKOV, ALEKSEI V [[id number]]  . saccharomyces boulardii (FLORASTOR) 250 MG capsule    Sig:  Take 1 capsule (250 mg total) by mouth 2 (two) times daily.    Dispense:  30 capsule    Refill:  0    Order Specific Question:   Supervising Provider    Answer:   Tresa GarterPLOTNIKOV, ALEKSEI V [[id number]]  . doxycycline (VIBRA-TABS) 100 MG tablet    Sig: Take 1 tablet (100 mg total) by mouth 2 (two) times daily.    Dispense:  14 tablet    Refill:  0    Do not fill cephalexin    Order Specific Question:   Supervising Provider    Answer:   Tresa GarterPLOTNIKOV, ALEKSEI V [[id number]]    Follow-up: Return if symptoms worsen or fail to improve.  Alysia Pennaharlotte Hopelynn Gartland, NP

## 2016-09-30 ENCOUNTER — Other Ambulatory Visit: Payer: Self-pay | Admitting: Surgery

## 2016-09-30 DIAGNOSIS — L732 Hidradenitis suppurativa: Secondary | ICD-10-CM | POA: Diagnosis not present

## 2016-10-21 ENCOUNTER — Other Ambulatory Visit (HOSPITAL_COMMUNITY): Payer: Self-pay

## 2016-10-21 NOTE — Pre-Procedure Instructions (Signed)
Anthony Atkinson  10/21/2016      [facility] - [place], [place] - [address] [place] [address] Phone: [phone] Fax: [phone]  [facility] [address] [place] - [id number] [place] [address] Phone: [phone] Fax: [phone]    Your procedure is scheduled on Thursday October 27, 2016  [facility]" at 10:00 A.M.  Call this number if you have problems the morning of surgery:  [phone]   Remember:  Do not eat food or drink liquids after midnight October 26, 2016  Take these medicines the morning of surgery with A SIP OF WATER: CloNIDine (CATAPRES) and HydrALAZINE (APRESOLINE). If needed Ondansetron [facility](ZOFRAN) for nausea.  7 days before surgery stop taking all Aspirins, Vitamins, Fish oils, and Herbal medications. Also stop all NSAIDS i.e. Advil, Motrin, Aleve, Anaprox, Naproxen, BC and Goody Powders.   Do not wear jewelry.  Do not wear lotions, powders, or perfumes, or deoderant.  Do not shave 48 hours prior to surgery.  Men may shave face and neck.  Do not bring valuables to the hospital.  [facility] is not responsible for any belongings or valuables.  Contacts, dentures or bridgework may not be worn into surgery.  Leave your suitcase in the car.  After surgery it may be brought to your room.  For patients admitted to the hospital, discharge time will be determined by your treatment team.  Patients discharged the day of surgery will not be allowed to drive home.   Special instructions:   [facility]- Preparing For Surgery  Before surgery, you can play an important role. Because skin is not sterile, your skin needs to be as free of germs as possible. You can reduce the number of germs on your skin by washing with CHG (chlorahexidine gluconate) Soap before surgery.  CHG is an antiseptic cleaner which kills germs and  bonds with the skin to continue killing germs even after washing.  Please do not use if you have an allergy to CHG or antibacterial soaps. If your skin becomes reddened/irritated stop using the CHG.  Do not shave (including legs and underarms) for at least 48 hours prior to first CHG shower. It is OK to shave your face.  Please follow these instructions carefully.   1. Shower the NIGHT BEFORE SURGERY and the MORNING OF SURGERY with CHG.   2. If you chose to wash your hair, wash your hair first as usual with your normal shampoo.  3. After you shampoo, rinse your hair and body thoroughly to remove the shampoo.  4. Use CHG as you would any other liquid soap. You can apply CHG directly to the skin and wash gently with a scrungie or a clean washcloth.   5. Apply the CHG Soap to your body ONLY FROM THE NECK DOWN.  Do not use on open wounds or open sores. Avoid contact with your eyes, ears, mouth and genitals (private parts). Wash genitals (private parts) with your normal soap.  6. Wash thoroughly, paying special attention to the area where your surgery will be performed.  7. Thoroughly rinse your body with warm water from the neck down.  8. DO NOT shower/wash with your normal soap after using and rinsing off the CHG Soap.  9. Pat yourself dry with a CLEAN TOWEL.   10. Wear CLEAN PAJAMAS   11. Place CLEAN SHEETS on your bed the night of  your first shower and DO NOT SLEEP WITH PETS.  Day of Surgery: Do not apply any deodorants/lotions. Please wear clean clothes to the hospital/surgery center.    Please read over the following fact sheets that you were given. Pain Booklet, Coughing and Deep Breathing and Surgical Site Infection Prevention

## 2016-10-24 ENCOUNTER — Encounter (HOSPITAL_COMMUNITY)
Admission: RE | Admit: 2016-10-24 | Discharge: 2016-10-24 | Disposition: A | Discharge status: Home / Self Care | Payer: 59 | Source: Ambulatory Visit | Attending: Surgery | Admitting: Surgery

## 2016-10-24 ENCOUNTER — Encounter (HOSPITAL_COMMUNITY): Payer: Self-pay

## 2016-10-24 DIAGNOSIS — I110 Hypertensive heart disease with heart failure: Secondary | ICD-10-CM | POA: Diagnosis not present

## 2016-10-24 DIAGNOSIS — Z01818 Encounter for other preprocedural examination: Secondary | ICD-10-CM

## 2016-10-24 DIAGNOSIS — Z01812 Encounter for preprocedural laboratory examination: Secondary | ICD-10-CM

## 2016-10-24 DIAGNOSIS — I440 Atrioventricular block, first degree: Secondary | ICD-10-CM

## 2016-10-24 DIAGNOSIS — L732 Hidradenitis suppurativa: Secondary | ICD-10-CM

## 2016-10-24 DIAGNOSIS — Z87891 Personal history of nicotine dependence: Secondary | ICD-10-CM | POA: Diagnosis not present

## 2016-10-24 DIAGNOSIS — Z79899 Other long term (current) drug therapy: Secondary | ICD-10-CM | POA: Diagnosis not present

## 2016-10-24 DIAGNOSIS — J449 Chronic obstructive pulmonary disease, unspecified: Secondary | ICD-10-CM | POA: Diagnosis not present

## 2016-10-24 DIAGNOSIS — I509 Heart failure, unspecified: Secondary | ICD-10-CM | POA: Diagnosis not present

## 2016-10-24 HISTORY — DX: Hidradenitis suppurativa: L73.2

## 2016-10-24 LAB — BASIC METABOLIC PANEL
ANION GAP: 12 (ref 5–15)
BUN: 12 mg/dL (ref 6–20)
CALCIUM: 9.4 mg/dL (ref 8.9–10.3)
CO2: 23 mmol/L (ref 22–32)
Chloride: 99 mmol/L — ABNORMAL LOW (ref 101–111)
Creatinine, Ser: 1.11 mg/dL (ref 0.61–1.24)
GLUCOSE: 106 mg/dL — AB (ref 65–99)
Potassium: 3.9 mmol/L (ref 3.5–5.1)
SODIUM: 134 mmol/L — AB (ref 135–145)

## 2016-10-24 LAB — CBC
HCT: 34 % — ABNORMAL LOW (ref 39.0–52.0)
HEMOGLOBIN: 11.6 g/dL — AB (ref 13.0–17.0)
MCH: 32.4 pg (ref 26.0–34.0)
MCHC: 34.1 g/dL (ref 30.0–36.0)
MCV: 95 fL (ref 78.0–100.0)
Platelets: 362 10*3/uL (ref 150–400)
RBC: 3.58 MIL/uL — AB (ref 4.22–5.81)
RDW: 13.7 % (ref 11.5–15.5)
WBC: 9.7 10*3/uL (ref 4.0–10.5)

## 2016-10-24 NOTE — Progress Notes (Addendum)
PCP - Dr. [place]  Cardiologist - Denies  Chest x-ray -12/15/15 (E)  EKG - 10/24/16; Previous EKG from 12/15/15 (E) abnormal   Stress Test - Denies  ECHO - Denies  Cardiac Cath - Denies  Sleep Study - Denies CPAP - None  Chart will be sent to anesthesia for review due to an abnormal EKG.  Pt denies having chest pain, sob, or fever at this time. All instructions explained to the pt, with a verbal understanding of the material. Pt agrees to go over the instructions while at home for a better understanding. The opportunity to ask questions was provided.

## 2016-10-26 MED ORDER — CEFAZOLIN SODIUM-DEXTROSE 2-4 GM/100ML-% IV SOLN
2.0000 g | INTRAVENOUS | Status: AC
  Administered 2016-10-27: 2 g via INTRAVENOUS
  Filled 2016-10-26: qty 100

## 2016-10-26 NOTE — H&P (Signed)
Anthony RacerBrian K Atkinson 09/30/2016 9:56 AM Location: [facility] Patient #: [id number] DOB: 01/03/1971 Married / Language: English / Race: Black or African American Male   History of Present Illness Anthony Atkinson(Anthony Jarosz A. Magnus IvanBlackman MD; 09/30/2016 10:14 AM) Patient words: New-Hidradenitis.  The patient is a 46 year old male who presents with a complaint of Hidradenitis. This patient is referred by Alysia Pennaharlotte Nche NP for evaluation of bilateral axillary hidradenitis. The patient reports he has had hidradenitis on his face in the past and has had surgery on this but he started getting it in the axilla over 3 months ago. He has been on multiple antibiotics without improvement. He reports chronic pain and drainage from multiple draining sinus tracts in both axilla. He has not developed in his groins. He is otherwise without complaints.   Past Surgical History Doristine Devoid(Chemira Jones, CMA; 09/30/2016 9:57 AM) No pertinent past surgical history   Diagnostic Studies History Doristine Devoid(Chemira Jones, CMA; 09/30/2016 9:57 AM) Colonoscopy  never  Allergies Doristine Devoid(Chemira Jones, CMA; 09/30/2016 9:58 AM) No Known Drug Allergies 09/30/2016  Medication History Doristine Devoid(Chemira Jones, CMA; 09/30/2016 9:58 AM) CloNIDine HCl (0.3MG  Tablet, Oral) Active. HydrALAZINE HCl (25MG  Tablet, Oral) Active. Lisinopril-Hydrochlorothiazide (20-25MG  Tablet, Oral) Active. Medications Reconciled  Social History Doristine Devoid(Chemira Jones, CMA; 09/30/2016 9:57 AM) Alcohol use  Occasional alcohol use. Caffeine use  Coffee. Tobacco use  Former smoker.    Review of Systems Doristine Devoid(Chemira Jones CMA; 09/30/2016 9:57 AM) General Present- Appetite Loss. Not Present- Chills, Fatigue, Fever, Night Sweats, Weight Gain and Weight Loss. Skin Present- New Lesions. Not Present- Change in Wart/Mole, Dryness, Hives, Jaundice, Non-Healing Wounds, Rash and Ulcer. HEENT Not Present- Earache, Hearing Loss, Hoarseness, Nose Bleed, Oral Ulcers, Ringing in the Ears, Seasonal Allergies,  Sinus Pain, Sore Throat, Visual Disturbances, Wears glasses/contact lenses and Yellow Eyes. Respiratory Not Present- Bloody sputum, Chronic Cough, Difficulty Breathing, Snoring and Wheezing. Breast Not Present- Breast Mass, Breast Pain, Nipple Discharge and Skin Changes. Cardiovascular Not Present- Chest Pain, Difficulty Breathing Lying Down, Leg Cramps, Palpitations, Rapid Heart Rate, Shortness of Breath and Swelling of Extremities. Gastrointestinal Not Present- Abdominal Pain, Bloating, Bloody Stool, Change in Bowel Habits, Chronic diarrhea, Constipation, Difficulty Swallowing, Excessive gas, Gets full quickly at meals, Hemorrhoids, Indigestion, Nausea, Rectal Pain and Vomiting. Male Genitourinary Not Present- Blood in Urine, Change in Urinary Stream, Frequency, Impotence, Nocturia, Painful Urination, Urgency and Urine Leakage. Musculoskeletal Present- Joint Stiffness. Not Present- Back Pain, Joint Pain, Muscle Pain, Muscle Weakness and Swelling of Extremities. Neurological Not Present- Decreased Memory, Fainting, Headaches, Numbness, Seizures, Tingling, Tremor, Trouble walking and Weakness. Psychiatric Not Present- Anxiety, Bipolar, Change in Sleep Pattern, Depression, Fearful and Frequent crying. Endocrine Not Present- Cold Intolerance, Excessive Hunger, Hair Changes, Heat Intolerance, Hot flashes and New Diabetes. Hematology Not Present- Blood Thinners, Easy Bruising, Excessive bleeding, Gland problems, HIV and Persistent Infections.  Vitals (Chemira Jones CMA; 09/30/2016 9:58 AM) 09/30/2016 9:57 AM Weight: 194.4 lb Height: 73in Body Surface Area: 2.13 m Body Mass Index: 25.65 kg/m  Temp.: 52F(Oral)  BP: 112/82 (Sitting, Left Arm, Standard)     Physical Exam (Savien Mamula A. Magnus IvanBlackman MD; 09/30/2016 10:14 AM) The physical exam findings are as follows: Note:On examination, he has chronic draining sinus tracts in both his axilla. This is consistent with chronic  hidradenitis. Generally well in appearance Lungs clear CV RRR Abdomen soft, NT/ND    Assessment & Plan (Kindle Strohmeier A. 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 We discussed the chronicity of this disease and that  it is not curable. Surgery is limited to control major areas. He wished to proceed with wide excision of both the areas in the axilla. I believe this is reasonable. I discussed the surgical procedure in detail. I discussed the risk of the surgery which includes but is not limited to bleeding, infection, injury to surrounding structures, recurrence, having a chronic open wound, postoperative recovery, etc. I will place him on clindamycin but orally and in lotion form preoperatively. Surgery will be scheduled Current Plans Started Clindamycin HCl 300MG , 1 (one) Capsule three times daily, #21, 09/30/2016, Ref. x3. Started Clindamycin Phosphate 1%, 1 (one) Application two times daily, 1 Tube, 09/30/2016, Ref. x3.

## 2016-10-27 ENCOUNTER — Ambulatory Visit (HOSPITAL_COMMUNITY): Payer: 59 | Admitting: Critical Care Medicine

## 2016-10-27 ENCOUNTER — Ambulatory Visit (HOSPITAL_COMMUNITY): Payer: 59 | Admitting: Emergency Medicine

## 2016-10-27 ENCOUNTER — Ambulatory Visit (HOSPITAL_COMMUNITY)
Admission: RE | Admit: 2016-10-27 | Discharge: 2016-10-27 | Disposition: A | Discharge status: Home / Self Care | Payer: 59 | Source: Ambulatory Visit | Attending: Surgery | Admitting: Surgery

## 2016-10-27 ENCOUNTER — Encounter (HOSPITAL_COMMUNITY): Payer: Self-pay | Admitting: Surgery

## 2016-10-27 ENCOUNTER — Encounter (HOSPITAL_COMMUNITY)
Admission: RE | Disposition: A | Discharge status: Home / Self Care | Payer: Self-pay | Source: Ambulatory Visit | Attending: Surgery

## 2016-10-27 DIAGNOSIS — L732 Hidradenitis suppurativa: Secondary | ICD-10-CM | POA: Insufficient documentation

## 2016-10-27 DIAGNOSIS — Z79899 Other long term (current) drug therapy: Secondary | ICD-10-CM | POA: Insufficient documentation

## 2016-10-27 DIAGNOSIS — Z87891 Personal history of nicotine dependence: Secondary | ICD-10-CM | POA: Insufficient documentation

## 2016-10-27 DIAGNOSIS — I110 Hypertensive heart disease with heart failure: Secondary | ICD-10-CM | POA: Diagnosis not present

## 2016-10-27 DIAGNOSIS — J449 Chronic obstructive pulmonary disease, unspecified: Secondary | ICD-10-CM | POA: Diagnosis not present

## 2016-10-27 DIAGNOSIS — I509 Heart failure, unspecified: Secondary | ICD-10-CM | POA: Insufficient documentation

## 2016-10-27 HISTORY — PX: HYDRADENITIS EXCISION: SHX5243

## 2016-10-27 SURGERY — EXCISION, HIDRADENITIS, AXILLA
Anesthesia: General | Site: Axilla | Laterality: Bilateral

## 2016-10-27 MED ORDER — FENTANYL CITRATE (PF) 250 MCG/5ML IJ SOLN
INTRAMUSCULAR | Status: DC | PRN
  Administered 2016-10-27 (×2): 25 ug via INTRAVENOUS
  Administered 2016-10-27: 50 ug via INTRAVENOUS
  Administered 2016-10-27 (×2): 25 ug via INTRAVENOUS
  Administered 2016-10-27 (×3): 50 ug via INTRAVENOUS

## 2016-10-27 MED ORDER — 0.9 % SODIUM CHLORIDE (POUR BTL) OPTIME
TOPICAL | Status: DC | PRN
  Administered 2016-10-27: 1000 mL

## 2016-10-27 MED ORDER — MORPHINE SULFATE (PF) 2 MG/ML IV SOLN: 1.0000 mg | INTRAVENOUS | Status: DC | PRN

## 2016-10-27 MED ORDER — LIDOCAINE 2% (20 MG/ML) 5 ML SYRINGE
INTRAMUSCULAR | Status: AC
  Filled 2016-10-27: qty 20

## 2016-10-27 MED ORDER — SUCCINYLCHOLINE CHLORIDE 200 MG/10ML IV SOSY
PREFILLED_SYRINGE | INTRAVENOUS | Status: AC
  Filled 2016-10-27: qty 10

## 2016-10-27 MED ORDER — BUPIVACAINE-EPINEPHRINE 0.5% -1:200000 IJ SOLN
INTRAMUSCULAR | Status: DC | PRN
  Administered 2016-10-27: 30 mL

## 2016-10-27 MED ORDER — ONDANSETRON HCL 4 MG/2ML IJ SOLN
INTRAMUSCULAR | Status: AC
  Filled 2016-10-27: qty 6

## 2016-10-27 MED ORDER — DEXAMETHASONE SODIUM PHOSPHATE 10 MG/ML IJ SOLN
INTRAMUSCULAR | Status: AC
  Filled 2016-10-27: qty 3

## 2016-10-27 MED ORDER — EPHEDRINE 5 MG/ML INJ
INTRAVENOUS | Status: AC
  Filled 2016-10-27: qty 10

## 2016-10-27 MED ORDER — MIDAZOLAM HCL 5 MG/5ML IJ SOLN
INTRAMUSCULAR | Status: DC | PRN
  Administered 2016-10-27 (×2): 2 mg via INTRAVENOUS

## 2016-10-27 MED ORDER — KETOROLAC TROMETHAMINE 30 MG/ML IJ SOLN
INTRAMUSCULAR | Status: DC | PRN
  Administered 2016-10-27: 30 mg via INTRAVENOUS

## 2016-10-27 MED ORDER — BUPIVACAINE-EPINEPHRINE (PF) 0.5% -1:200000 IJ SOLN
INTRAMUSCULAR | Status: AC
  Filled 2016-10-27: qty 30

## 2016-10-27 MED ORDER — ACETAMINOPHEN 650 MG RE SUPP: 650.0000 mg | RECTAL | Status: DC | PRN

## 2016-10-27 MED ORDER — ACETAMINOPHEN 325 MG PO TABS: 650.0000 mg | ORAL_TABLET | ORAL | Status: DC | PRN

## 2016-10-27 MED ORDER — ROCURONIUM BROMIDE 10 MG/ML (PF) SYRINGE
PREFILLED_SYRINGE | INTRAVENOUS | Status: AC
  Filled 2016-10-27: qty 15

## 2016-10-27 MED ORDER — LACTATED RINGERS IV SOLN
INTRAVENOUS | Status: DC
  Administered 2016-10-27 (×2): via INTRAVENOUS

## 2016-10-27 MED ORDER — MIDAZOLAM HCL 2 MG/2ML IJ SOLN
INTRAMUSCULAR | Status: AC
  Filled 2016-10-27: qty 2

## 2016-10-27 MED ORDER — DEXAMETHASONE SODIUM PHOSPHATE 10 MG/ML IJ SOLN
INTRAMUSCULAR | Status: DC | PRN
  Administered 2016-10-27: 10 mg via INTRAVENOUS

## 2016-10-27 MED ORDER — HYDROMORPHONE HCL 1 MG/ML IJ SOLN: 0.2500 mg | INTRAMUSCULAR | Status: DC | PRN

## 2016-10-27 MED ORDER — OXYCODONE HCL 5 MG/5ML PO SOLN: 5.0000 mg | Freq: Once | ORAL | Status: DC | PRN

## 2016-10-27 MED ORDER — PHENYLEPHRINE 40 MCG/ML (10ML) SYRINGE FOR IV PUSH (FOR BLOOD PRESSURE SUPPORT)
PREFILLED_SYRINGE | INTRAVENOUS | Status: AC
  Filled 2016-10-27: qty 20

## 2016-10-27 MED ORDER — SODIUM CHLORIDE 0.9 % IV SOLN: 250.0000 mL | INTRAVENOUS | Status: DC | PRN

## 2016-10-27 MED ORDER — CHLORHEXIDINE GLUCONATE CLOTH 2 % EX PADS: 6.0000 | MEDICATED_PAD | Freq: Once | CUTANEOUS | Status: DC

## 2016-10-27 MED ORDER — FENTANYL CITRATE (PF) 250 MCG/5ML IJ SOLN
INTRAMUSCULAR | Status: AC
  Filled 2016-10-27: qty 5

## 2016-10-27 MED ORDER — SODIUM CHLORIDE 0.9% FLUSH: 3.0000 mL | Freq: Two times a day (BID) | INTRAVENOUS | Status: DC

## 2016-10-27 MED ORDER — ONDANSETRON HCL 4 MG/2ML IJ SOLN
INTRAMUSCULAR | Status: DC | PRN
  Administered 2016-10-27: 4 mg via INTRAVENOUS

## 2016-10-27 MED ORDER — OXYCODONE HCL 5 MG PO TABS: 5.0000 mg | ORAL_TABLET | Freq: Once | ORAL | Status: DC | PRN

## 2016-10-27 MED ORDER — PROPOFOL 10 MG/ML IV BOLUS
INTRAVENOUS | Status: DC | PRN
  Administered 2016-10-27: 20 mg via INTRAVENOUS
  Administered 2016-10-27: 10 mg via INTRAVENOUS
  Administered 2016-10-27: 200 mg via INTRAVENOUS
  Administered 2016-10-27: 20 mg via INTRAVENOUS
  Administered 2016-10-27: 100 mg via INTRAVENOUS
  Administered 2016-10-27: 10 mg via INTRAVENOUS

## 2016-10-27 MED ORDER — LIDOCAINE 2% (20 MG/ML) 5 ML SYRINGE
INTRAMUSCULAR | Status: DC | PRN
  Administered 2016-10-27: 60 mg via INTRAVENOUS

## 2016-10-27 MED ORDER — OXYCODONE HCL 5 MG PO TABS: 5.0000 mg | ORAL_TABLET | ORAL | Status: DC | PRN

## 2016-10-27 MED ORDER — SODIUM CHLORIDE 0.9% FLUSH: 3.0000 mL | INTRAVENOUS | Status: DC | PRN

## 2016-10-27 MED ORDER — OXYCODONE HCL 5 MG PO TABS: 5.0000 mg | ORAL_TABLET | ORAL | 0 refills | Status: DC | PRN

## 2016-10-27 SURGICAL SUPPLY — 39 items
CANISTER SUCT 3000ML PPV (MISCELLANEOUS) ×2 IMPLANT
CHLORAPREP W/TINT 26ML (MISCELLANEOUS) IMPLANT
COVER SURGICAL LIGHT HANDLE (MISCELLANEOUS) ×2 IMPLANT
DECANTER SPIKE VIAL GLASS SM (MISCELLANEOUS) IMPLANT
DERMABOND ADVANCED (GAUZE/BANDAGES/DRESSINGS)
DERMABOND ADVANCED .7 DNX12 (GAUZE/BANDAGES/DRESSINGS) IMPLANT
DRAPE LAPAROSCOPIC ABDOMINAL (DRAPES) ×2 IMPLANT
DRAPE LAPAROTOMY 100X72 PEDS (DRAPES) IMPLANT
ELECT CAUTERY BLADE 6.4 (BLADE) ×2 IMPLANT
ELECT REM PT RETURN 9FT ADLT (ELECTROSURGICAL) ×2
ELECTRODE REM PT RTRN 9FT ADLT (ELECTROSURGICAL) ×1 IMPLANT
GAUZE SPONGE 4X4 12PLY STRL (GAUZE/BANDAGES/DRESSINGS) ×2 IMPLANT
GAUZE SPONGE 4X4 12PLY STRL LF (GAUZE/BANDAGES/DRESSINGS) ×2 IMPLANT
GLOVE BIOGEL PI IND STRL 6.5 (GLOVE) ×1 IMPLANT
GLOVE BIOGEL PI INDICATOR 6.5 (GLOVE) ×1
GLOVE ECLIPSE 6.5 STRL STRAW (GLOVE) ×2 IMPLANT
GLOVE SURG SIGNA 7.5 PF LTX (GLOVE) ×2 IMPLANT
GLOVE SURG SS PI 6.0 STRL IVOR (GLOVE) ×2 IMPLANT
GLOVE SURG SS PI 7.0 STRL IVOR (GLOVE) ×2 IMPLANT
GOWN STRL REUS W/ TWL LRG LVL3 (GOWN DISPOSABLE) ×1 IMPLANT
GOWN STRL REUS W/ TWL XL LVL3 (GOWN DISPOSABLE) ×1 IMPLANT
GOWN STRL REUS W/TWL LRG LVL3 (GOWN DISPOSABLE) ×1
GOWN STRL REUS W/TWL XL LVL3 (GOWN DISPOSABLE) ×1
KIT BASIN OR (CUSTOM PROCEDURE TRAY) ×2 IMPLANT
KIT ROOM TURNOVER OR (KITS) ×2 IMPLANT
NEEDLE HYPO 25GX1X1/2 BEV (NEEDLE) ×2 IMPLANT
NS IRRIG 1000ML POUR BTL (IV SOLUTION) ×2 IMPLANT
PACK GENERAL/GYN (CUSTOM PROCEDURE TRAY) ×2 IMPLANT
PAD ABD 8X10 STRL (GAUZE/BANDAGES/DRESSINGS) ×4 IMPLANT
PAD ARMBOARD 7.5X6 YLW CONV (MISCELLANEOUS) ×2 IMPLANT
SOLUTION BETADINE 4OZ (MISCELLANEOUS) ×2 IMPLANT
SPECIMEN JAR MEDIUM (MISCELLANEOUS) IMPLANT
SUT ETHILON 2 0 FS 18 (SUTURE) ×10 IMPLANT
SUT ETHILON 3 0 FSL (SUTURE) IMPLANT
SUT MNCRL AB 4-0 PS2 18 (SUTURE) IMPLANT
SUT VIC AB 3-0 SH 18 (SUTURE) IMPLANT
SYR CONTROL 10ML LL (SYRINGE) ×2 IMPLANT
TOWEL OR 17X24 6PK STRL BLUE (TOWEL DISPOSABLE) IMPLANT
TOWEL OR 17X26 10 PK STRL BLUE (TOWEL DISPOSABLE) ×2 IMPLANT

## 2016-10-27 NOTE — Anesthesia Procedure Notes (Signed)
Procedure Name: LMA Insertion Date/Time: 10/27/2016 11:56 AM Performed by: Glo HerringLEE, Kielan Dreisbach B Pre-anesthesia Checklist: Patient identified, Emergency Drugs available, Suction available, Patient being monitored and Timeout performed Patient Re-evaluated:Patient Re-evaluated prior to induction Oxygen Delivery Method: Circle system utilized Preoxygenation: Pre-oxygenation with 100% oxygen Induction Type: IV induction Ventilation: Mask ventilation without difficulty LMA: LMA inserted LMA Size: 5.0 Number of attempts: 1 Placement Confirmation: positive ETCO2 and breath sounds checked- equal and bilateral Tube secured with: Tape Dental Injury: Teeth and Oropharynx as per pre-operative assessment

## 2016-10-27 NOTE — Op Note (Signed)
WIDE EXCISION HIDRADENITIS BILATERAL AXILLA  Procedure Note  Susette RacerBrian K Castelli 10/27/2016   Pre-op Diagnosis: hidradenitis     Post-op Diagnosis: same  Procedure(s): WIDE EXCISION HIDRADENITIS BILATERAL AXILLA 30 cm square left and 30 cm square right  Surgeon(s): Abigail MiyamotoBlackman, Leland Raver, MD  Anesthesia: General  Staff:  Circulator: Jolinda CroakBigelow, Crystal B, RN Scrub Person: Cherlynn PerchesPerez, Xinia T Circulator Assistant: Tenna ChildSmail, Lauren C, RN  Estimated Blood Loss: less than 50 mL                Findings: The patient was found to have bilateral axillary hidradenitis. A proximally 30 cm of skin and subjacent tissue was removed from the left axilla and 30 cm of skin and subjacent tissue was removed from the right axilla. These areas included the chronic draining sinus tracts consistent with hidradenitis  Procedure: The patient was brought to the operating room and identified as the correct patient. He was placed supine on the operating table and general anesthesia was induced. His bilateral axilla were then prepped and draped in the usual sterile fashion. I anesthetized both axilla with Marcaine. I then made elliptical incisions in both axillas with a scalpel. The entire extent of the elliptical incisions on each side was 30 cm. I excised all the skin and subjacent tissue containing the chronic hidradenitis on both sides with the electrocautery. Again, both areas were each 30 cm. I then achieved hemostasis with the cautery both incisions. I anesthetized further with Marcaine. I irrigated with saline. I then closed both incisions with 2-0 nylon sutures in both mattress and interrupted fashion. Gauze and tape were then applied. The patient tolerated procedure well. All the counts were correct at the end of the procedure. The patient was then extubated in the operating room and taken in a stable condition to the recovery room.            Caira Poche A   Date: 10/27/2016  Time: 12:41 PM

## 2016-10-27 NOTE — Interval H&P Note (Signed)
History and Physical Interval Note: no change in H and P  10/27/2016 10:36 AM  Anthony Atkinson  has presented today for surgery, with the diagnosis of hidradenitis  The various methods of treatment have been discussed with the patient and family. After consideration of risks, benefits and other options for treatment, the patient has consented to  Procedure(s): WIDE EXCISION HIDRADENITIS BILATERAL AXILLA (Bilateral) as a surgical intervention .  The patient's history has been reviewed, patient examined, no change in status, stable for surgery.  I have reviewed the patient's chart and labs.  Questions were answered to the patient's satisfaction.     Erik Nessel A

## 2016-10-27 NOTE — Anesthesia Preprocedure Evaluation (Signed)
Anesthesia Evaluation  Patient identified by MRN, date of birth, ID band Patient awake    Reviewed: Allergy & Precautions, NPO status   Airway Mallampati: II  TM Distance: >3 FB Neck ROM: Full    Dental  (+) Teeth Intact   Pulmonary COPD, former smoker,    breath sounds clear to auscultation       Cardiovascular hypertension, +CHF   Rhythm:Regular Rate:Normal     Neuro/Psych    GI/Hepatic negative GI ROS, Neg liver ROS,   Endo/Other  negative endocrine ROS  Renal/GU negative Renal ROS     Musculoskeletal negative musculoskeletal ROS (+)   Abdominal   Peds  Hematology negative hematology ROS (+)   Anesthesia Other Findings   Reproductive/Obstetrics                             Anesthesia Physical Anesthesia Plan  ASA: II  Anesthesia Plan: General   Post-op Pain Management:    Induction: Intravenous  PONV Risk Score and Plan: 3 and Ondansetron, Dexamethasone, Midazolam, Propofol infusion and Treatment may vary due to age or medical condition  Airway Management Planned: LMA  Additional Equipment:   Intra-op Plan:   Post-operative Plan: Extubation in OR  Informed Consent: I have reviewed the patients History and Physical, chart, labs and discussed the procedure including the risks, benefits and alternatives for the proposed anesthesia with the patient or authorized representative who has indicated his/her understanding and acceptance.   Dental advisory given  Plan Discussed with: CRNA  Anesthesia Plan Comments:         Anesthesia Quick Evaluation

## 2016-10-27 NOTE — Discharge Instructions (Signed)
Expect a lot of drainage  Change dry bandages daily and as needed  Ok to shower starting tomorrow  Ice pack, ibuprofen, and tylenol also for pain

## 2016-10-27 NOTE — Anesthesia Postprocedure Evaluation (Signed)
Anesthesia Post Note  Patient: Anthony Atkinson  Procedure(s) Performed: Procedure(s) (LRB): WIDE EXCISION HIDRADENITIS BILATERAL AXILLA (Bilateral)     Patient location during evaluation: PACU Anesthesia Type: General Level of consciousness: awake and alert Pain management: pain level controlled Vital Signs Assessment: post-procedure vital signs reviewed and stable Respiratory status: spontaneous breathing, nonlabored ventilation, respiratory function stable and patient connected to nasal cannula oxygen Cardiovascular status: blood pressure returned to baseline and stable Postop Assessment: no signs of nausea or vomiting Anesthetic complications: no    Last Vitals:  Vitals:   10/27/16 1320 10/27/16 1325  BP:  (!) 145/97  Pulse: (!) 58 (!) 58  Resp: 11 15  Temp: 36.6 C 36.6 C  SpO2: 100% 100%    Last Pain:  Vitals:   10/27/16 1325  TempSrc:   PainSc: 0-No pain                 Kimiko Common,JAMES TERRILL

## 2016-10-27 NOTE — Transfer of Care (Signed)
Immediate Anesthesia Transfer of Care Note  Patient: Anthony Atkinson  Procedure(s) Performed: Procedure(s): WIDE EXCISION HIDRADENITIS BILATERAL AXILLA (Bilateral)  Patient Location: PACU  Anesthesia Type:General  Level of Consciousness: awake, alert  and oriented  Airway & Oxygen Therapy: Patient Spontanous Breathing and Patient connected to nasal cannula oxygen  Post-op Assessment: Report given to RN, Post -op Vital signs reviewed and stable and Patient moving all extremities X 4  Post vital signs: Reviewed and stable  Last Vitals:  Vitals:   10/27/16 1020  BP: 129/85  Pulse: 71  Resp: 18  Temp: 36.8 C  SpO2: 100%    Last Pain:  Vitals:   10/27/16 1029  TempSrc:   PainSc: 1       Patients Stated Pain Goal: 3 (10/27/16 1029)  Complications: No apparent anesthesia complications

## 2016-10-28 ENCOUNTER — Encounter (HOSPITAL_COMMUNITY): Payer: Self-pay | Admitting: Surgery

## 2017-01-12 DIAGNOSIS — L700 Acne vulgaris: Secondary | ICD-10-CM | POA: Diagnosis not present

## 2017-01-12 DIAGNOSIS — L720 Epidermal cyst: Secondary | ICD-10-CM | POA: Diagnosis not present

## 2017-03-08 ENCOUNTER — Ambulatory Visit: Payer: 59 | Admitting: Internal Medicine

## 2017-03-20 ENCOUNTER — Ambulatory Visit: Payer: 59 | Admitting: Internal Medicine

## 2017-03-21 ENCOUNTER — Other Ambulatory Visit: Payer: Self-pay | Admitting: *Deleted

## 2017-03-21 ENCOUNTER — Telehealth: Payer: Self-pay | Admitting: Internal Medicine

## 2017-03-21 DIAGNOSIS — I10 Essential (primary) hypertension: Secondary | ICD-10-CM

## 2017-03-21 MED ORDER — LISINOPRIL-HYDROCHLOROTHIAZIDE 20-25 MG PO TABS
1.0000 | ORAL_TABLET | Freq: Every day | ORAL | 1 refills | Status: DC
Start: 2017-03-21 — End: 2017-07-10

## 2017-03-21 NOTE — Telephone Encounter (Unsigned)
Copied from CRM [id number]. Topic: Quick Communication - Rx Refill/Question >> Mar 21, 2017  8:52 AM Percival SpanishKennedy, Cheryl W wrote: Has the patient contacted their pharmacy     yes  RX lisinopril-hydrochlorothiazide (PRINZIDE,ZESTORETIC) 20-25 MG tablet   Pharmacy [facility] [address]    Agent: Please be advised that RX refills may take up to 3 business days. We ask that you follow-up with your pharmacy.

## 2017-05-01 DIAGNOSIS — Z09 Encounter for follow-up examination after completed treatment for conditions other than malignant neoplasm: Secondary | ICD-10-CM | POA: Diagnosis not present

## 2017-06-01 ENCOUNTER — Other Ambulatory Visit: Payer: Self-pay | Admitting: Internal Medicine

## 2017-06-30 ENCOUNTER — Other Ambulatory Visit: Payer: Self-pay | Admitting: Nurse Practitioner

## 2017-06-30 DIAGNOSIS — I10 Essential (primary) hypertension: Secondary | ICD-10-CM

## 2017-07-03 ENCOUNTER — Telehealth: Payer: Self-pay | Admitting: Internal Medicine

## 2017-07-03 NOTE — Telephone Encounter (Signed)
Copied from CRM [phone]. Topic: General - Other >> Jul 03, 2017  8:16 AM Elliot GaultBell, Tiffany M wrote:  Relation to pt: self  Call back number: [phone] Pharm[facility]: [facility] [place] - [id number] [place] [phone] (Phone) [phone])  Reason for call:  Patient requesting 90 day supply of cloNIDine (CATAPRES) 0.3 MG tablet,patient contacted mail service and they informed to contact PCP office due this being the first time patient is using mail order. Patient states he's completely out, informed patient please allow 48 to 72 hour turn around, please advise

## 2017-07-03 NOTE — Telephone Encounter (Signed)
Tried to call patient to schedule. No answer and not able to leave a message.

## 2017-07-03 NOTE — Telephone Encounter (Signed)
Refill request for Clonidine 0.3 mg tab  Last refill was 06/01/17 for 60 tabs. LOV  09/19/16  Provider  E. Okey Duprerawford, MD  Attempted to call patient and schedule an office visit, no answer and unable to leave a message.   He is requesting [facility]

## 2017-07-03 NOTE — Telephone Encounter (Signed)
Refill request for Clonidine (Catapres)0.3 tab Last refill on 06/02/17 stated needs office visit for further refills. LOV was 09/19/16 with Dr. Elease EtienneNche. No OV with Dr. Okey Duprerawford within last year. I cannot find where we have used [facility] for this medication. Routed to office for further review.   Copied from CRM [id number]. Topic: General - Other >> Jul 03, 2017  8:16 AM Elliot GaultBell, Tiffany M wrote: Relation to pt: self  Call back number: [phone] [facility] [place] [id number] [place] [phone] (Phone) [phone])  Reason for call:  Patient requesting 90 day supply of cloNIDine (CATAPRES) 0.3 MG tablet,patient contacted mail service and they informed to contact PCP office due this being the first time patient is using mail order. Patient states he's completely out, informed patient please allow 48 to 72 hour turn around, please advise  >> Jul 03, 2017  8:22 AM Elliot GaultBell, Tiffany M wrote: Relation to pt: self  Call back number: [phone] [facility] [place] - [id number] [place] [phone] (Phone) [phone])  Reason for call:  Patient requesting 90 day supply of cloNIDine (CATAPRES) 0.3 MG tablet,patient contacted mail service and they informed to contact PCP office due this being the first time patient is using mail order. Patient states he's completely out, informed patient please allow 48 to 72 hour turn around, please advise

## 2017-07-03 NOTE — Telephone Encounter (Signed)
Can you please make patient an appointment before any refills can be sent in. Thank you

## 2017-07-10 ENCOUNTER — Ambulatory Visit (INDEPENDENT_AMBULATORY_CARE_PROVIDER_SITE_OTHER): Payer: 59 | Admitting: Internal Medicine

## 2017-07-10 ENCOUNTER — Encounter: Payer: Self-pay | Admitting: Internal Medicine

## 2017-07-10 DIAGNOSIS — I10 Essential (primary) hypertension: Secondary | ICD-10-CM

## 2017-07-10 MED ORDER — LISINOPRIL-HYDROCHLOROTHIAZIDE 20-25 MG PO TABS: 1.0000 | ORAL_TABLET | Freq: Every day | ORAL | 3 refills | Status: DC

## 2017-07-10 MED ORDER — SILDENAFIL CITRATE 100 MG PO TABS: 50.0000 mg | ORAL_TABLET | Freq: Every day | ORAL | 11 refills | Status: DC | PRN

## 2017-07-10 MED ORDER — HYDRALAZINE HCL 25 MG PO TABS: 25.0000 mg | ORAL_TABLET | Freq: Two times a day (BID) | ORAL | 3 refills | Status: DC

## 2017-07-10 MED ORDER — CLONIDINE HCL 0.3 MG PO TABS: 0.3000 mg | ORAL_TABLET | Freq: Two times a day (BID) | ORAL | 3 refills | Status: DC

## 2017-07-10 MED ORDER — HYDRALAZINE HCL 25 MG PO TABS: 25.0000 mg | ORAL_TABLET | Freq: Three times a day (TID) | ORAL | 3 refills | Status: DC

## 2017-07-10 NOTE — Progress Notes (Signed)
   Subjective:    Patient ID: Anthony Atkinson, male    DOB: 14-Sep-1970, 47 y.o.   MRN: [id number]  HPI The patient is a 47 YO man coming in for follow up of blood pressure. Still taking his medications clonidine, hydralazine, lisinopril/hctz. Denies chest pains or SOB. Denies abdominal pain or nausea or vomiting. Denies constipation or diarrhea. Is having ED and low sex drive from the medications but is reluctant to change given the problems he has had with BP in the past.   Review of Systems  Constitutional: Negative.   HENT: Negative.   Eyes: Negative.   Respiratory: Negative for cough, chest tightness and shortness of breath.   Cardiovascular: Negative for chest pain, palpitations and leg swelling.  Gastrointestinal: Negative for abdominal distention, abdominal pain, constipation, diarrhea, nausea and vomiting.  Genitourinary:       Ed  Musculoskeletal: Negative.   Skin: Negative.   Neurological: Negative.   Psychiatric/Behavioral: Negative.       Objective:   Physical Exam  Constitutional: He is oriented to person, place, and time. He appears well-developed and well-nourished.  HENT:  Head: Normocephalic and atraumatic.  Eyes: EOM are normal.  Neck: Normal range of motion.  Cardiovascular: Normal rate and regular rhythm.  Pulmonary/Chest: Effort normal and breath sounds normal. No respiratory distress. He has no wheezes. He has no rales.  Abdominal: Soft. He exhibits no distension. There is no tenderness. There is no rebound.  Musculoskeletal: He exhibits no edema.  Neurological: He is alert and oriented to person, place, and time. Coordination normal.  Skin: Skin is warm and dry.  Psychiatric: He has a normal mood and affect.   Vitals:   07/10/17 1316  BP: 100/64  Pulse: 73  Temp: 98.2 F (36.8 C)  TempSrc: Oral  SpO2: 99%  Weight: 206 lb (93.4 kg)  Height:  (1.854 m)      Assessment & Plan:

## 2017-07-10 NOTE — Assessment & Plan Note (Signed)
BP at goal today, refilled clonidine, hydralazine, lisinopril/hctz. Rx for viagra for side effects although we talked about how hydralazine and clonidine can both cause ED.

## 2017-07-10 NOTE — Patient Instructions (Signed)
We will not check blood work today.  We have sent in viagra to try if needed. If you get an erection lasting longer than 4 hours please seek medical care.

## 2017-09-17 ENCOUNTER — Other Ambulatory Visit: Payer: Self-pay | Admitting: Nurse Practitioner

## 2017-09-17 DIAGNOSIS — I10 Essential (primary) hypertension: Secondary | ICD-10-CM

## 2017-11-10 DIAGNOSIS — L732 Hidradenitis suppurativa: Secondary | ICD-10-CM | POA: Diagnosis not present

## 2017-12-08 DIAGNOSIS — L732 Hidradenitis suppurativa: Secondary | ICD-10-CM | POA: Diagnosis not present

## 2017-12-19 DIAGNOSIS — L732 Hidradenitis suppurativa: Secondary | ICD-10-CM | POA: Diagnosis not present

## 2017-12-26 ENCOUNTER — Other Ambulatory Visit: Payer: Self-pay

## 2017-12-26 ENCOUNTER — Emergency Department (HOSPITAL_COMMUNITY): Payer: 59

## 2017-12-26 ENCOUNTER — Encounter (HOSPITAL_COMMUNITY): Payer: Self-pay | Admitting: Emergency Medicine

## 2017-12-26 ENCOUNTER — Inpatient Hospital Stay (HOSPITAL_COMMUNITY)
Admission: EM | Admit: 2017-12-26 | Discharge: 2017-12-28 | DRG: 394 | Disposition: A | Discharge status: Home / Self Care | Payer: 59 | Attending: Family Medicine | Admitting: Family Medicine

## 2017-12-26 DIAGNOSIS — I248 Other forms of acute ischemic heart disease: Secondary | ICD-10-CM | POA: Diagnosis present

## 2017-12-26 DIAGNOSIS — D72829 Elevated white blood cell count, unspecified: Secondary | ICD-10-CM | POA: Diagnosis not present

## 2017-12-26 DIAGNOSIS — E860 Dehydration: Secondary | ICD-10-CM | POA: Diagnosis present

## 2017-12-26 DIAGNOSIS — R1115 Cyclical vomiting syndrome unrelated to migraine: Principal | ICD-10-CM

## 2017-12-26 DIAGNOSIS — I10 Essential (primary) hypertension: Secondary | ICD-10-CM | POA: Diagnosis not present

## 2017-12-26 DIAGNOSIS — E861 Hypovolemia: Secondary | ICD-10-CM | POA: Diagnosis present

## 2017-12-26 DIAGNOSIS — Z79899 Other long term (current) drug therapy: Secondary | ICD-10-CM

## 2017-12-26 DIAGNOSIS — R918 Other nonspecific abnormal finding of lung field: Secondary | ICD-10-CM | POA: Diagnosis not present

## 2017-12-26 DIAGNOSIS — K449 Diaphragmatic hernia without obstruction or gangrene: Secondary | ICD-10-CM | POA: Diagnosis not present

## 2017-12-26 DIAGNOSIS — R1013 Epigastric pain: Secondary | ICD-10-CM

## 2017-12-26 DIAGNOSIS — R112 Nausea with vomiting, unspecified: Secondary | ICD-10-CM | POA: Diagnosis not present

## 2017-12-26 DIAGNOSIS — Z87441 Personal history of nephrotic syndrome: Secondary | ICD-10-CM | POA: Diagnosis not present

## 2017-12-26 DIAGNOSIS — N179 Acute kidney failure, unspecified: Secondary | ICD-10-CM

## 2017-12-26 DIAGNOSIS — R066 Hiccough: Secondary | ICD-10-CM | POA: Diagnosis present

## 2017-12-26 DIAGNOSIS — E872 Acidosis, unspecified: Secondary | ICD-10-CM

## 2017-12-26 DIAGNOSIS — E876 Hypokalemia: Secondary | ICD-10-CM | POA: Diagnosis not present

## 2017-12-26 DIAGNOSIS — F1290 Cannabis use, unspecified, uncomplicated: Secondary | ICD-10-CM

## 2017-12-26 DIAGNOSIS — Z8719 Personal history of other diseases of the digestive system: Secondary | ICD-10-CM

## 2017-12-26 DIAGNOSIS — I5030 Unspecified diastolic (congestive) heart failure: Secondary | ICD-10-CM | POA: Diagnosis not present

## 2017-12-26 DIAGNOSIS — R1110 Vomiting, unspecified: Secondary | ICD-10-CM | POA: Diagnosis not present

## 2017-12-26 LAB — COMPREHENSIVE METABOLIC PANEL
ALT: 17 U/L (ref 0–44)
AST: 27 U/L (ref 15–41)
Albumin: 4.9 g/dL (ref 3.5–5.0)
Alkaline Phosphatase: 52 U/L (ref 38–126)
Anion gap: 17 — ABNORMAL HIGH (ref 5–15)
BUN: 19 mg/dL (ref 6–20)
CHLORIDE: 105 mmol/L (ref 98–111)
CO2: 22 mmol/L (ref 22–32)
Calcium: 10.8 mg/dL — ABNORMAL HIGH (ref 8.9–10.3)
Creatinine, Ser: 1.49 mg/dL — ABNORMAL HIGH (ref 0.61–1.24)
GFR calc Af Amer: >60 mL/min (ref >60)
GFR, EST NON AFRICAN AMERICAN: 54 mL/min — AB (ref >60)
Glucose, Bld: 171 mg/dL — ABNORMAL HIGH (ref 70–99)
Potassium: 3.8 mmol/L (ref 3.5–5.1)
SODIUM: 144 mmol/L (ref 135–145)
Total Bilirubin: 1 mg/dL (ref 0.3–1.2)
Total Protein: 9.2 g/dL — ABNORMAL HIGH (ref 6.5–8.1)

## 2017-12-26 LAB — CBC
HCT: 44 % (ref 39.0–52.0)
Hemoglobin: 14.8 g/dL (ref 13.0–17.0)
MCH: 32.3 pg (ref 26.0–34.0)
MCHC: 33.6 g/dL (ref 30.0–36.0)
MCV: 96.1 fL (ref 80.0–100.0)
NRBC: 0 % (ref 0.0–0.2)
PLATELETS: 352 10*3/uL (ref 150–400)
RBC: 4.58 MIL/uL (ref 4.22–5.81)
RDW: 13 % (ref 11.5–15.5)
WBC: 11.7 10*3/uL — AB (ref 4.0–10.5)

## 2017-12-26 LAB — I-STAT CG4 LACTIC ACID, ED
LACTIC ACID, VENOUS: 3.85 mmol/L — AB (ref 0.5–1.9)
Lactic Acid, Venous: 5.03 mmol/L (ref 0.5–1.9)

## 2017-12-26 LAB — URINALYSIS, ROUTINE W REFLEX MICROSCOPIC
Bilirubin Urine: NEGATIVE
GLUCOSE, UA: 150 mg/dL — AB
Hgb urine dipstick: NEGATIVE
Ketones, ur: NEGATIVE mg/dL
Leukocytes, UA: NEGATIVE
Nitrite: NEGATIVE
PROTEIN: NEGATIVE mg/dL
SPECIFIC GRAVITY, URINE: 1.039 — AB (ref 1.005–1.030)
pH: 6 (ref 5.0–8.0)

## 2017-12-26 LAB — I-STAT TROPONIN, ED: TROPONIN I, POC: 0 ng/mL (ref 0.00–0.08)

## 2017-12-26 LAB — LIPASE, BLOOD: LIPASE: 36 U/L (ref 11–51)

## 2017-12-26 LAB — TROPONIN I

## 2017-12-26 LAB — LACTIC ACID, PLASMA: LACTIC ACID, VENOUS: 2.8 mmol/L — AB (ref 0.5–1.9)

## 2017-12-26 MED ORDER — VANCOMYCIN HCL IN DEXTROSE 1-5 GM/200ML-% IV SOLN
1000.0000 mg | Freq: Once | INTRAVENOUS | Status: AC
  Administered 2017-12-26: 1000 mg via INTRAVENOUS
  Filled 2017-12-26: qty 200

## 2017-12-26 MED ORDER — METOCLOPRAMIDE HCL 5 MG/ML IJ SOLN: 5.0000 mg | Freq: Four times a day (QID) | INTRAMUSCULAR | Status: DC

## 2017-12-26 MED ORDER — SODIUM CHLORIDE 0.9 % IV BOLUS
1000.0000 mL | Freq: Once | INTRAVENOUS | Status: AC
  Administered 2017-12-26: 1000 mL via INTRAVENOUS

## 2017-12-26 MED ORDER — ONDANSETRON HCL 4 MG/2ML IJ SOLN
4.0000 mg | Freq: Once | INTRAMUSCULAR | Status: AC
  Administered 2017-12-26: 4 mg via INTRAVENOUS
  Filled 2017-12-26: qty 2

## 2017-12-26 MED ORDER — METOCLOPRAMIDE HCL 5 MG/ML IJ SOLN: 10.0000 mg | Freq: Four times a day (QID) | INTRAMUSCULAR | Status: DC | PRN

## 2017-12-26 MED ORDER — PIPERACILLIN-TAZOBACTAM 3.375 G IVPB
3.3750 g | Freq: Three times a day (TID) | INTRAVENOUS | Status: DC
  Administered 2017-12-26 – 2017-12-28 (×5): 3.375 g via INTRAVENOUS
  Filled 2017-12-26 (×6): qty 50

## 2017-12-26 MED ORDER — SODIUM CHLORIDE 0.9 % IV SOLN
8.0000 mg | Freq: Three times a day (TID) | INTRAVENOUS | Status: DC
  Administered 2017-12-26 – 2017-12-28 (×6): 8 mg via INTRAVENOUS
  Filled 2017-12-26 (×7): qty 4

## 2017-12-26 MED ORDER — PIPERACILLIN-TAZOBACTAM 3.375 G IVPB: 3.3750 g | Freq: Three times a day (TID) | INTRAVENOUS | Status: DC

## 2017-12-26 MED ORDER — HALOPERIDOL LACTATE 5 MG/ML IJ SOLN
2.0000 mg | Freq: Once | INTRAMUSCULAR | Status: AC
  Administered 2017-12-26: 2 mg via INTRAVENOUS
  Filled 2017-12-26: qty 1

## 2017-12-26 MED ORDER — HYDRALAZINE HCL 20 MG/ML IJ SOLN
5.0000 mg | Freq: Four times a day (QID) | INTRAMUSCULAR | Status: DC | PRN
  Administered 2017-12-26 – 2017-12-27 (×2): 5 mg via INTRAVENOUS
  Filled 2017-12-26 (×2): qty 1

## 2017-12-26 MED ORDER — SODIUM CHLORIDE 0.9 % IV BOLUS
1000.0000 mL | Freq: Once | INTRAVENOUS | Status: AC
Start: 2017-12-26 — End: 2017-12-26
  Administered 2017-12-26: 1000 mL via INTRAVENOUS

## 2017-12-26 MED ORDER — HYDROMORPHONE HCL 1 MG/ML IJ SOLN
0.5000 mg | Freq: Once | INTRAMUSCULAR | Status: AC
  Administered 2017-12-26: 0.5 mg via INTRAVENOUS
  Filled 2017-12-26: qty 1

## 2017-12-26 MED ORDER — METOCLOPRAMIDE HCL 5 MG/ML IJ SOLN
5.0000 mg | Freq: Four times a day (QID) | INTRAMUSCULAR | Status: DC | PRN
  Administered 2017-12-26: 5 mg via INTRAVENOUS
  Filled 2017-12-26: qty 2

## 2017-12-26 MED ORDER — LABETALOL HCL 5 MG/ML IV SOLN: 10.0000 mg | INTRAVENOUS | Status: DC | PRN

## 2017-12-26 MED ORDER — ONDANSETRON HCL 4 MG/2ML IJ SOLN
4.0000 mg | Freq: Four times a day (QID) | INTRAMUSCULAR | Status: DC | PRN
  Filled 2017-12-26: qty 2

## 2017-12-26 MED ORDER — IOPAMIDOL (ISOVUE-300) INJECTION 61%
INTRAVENOUS | Status: AC
  Filled 2017-12-26: qty 100

## 2017-12-26 MED ORDER — PANTOPRAZOLE SODIUM 40 MG IV SOLR
40.0000 mg | Freq: Two times a day (BID) | INTRAVENOUS | Status: DC
  Administered 2017-12-26 – 2017-12-27 (×3): 40 mg via INTRAVENOUS
  Filled 2017-12-26 (×3): qty 40

## 2017-12-26 MED ORDER — METOCLOPRAMIDE HCL 5 MG/ML IJ SOLN
10.0000 mg | Freq: Four times a day (QID) | INTRAMUSCULAR | Status: DC
  Administered 2017-12-26 – 2017-12-27 (×4): 10 mg via INTRAVENOUS
  Filled 2017-12-26 (×4): qty 2

## 2017-12-26 MED ORDER — HYDROMORPHONE HCL 1 MG/ML IJ SOLN
1.0000 mg | INTRAMUSCULAR | Status: DC | PRN
  Administered 2017-12-26 – 2017-12-27 (×4): 1 mg via INTRAVENOUS
  Filled 2017-12-26 (×4): qty 1

## 2017-12-26 MED ORDER — LACTATED RINGERS IV SOLN
INTRAVENOUS | Status: DC
  Administered 2017-12-26 – 2017-12-27 (×2): via INTRAVENOUS

## 2017-12-26 MED ORDER — CLONIDINE HCL 0.2 MG/24HR TD PTWK
0.2000 mg | MEDICATED_PATCH | TRANSDERMAL | Status: DC
  Administered 2017-12-26: 0.2 mg via TRANSDERMAL
  Filled 2017-12-26: qty 1

## 2017-12-26 MED ORDER — LABETALOL HCL 5 MG/ML IV SOLN
10.0000 mg | INTRAVENOUS | Status: DC | PRN
  Administered 2017-12-26 – 2017-12-27 (×5): 10 mg via INTRAVENOUS
  Filled 2017-12-26 (×5): qty 4

## 2017-12-26 MED ORDER — IOPAMIDOL (ISOVUE-300) INJECTION 61%
100.0000 mL | Freq: Once | INTRAVENOUS | Status: AC | PRN
  Administered 2017-12-26: 100 mL via INTRAVENOUS

## 2017-12-26 MED ORDER — HYDROMORPHONE HCL 1 MG/ML IJ SOLN
0.5000 mg | INTRAMUSCULAR | Status: DC | PRN
  Administered 2017-12-26: 0.5 mg via INTRAVENOUS
  Filled 2017-12-26: qty 1

## 2017-12-26 MED ORDER — SODIUM CHLORIDE 0.9 % IJ SOLN
INTRAMUSCULAR | Status: AC
  Filled 2017-12-26: qty 50

## 2017-12-26 MED ORDER — FLUCONAZOLE IN SODIUM CHLORIDE 400-0.9 MG/200ML-% IV SOLN
800.0000 mg | Freq: Once | INTRAVENOUS | Status: AC
  Administered 2017-12-26: 800 mg via INTRAVENOUS
  Filled 2017-12-26 (×2): qty 400

## 2017-12-26 MED ORDER — PIPERACILLIN-TAZOBACTAM 4.5 G IVPB: 4.5000 g | Freq: Once | INTRAVENOUS | Status: DC

## 2017-12-26 MED ORDER — PIPERACILLIN-TAZOBACTAM 3.375 G IVPB
3.3750 g | Freq: Once | INTRAVENOUS | Status: AC
  Administered 2017-12-26: 3.375 g via INTRAVENOUS
  Filled 2017-12-26: qty 50

## 2017-12-26 NOTE — Consult Note (Addendum)
Consultation  Referring Provider:   Dr. Pilar Plate Primary Care Physician:  Myrlene Broker, MD Primary Gastroenterologist: Dr. Lavon Paganini         Reason for Consultation: Vomiting and abdominal pain            HPI:   Anthony Atkinson is a 47 y.o. 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    Today, patient describes that he had sudden onset of nausea and vomiting this morning at 5:30am associated with constant, moderate, sharp epigastric pain rated as a 10/10 and diffuse diaphoresis.  Describes feeling similar to his esophageal perforation in the past.  Explains that his wife has been out of town he has been eating some things that he normally does not, this includes 2 cheeseburgers last night, the last around 9 PM.  Describes intermittent rounds of nausea and vomiting over the past few years.  Tells me that he woke up for work at 530 this morning and started with severe nausea and at least 7 rounds of vomiting which was not forceful per his recollection it just "came out".  This was just water and bile.  Tried taking a few Zofran which did not help.  He then smoked a joint but this did not help either.  Pain currently a 6-7/10.  Patient denies nausea currently he remains diaphoretic.    Denies weight loss or recent addition of medicines.  ER course:   CT abdomen pelvis with contrast with no acute findings, small hiatal hernia and aortoiliac atherosclerosis, CT chest with contrast with no acute findings, lactic acid 5.03 -->3.85, white count elevated 11.7, CBC otherwise normal, creatinine elevated 1.49, anion gap elevated at 17  GI history: 02/17/2016 office visit with Willette Cluster: To discuss chronic intermittent vomiting and following up from Boerhaave syndrome, described a previous episode in his 36s at that time patient  admitted to smoking marijuana daily but did not feel contributed to episodic vomiting, it was recommended that he use Zofran at onset of nausea  Past Medical History:  Diagnosis Date  . Esophageal perforation   . Hidradenitis    Bilateral Axilla  . Hypertension     Past Surgical History:  Procedure Laterality Date  . FACIAL COSMETIC SURGERY  11/2015   cysts removed d/t infected ingrown hairs "3 wks ago" per pt (asked on 12/15/15)  . HYDRADENITIS EXCISION Bilateral 10/27/2016   Procedure: WIDE EXCISION HIDRADENITIS BILATERAL AXILLA;  Surgeon: Abigail Miyamoto, MD;  Location: [facility] OR;  Service: General;  Laterality: Bilateral;    Family History  Problem Relation Age of Onset  . Colon cancer Neg Hx   . Esophageal cancer Neg Hx   . Pancreatic cancer Neg Hx   . Stomach cancer Neg Hx   . Liver disease Neg Hx      Social History   Tobacco Use  . Smoking status: Former Smoker    Packs/day: 0.50    Types: Cigarettes    Last attempt to quit: 12/14/2015    Years since quitting: 2.0  . Smokeless tobacco: Never Used  Substance Use Topics  . Alcohol use: Yes    Comment: occasional  . Drug use: Yes    Types: Marijuana    Comment: 1/day per pt    Prior to Admission medications   Medication Sig Start Date End Date Taking? Authorizing  Provider  clindamycin (CLEOCIN T) 1 % external solution Apply 1 application topically 2 (two) times daily. 12/19/17  Yes [provider]  cloNIDine (CATAPRES) 0.3 MG tablet Take 1 tablet (0.3 mg total) by mouth 2 (two) times daily. 07/10/17  Yes Myrlene Broker, MD  hydrALAZINE (APRESOLINE) 25 MG tablet Take 1 tablet (25 mg total) by mouth 2 (two) times daily. 07/10/17  Yes Myrlene Broker, MD  lisinopril-hydrochlorothiazide (PRINZIDE,ZESTORETIC) 20-25 MG tablet Take 1 tablet by mouth daily. 07/10/17  Yes Myrlene Broker, MD  sildenafil (VIAGRA) 100 MG tablet Take 0.5-1 tablets (50-100 mg total) by mouth daily as needed for erectile  dysfunction. 07/10/17  Yes Myrlene Broker, MD  tretinoin (RETIN-A) 0.05 % cream Apply 1 application topically 2 (two) times daily as needed (wound healing).  09/30/16  Yes [provider]    Current Facility-Administered Medications  Medication Dose Route Frequency Provider Last Rate Last Dose  . fluconazole (DIFLUCAN) IVPB 800 mg  800 mg Intravenous Once Sabas Sous, MD 100 mL/hr at 12/26/17 1136 800 mg at 12/26/17 1136  . HYDROmorphone (DILAUDID) injection 0.5 mg  0.5 mg Intravenous Q3H PRN Zigmund Daniel., MD      . iopamidol (ISOVUE-300) 61 % injection           . labetalol (NORMODYNE,TRANDATE) injection 10 mg  10 mg Intravenous Q2H PRN Zigmund Daniel., MD      . lactated ringers infusion   Intravenous Continuous Sabas Sous, MD 125 mL/hr at 12/26/17 1054    . ondansetron (ZOFRAN) injection 4 mg  4 mg Intravenous Q6H PRN Zigmund Daniel., MD      . pantoprazole (PROTONIX) injection 40 mg  40 mg Intravenous Q12H Zigmund Daniel., MD      . sodium chloride 0.9 % injection            Current Outpatient Medications  Medication Sig Dispense Refill  . clindamycin (CLEOCIN T) 1 % external solution Apply 1 application topically 2 (two) times daily.  3  . cloNIDine (CATAPRES) 0.3 MG tablet Take 1 tablet (0.3 mg total) by mouth 2 (two) times daily. 180 tablet 3  . hydrALAZINE (APRESOLINE) 25 MG tablet Take 1 tablet (25 mg total) by mouth 2 (two) times daily. 180 tablet 3  . lisinopril-hydrochlorothiazide (PRINZIDE,ZESTORETIC) 20-25 MG tablet Take 1 tablet by mouth daily. 90 tablet 3  . sildenafil (VIAGRA) 100 MG tablet Take 0.5-1 tablets (50-100 mg total) by mouth daily as needed for erectile dysfunction. 30 tablet 11  . tretinoin (RETIN-A) 0.05 % cream Apply 1 application topically 2 (two) times daily as needed (wound healing).       Allergies as of 12/26/2017  . (No Known Allergies)     Review of Systems:    Constitutional: No weight loss    Skin: No rash Cardiovascular: No chest pain Respiratory: No SOB  Gastrointestinal: See HPI and otherwise negative Genitourinary: No dysuria Neurological: No headache, dizziness or syncope Musculoskeletal: No new muscle or joint pain Hematologic: No bleeding  Psychiatric: No history of depression or anxiety    Physical Exam:  Vital signs in last 24 hours: Temp:  [97.6 F (36.4 C)-99.1 F (37.3 C)] 97.6 F (36.4 C) (10/15 0952) Pulse Rate:  [59-92] 63 (10/15 1300) Resp:  [15-20] 20 (10/15 1230) BP: (158-203)/(83-124) 203/124 (10/15 1300) SpO2:  [99 %-100 %] 99 % (10/15 1300)   General:   Pleasant AA male appears to be in  acute distress, diaphoretic, Well developed, Well nourished, alert and cooperative Head:  Normocephalic and atraumatic. Eyes:   PEERL, EOMI. No icterus. Conjunctiva pink. Ears:  Normal auditory acuity. Neck:  Supple Throat: Oral cavity and pharynx without inflammation, swelling or lesion.  Lungs: Respirations even and unlabored. Lungs clear to auscultation bilaterally.   No wheezes, crackles, or rhonchi.  Heart: Normal S1, S2. No MRG. Regular rate and rhythm. No peripheral edema, cyanosis or pallor.  Abdomen:  Soft, nondistended, moderate epigastric ttp. No rebound or guarding. Normal bowel sounds. No appreciable masses or hepatomegaly. Rectal:  Not performed.  Msk:  Symmetrical without gross deformities. Peripheral pulses intact.  Extremities:  Without edema, no deformity or joint abnormality.  Neurologic:  Alert and  oriented x4;  grossly normal neurologically.  Skin:   Intact Psychiatric: Demonstrates good judgement and reason without abnormal affect or behaviors.   LAB RESULTS: Recent Labs    12/26/17 0849  WBC 11.7*  HGB 14.8  HCT 44.0  PLT 352   BMET Recent Labs    12/26/17 0849  NA 144  K 3.8  CL 105  CO2 22  GLUCOSE 171*  BUN 19  CREATININE 1.49*  CALCIUM 10.8*   LFT Recent Labs    12/26/17 0849  PROT 9.2*  ALBUMIN 4.9  AST 27   ALT 17  ALKPHOS 52  BILITOT 1.0   STUDIES: Ct Chest W Contrast  Result Date: 12/26/2017 CLINICAL DATA:  vomiting and abd pains that started this morning. Pt reports smoked marijuana this morning to try to help symptoms. " Pt is very clammy, sweaty. States he was running a fever this morning. Was fine yesterday, but woke up sick this morning. Pain all over. Whole body cramping. EXAM: CT CHEST, ABDOMEN, AND PELVIS WITH CONTRAST TECHNIQUE: Multidetector CT imaging of the chest, abdomen and pelvis was performed following the standard protocol during bolus administration of intravenous contrast. CONTRAST:  ISOVUE-300 IOPAMIDOL (ISOVUE-300) INJECTION 61% COMPARISON:  12/14/2015 and previous FINDINGS: CT CHEST FINDINGS Cardiovascular: Heart size normal. No pericardial effusion. No thoracic aortic aneurysm. Mediastinum/Nodes: No hilar or mediastinal adenopathy. Small hiatal hernia. No pneumomediastinum. Lungs/Pleura: No pleural effusion. No pneumothorax. Nodular appearance along the left diaphragmatic leaflet is stable since exams dating back to 01/06/2015. lungs clear. Musculoskeletal: No chest wall mass or suspicious bone lesions identified. CT ABDOMEN PELVIS FINDINGS Hepatobiliary: No focal liver abnormality is seen. No gallstones, gallbladder wall thickening, or biliary dilatation. Pancreas: Unremarkable. No pancreatic ductal dilatation or surrounding inflammatory changes. Spleen: Normal in size without focal abnormality. Adrenals/Urinary Tract: Adrenal glands are unremarkable. Kidneys are normal, without renal calculi, focal lesion, or hydronephrosis. Bladder is unremarkable. Stomach/Bowel: Small hiatal hernia. Stomach is nondistended. Small bowel decompressed. Normal appendix. The colon is nondilated, unremarkable. Vascular/Lymphatic: Scattered aortoiliac partially calcified plaque without aneurysm or stenosis. No abdominal or pelvic adenopathy. Reproductive: Prostatic enlargement. Other: Bilateral  pelvic phleboliths. No ascites. No free air. No evidence of hernia. Musculoskeletal: No acute or significant osseous findings. IMPRESSION: 1. No acute findings. 2. Small hiatal hernia. 3. Aortoiliac  atherosclerosis (ICD10-170.0). Electronically Signed   By: Corlis Leak M.D.   On: 12/26/2017 10:51   Ct Abdomen Pelvis W Contrast  Result Date: 12/26/2017 CLINICAL DATA:  vomiting and abd pains that started this morning. Pt reports smoked marijuana this morning to try to help symptoms. " Pt is very clammy, sweaty. States he was running a fever this morning. Was fine yesterday, but woke up sick this morning. Pain all over. Whole body cramping.  EXAM: CT CHEST, ABDOMEN, AND PELVIS WITH CONTRAST TECHNIQUE: Multidetector CT imaging of the chest, abdomen and pelvis was performed following the standard protocol during bolus administration of intravenous contrast. CONTRAST:  ISOVUE-300 IOPAMIDOL (ISOVUE-300) INJECTION 61% COMPARISON:  12/14/2015 and previous FINDINGS: CT CHEST FINDINGS Cardiovascular: Heart size normal. No pericardial effusion. No thoracic aortic aneurysm. Mediastinum/Nodes: No hilar or mediastinal adenopathy. Small hiatal hernia. No pneumomediastinum. Lungs/Pleura: No pleural effusion. No pneumothorax. Nodular appearance along the left diaphragmatic leaflet is stable since exams dating back to 01/06/2015. lungs clear. Musculoskeletal: No chest wall mass or suspicious bone lesions identified. CT ABDOMEN PELVIS FINDINGS Hepatobiliary: No focal liver abnormality is seen. No gallstones, gallbladder wall thickening, or biliary dilatation. Pancreas: Unremarkable. No pancreatic ductal dilatation or surrounding inflammatory changes. Spleen: Normal in size without focal abnormality. Adrenals/Urinary Tract: Adrenal glands are unremarkable. Kidneys are normal, without renal calculi, focal lesion, or hydronephrosis. Bladder is unremarkable. Stomach/Bowel: Small hiatal hernia. Stomach is nondistended. Small bowel  decompressed. Normal appendix. The colon is nondilated, unremarkable. Vascular/Lymphatic: Scattered aortoiliac partially calcified plaque without aneurysm or stenosis. No abdominal or pelvic adenopathy. Reproductive: Prostatic enlargement. Other: Bilateral pelvic phleboliths. No ascites. No free air. No evidence of hernia. Musculoskeletal: No acute or significant osseous findings. IMPRESSION: 1. No acute findings. 2. Small hiatal hernia. 3. Aortoiliac  atherosclerosis (ICD10-170.0). Electronically Signed   By: Corlis Leak M.D.   On: 12/26/2017 10:51   Dg Abd Acute W/chest  Result Date: 12/26/2017 CLINICAL DATA:  Epigastric pain EXAM: DG ABDOMEN ACUTE W/ 1V CHEST COMPARISON:  Chest 12/15/2015 FINDINGS: There is no evidence of dilated bowel loops or free intraperitoneal air. No radiopaque calculi or other significant radiographic abnormality is seen. Heart size and mediastinal contours are within normal limits. Both lungs are clear. IMPRESSION: Negative abdominal radiographs.  No acute cardiopulmonary disease. Electronically Signed   By: Marlan Palau M.D.   On: 12/26/2017 09:17    Impression / Plan:   Impression: 1.  Vomiting: 7 rounds this morning with nausea and 10/10 epigastric pain with diaphoresis, tachycardic, hypertensive, elevated lactic acid, CT chest and abdomen without abnormality; concern for esophageal perforation vs other 2.  Abdominal pain: epigastric, started with above 3.  History of Boerhaave syndrome: in Feb 2017, treated conservatively  Plan: 1. Agree with plans for esophagram with Gastrograffin 2. Per hospitalist, they may send this patient to [facility] due to acuity of situation 3. If patient is transferred, please alert our team there 4. Agree with anti-emetics scheduled; Reglan q6h and Zofran q8h and pain medicine prn, patient to remain NPO for now 5. No plans for immediate/emergent endoscopy 6. Please await any further recommendations from Dr. Rhea Belton later today  Thank you  for your kind consultation, we will continue to follow.  Violet Baldy Jenna Routzahn  12/26/2017, 1:56 PM

## 2017-12-26 NOTE — ED Notes (Signed)
Unable to obtain temperature due to active vomiting. Will attempt again shortly.

## 2017-12-26 NOTE — ED Notes (Signed)
Spoke with Toniann Fail, Pharmacologist, about changing the time for when the Zosyn is scheduled since his last dose was at 09:21am.

## 2017-12-26 NOTE — ED Notes (Signed)
Patient will not get into position for EKG to be placed. Says it is too uncomfortable.

## 2017-12-26 NOTE — ED Notes (Signed)
This nurse attempted to document Dilaudid waste at the end of shift, but since patient was transferred to [facility], I was unable access in pyxis. This RN and Paediatric nurse, RN witnessed waste of 0.5mg  of Dilaudid x2 in sink.   Madaline Guthrie, RN Gwen Pounds

## 2017-12-26 NOTE — ED Notes (Signed)
DR. Pilar Plate notified of patient lactic result of 5.03.

## 2017-12-26 NOTE — ED Notes (Signed)
Dr Bero notified of patient's lactic result. 

## 2017-12-26 NOTE — ED Notes (Signed)
Patient states he is unable to give a urine sample at this time

## 2017-12-26 NOTE — ED Triage Notes (Signed)
Pt reports that he has vomiting and abd pains that started this morning.  Pt reports smoked marijuana this morning to try to help symptoms.

## 2017-12-26 NOTE — Progress Notes (Signed)
Patient arrived to unit from [facility] ED. Patient is alert and oriented X4. Vital Signs 198/111 BP, 78 HR, 11 RR, O2 100%RA, Temp 98.4. Patient states is is not feeling nauseated at the moment. Patient states he is having chills. Skin is moist and intact. He has been placed on the monitor, CCMD notified. Patient has been oriented to room and equipment in room. He does not have family with him. He states his wife, Lafonda Mosses is in [place] at the present time. Her phone number  [phone]. I will continue to monitor patient

## 2017-12-26 NOTE — ED Notes (Signed)
ED TO INPATIENT HANDOFF REPORT  Name/Age/Gender Anthony Atkinson 47 y.o. male  Code Status Code Status History    Date Active Date Inactive Code Status Order ID Comments User Context   12/14/2015 1746 12/19/2015 1623 Full Code [id number]  [place], [place], MD ED   04/23/2015 2057 04/24/2015 2049 Full Code [id number]  Germain Osgood PA-C ED      Home/SNF/Other Home  Chief Complaint vomiting, chills   Level of Care/Admitting Diagnosis ED Disposition    ED Disposition Condition [facility] Area: [place] [100100]  Level of Care: Stepdown [14]  Diagnosis: Epigastric pain [782956]  Admitting Physician: Elodia Florence [[id number]]  Attending Physician: Cephus Slater, A CALDWELL [phone]  Estimated length of stay: past midnight tomorrow  Certification:: I certify this patient will need inpatient services for at least 2 midnights  PT Class (Do Not Modify): Inpatient [101]  PT Acc Code (Do Not Modify): Private [1]       Medical History Past Medical History:  Diagnosis Date  . Esophageal perforation   . Hidradenitis    Bilateral Axilla  . Hypertension     Allergies No Known Allergies  IV Location/Drains/Wounds Patient Lines/Drains/Airways Status   Active Line/Drains/Airways    Name:   Placement date:   Placement time:   Site:   Days:   Peripheral IV 12/26/17 Right Antecubital   12/26/17    0921    Antecubital   less than 1   Peripheral IV 12/26/17 Left Forearm   12/26/17    1600    Forearm   less than 1   Incision (Closed) 10/27/16 Axilla Other (Comment)   10/27/16    1218     425          Labs/Imaging Results for orders placed or performed during the hospital encounter of 12/26/17 (from the past 48 hour(s))  CBC     Status: Abnormal   Collection Time: 12/26/17  8:49 AM  Result Value Ref Range   WBC 11.7 (H) 4.0 - 10.5 K/uL   RBC 4.58 4.22 - 5.81 MIL/uL   Hemoglobin 14.8 13.0 - 17.0 g/dL   HCT 44.0 39.0 - 52.0 %   MCV 96.1  80.0 - 100.0 fL   MCH 32.3 26.0 - 34.0 pg   MCHC 33.6 30.0 - 36.0 g/dL   RDW 13.0 11.5 - 15.5 %   Platelets 352 150 - 400 K/uL   nRBC 0.0 0.0 - 0.2 %    Comment: Performed at [facility], [place] [address], [place], [place] [address]  Comprehensive metabolic panel     Status: Abnormal   Collection Time: 12/26/17  8:49 AM  Result Value Ref Range   Sodium 144 135 - 145 mmol/L   Potassium 3.8 3.5 - 5.1 mmol/L   Chloride 105 98 - 111 mmol/L   CO2 22 22 - 32 mmol/L   Glucose, Bld 171 (H) 70 - 99 mg/dL   BUN 19 6 - 20 mg/dL   Creatinine, Ser 1.49 (H) 0.61 - 1.24 mg/dL   Calcium 10.8 (H) 8.9 - 10.3 mg/dL   Total Protein 9.2 (H) 6.5 - 8.1 g/dL   Albumin 4.9 3.5 - 5.0 g/dL   AST 27 15 - 41 U/L   ALT 17 0 - 44 U/L   Alkaline Phosphatase 52 38 - 126 U/L   Total Bilirubin 1.0 0.3 - 1.2 mg/dL   GFR calc non Af Wyvonnia Lora  54 (L) >60 mL/min   GFR calc Af Amer >60 >60 mL/min    Comment: (NOTE) The eGFR has been calculated using the CKD EPI equation. This calculation has not been validated in all clinical situations. eGFR's persistently <60 mL/min signify possible Chronic Kidney Disease.    Anion gap 17 (H) 5 - 15    Comment: Performed at [facility], [place] [address] [place], [place] [address]  Lipase, blood     Status: None   Collection Time: 12/26/17  8:49 AM  Result Value Ref Range   Lipase 36 11 - 51 U/L    Comment: Performed at [facility], [place] [address], [place], [place] [address]  I-stat troponin, ED     Status: None   Collection Time: 12/26/17  8:56 AM  Result Value Ref Range   Troponin i, poc 0.00 0.00 - 0.08 ng/mL   Comment 3            Comment: Due to the release kinetics of cTnI, a negative result within the first hours of the onset of symptoms does not rule out myocardial infarction with certainty. If myocardial infarction is still suspected, repeat the test at appropriate intervals.   I-Stat CG4 Lactic Acid, ED      Status: Abnormal   Collection Time: 12/26/17  8:58 AM  Result Value Ref Range   Lactic Acid, Venous 5.03 (HH) 0.5 - 1.9 mmol/L   Comment NOTIFIED PHYSICIAN   Urinalysis, Routine w reflex microscopic     Status: Abnormal   Collection Time: 12/26/17 10:54 AM  Result Value Ref Range   Color, Urine YELLOW YELLOW   APPearance CLEAR CLEAR   Specific Gravity, Urine 1.039 (H) 1.005 - 1.030   pH 6.0 5.0 - 8.0   Glucose, UA 150 (A) NEGATIVE mg/dL   Hgb urine dipstick NEGATIVE NEGATIVE   Bilirubin Urine NEGATIVE NEGATIVE   Ketones, ur NEGATIVE NEGATIVE mg/dL   Protein, ur NEGATIVE NEGATIVE mg/dL   Nitrite NEGATIVE NEGATIVE   Leukocytes, UA NEGATIVE NEGATIVE    Comment: Performed at [place] [address] [place], [place] [address]  I-Stat CG4 Lactic Acid, ED     Status: Abnormal   Collection Time: 12/26/17 11:02 AM  Result Value Ref Range   Lactic Acid, Venous 3.85 (HH) 0.5 - 1.9 mmol/L   Comment NOTIFIED PHYSICIAN   Troponin I (q 6hr x 3)     Status: None   Collection Time: 12/26/17  1:28 PM  Result Value Ref Range   Troponin I <0.03 <0.03 ng/mL    Comment: Performed at [facility], [place] [address], [place], [place] [address]  Lactic acid, plasma     Status: Abnormal   Collection Time: 12/26/17  2:22 PM  Result Value Ref Range   Lactic Acid, Venous 2.8 (HH) 0.5 - 1.9 mmol/L    Comment: CRITICAL RESULT CALLED TO, READ BACK BY AND VERIFIED WITH: Milas Gain. RN @1630  ON 10.15.19 BY COHEN,K Performed at [facility], [place] [address] [place],  [address]    Ct Chest W Contrast  Result Date: 12/26/2017 CLINICAL DATA:  vomiting and abd pains that started this morning. Pt reports smoked marijuana this morning to try to help symptoms. " Pt is very clammy, sweaty. States he was running a fever this morning. Was fine yesterday, but woke up sick this morning. Pain all over. Whole body cramping. EXAM: CT CHEST, ABDOMEN, AND PELVIS  WITH CONTRAST TECHNIQUE: Multidetector  CT imaging of the chest, abdomen and pelvis was performed following the standard protocol during bolus administration of intravenous contrast. CONTRAST:  147m ISOVUE-300 IOPAMIDOL (ISOVUE-300) INJECTION 61% COMPARISON:  12/14/2015 and previous FINDINGS: CT CHEST FINDINGS Cardiovascular: Heart size normal. No pericardial effusion. No thoracic aortic aneurysm. Mediastinum/Nodes: No hilar or mediastinal adenopathy. Small hiatal hernia. No pneumomediastinum. Lungs/Pleura: No pleural effusion. No pneumothorax. Nodular appearance along the left diaphragmatic leaflet is stable since exams dating back to 01/06/2015. lungs clear. Musculoskeletal: No chest wall mass or suspicious bone lesions identified. CT ABDOMEN PELVIS FINDINGS Hepatobiliary: No focal liver abnormality is seen. No gallstones, gallbladder wall thickening, or biliary dilatation. Pancreas: Unremarkable. No pancreatic ductal dilatation or surrounding inflammatory changes. Spleen: Normal in size without focal abnormality. Adrenals/Urinary Tract: Adrenal glands are unremarkable. Kidneys are normal, without renal calculi, focal lesion, or hydronephrosis. Bladder is unremarkable. Stomach/Bowel: Small hiatal hernia. Stomach is nondistended. Small bowel decompressed. Normal appendix. The colon is nondilated, unremarkable. Vascular/Lymphatic: Scattered aortoiliac partially calcified plaque without aneurysm or stenosis. No abdominal or pelvic adenopathy. Reproductive: Prostatic enlargement. Other: Bilateral pelvic phleboliths. No ascites. No free air. No evidence of hernia. Musculoskeletal: No acute or significant osseous findings. IMPRESSION: 1. No acute findings. 2. Small hiatal hernia. 3. Aortoiliac  atherosclerosis (ICD10-170.0). Electronically Signed   By: DLucrezia EuropeM.D.   On: 12/26/2017 10:51   Ct Abdomen Pelvis W Contrast  Result Date: 12/26/2017 CLINICAL DATA:  vomiting and abd pains that started this morning. Pt  reports smoked marijuana this morning to try to help symptoms. " Pt is very clammy, sweaty. States he was running a fever this morning. Was fine yesterday, but woke up sick this morning. Pain all over. Whole body cramping. EXAM: CT CHEST, ABDOMEN, AND PELVIS WITH CONTRAST TECHNIQUE: Multidetector CT imaging of the chest, abdomen and pelvis was performed following the standard protocol during bolus administration of intravenous contrast. CONTRAST:  1049mISOVUE-300 IOPAMIDOL (ISOVUE-300) INJECTION 61% COMPARISON:  12/14/2015 and previous FINDINGS: CT CHEST FINDINGS Cardiovascular: Heart size normal. No pericardial effusion. No thoracic aortic aneurysm. Mediastinum/Nodes: No hilar or mediastinal adenopathy. Small hiatal hernia. No pneumomediastinum. Lungs/Pleura: No pleural effusion. No pneumothorax. Nodular appearance along the left diaphragmatic leaflet is stable since exams dating back to 01/06/2015. lungs clear. Musculoskeletal: No chest wall mass or suspicious bone lesions identified. CT ABDOMEN PELVIS FINDINGS Hepatobiliary: No focal liver abnormality is seen. No gallstones, gallbladder wall thickening, or biliary dilatation. Pancreas: Unremarkable. No pancreatic ductal dilatation or surrounding inflammatory changes. Spleen: Normal in size without focal abnormality. Adrenals/Urinary Tract: Adrenal glands are unremarkable. Kidneys are normal, without renal calculi, focal lesion, or hydronephrosis. Bladder is unremarkable. Stomach/Bowel: Small hiatal hernia. Stomach is nondistended. Small bowel decompressed. Normal appendix. The colon is nondilated, unremarkable. Vascular/Lymphatic: Scattered aortoiliac partially calcified plaque without aneurysm or stenosis. No abdominal or pelvic adenopathy. Reproductive: Prostatic enlargement. Other: Bilateral pelvic phleboliths. No ascites. No free air. No evidence of hernia. Musculoskeletal: No acute or significant osseous findings. IMPRESSION: 1. No acute findings. 2. Small  hiatal hernia. 3. Aortoiliac  atherosclerosis (ICD10-170.0). Electronically Signed   By: D Lucrezia Europe.D.   On: 12/26/2017 10:51   Dg Abd Acute W/chest  Result Date: 12/26/2017 CLINICAL DATA:  Epigastric pain EXAM: DG ABDOMEN ACUTE W/ 1V CHEST COMPARISON:  Chest 12/15/2015 FINDINGS: There is no evidence of dilated bowel loops or free intraperitoneal air. No radiopaque calculi or other significant radiographic abnormality is seen. Heart size and mediastinal contours are within normal limits. Both lungs are clear. IMPRESSION: Negative abdominal radiographs.  No acute cardiopulmonary disease. Electronically Signed   By: Franchot Gallo M.D.   On: 12/26/2017 09:17   Dg Esophagus W/water Sol Cm  Result Date: 12/26/2017 CLINICAL DATA:  47 year old male with history of violent emesis. Evaluate for esophageal perforation. Prior history of Boerhaave syndrome. EXAM: ESOPHOGRAM/BARIUM SWALLOW TECHNIQUE: Single contrast examination was performed using chest CT 12/17/2015. FLUOROSCOPY TIME:  Fluoroscopy Time:  30 seconds Radiation Exposure Index (if provided by the fluoroscopic device): 4.1 mGy COMPARISON:  12/17/2015. FINDINGS: Limited single contrast images of the esophagus demonstrate normal esophageal anatomy, without esophageal mass, stricture, esophageal ring or hiatal hernia. No extravasation of contrast material beyond the lumen of the esophagus was observed during the examination. IMPRESSION: 1. No findings to suggest esophageal perforation. Normal single contrast esophagram. Electronically Signed   By: Vinnie Langton M.D.   On: 12/26/2017 15:20   EKG Interpretation  Date/Time:  Tuesday December 26 2017 09:18:18 EDT Ventricular Rate:  58 PR Interval:    QRS Duration: 101 QT Interval:  456 QTC Calculation: 448 R Axis:   80 Text Interpretation:  Sinus rhythm Short PR interval Baseline wander in lead(s) II III V6 Confirmed by Gerlene Fee [phone]) on 12/26/2017 10:35:33 AM   Pending Labs Unresulted  Labs (From admission, onward)    Start     Ordered   12/26/17 1422  Lactic acid, plasma  STAT Now then every 3 hours,   R     12/26/17 1421   12/26/17 1328  Troponin I (q 6hr x 3)  Now then every 6 hours,   R     12/26/17 1327   12/26/17 0906  Blood culture (routine x 2)  BLOOD CULTURE X 2,   STAT     12/26/17 0905   Signed and Held  HIV antibody (Routine Testing)  Once,   R     Signed and Held   Signed and Held  Comprehensive metabolic panel  Tomorrow morning,   R     Signed and Held   Signed and Held  CBC  Tomorrow morning,   R     Signed and Held          Vitals/Pain Today's Vitals   12/26/17 1600 12/26/17 1606 12/26/17 1608 12/26/17 1630  BP: (!) 183/94 (!) 183/94  (!) 193/93  Pulse: 83   81  Resp: (!) 21   13  Temp:      TempSrc:      SpO2: 99%   99%  Weight:      Height:      PainSc:   5      Isolation Precautions No active isolations  Medications Medications  lactated ringers infusion ( Intravenous New Bag/Given 12/26/17 1054)  iopamidol (ISOVUE-300) 61 % injection (has no administration in time range)  sodium chloride 0.9 % injection (has no administration in time range)  ondansetron (ZOFRAN) injection 4 mg (has no administration in time range)  labetalol (NORMODYNE,TRANDATE) injection 10 mg (10 mg Intravenous Given 12/26/17 1357)  pantoprazole (PROTONIX) injection 40 mg (40 mg Intravenous Given 12/26/17 1357)  hydrALAZINE (APRESOLINE) injection 5 mg (5 mg Intravenous Given 12/26/17 1534)  HYDROmorphone (DILAUDID) injection 1 mg (1 mg Intravenous Given 12/26/17 1535)  metoCLOPramide (REGLAN) injection 5 mg (5 mg Intravenous Given 12/26/17 1535)  cloNIDine (CATAPRES - Dosed in mg/24 hr) patch 0.2 mg (0.2 mg Transdermal Patch Applied 12/26/17 1606)  piperacillin-tazobactam (ZOSYN) IVPB 3.375 g (has no administration in time range)  metoCLOPramide (REGLAN) injection 5 mg (has no  administration in time range)  ondansetron (ZOFRAN) 8 mg in sodium chloride 0.9 % 50  mL IVPB (has no administration in time range)  sodium chloride 0.9 % bolus 1,000 mL (0 mLs Intravenous Stopped 12/26/17 1415)  haloperidol lactate (HALDOL) injection 2 mg (2 mg Intravenous Given 12/26/17 0850)  sodium chloride 0.9 % bolus 1,000 mL (0 mLs Intravenous Stopped 12/26/17 1135)  HYDROmorphone (DILAUDID) injection 0.5 mg (0.5 mg Intravenous Given 12/26/17 0913)  piperacillin-tazobactam (ZOSYN) IVPB 3.375 g ( Intravenous Stopped 12/26/17 1020)  vancomycin (VANCOCIN) IVPB 1000 mg/200 mL premix (0 mg Intravenous Stopped 12/26/17 1106)  fluconazole (DIFLUCAN) IVPB 800 mg (800 mg Intravenous New Bag/Given 12/26/17 1136)  ondansetron (ZOFRAN) injection 4 mg (4 mg Intravenous Given 12/26/17 0943)  iopamidol (ISOVUE-300) 61 % injection 100 mL (100 mLs Intravenous Contrast Given 12/26/17 1019)  HYDROmorphone (DILAUDID) injection 0.5 mg (0.5 mg Intravenous Given 12/26/17 1151)    Mobility walks with person assist

## 2017-12-26 NOTE — ED Provider Notes (Signed)
[facility] Emergency Department Provider Note MRN:  [id number]  Arrival date & time: 12/26/17     Chief Complaint   Emesis and Chills   History of Present Illness   Anthony Atkinson is a 47 y.o. year-old male with a history of esophageal perforation presenting to the ED with chief complaint of emesis and chills.  Sudden onset nausea, vomiting this morning at 8 AM, associated with moderate sharp epigastric pain.  Diffuse diaphoresis as well.  Patient explains that this is happened before, feels similar to his esophageal perforation in the past.  No diarrhea, no recent fevers or illnesses.  No exacerbating or alleviating factors.  Pain is constant.  Review of Systems  A complete 10 system review of systems was obtained and all systems are negative except as noted in the HPI and PMH.   Patient's Health History    Past Medical History:  Diagnosis Date  . Esophageal perforation   . Hidradenitis    Bilateral Axilla  . Hypertension     Past Surgical History:  Procedure Laterality Date  . FACIAL COSMETIC SURGERY  11/2015   cysts removed d/t infected ingrown hairs "3 wks ago" per pt (asked on 12/15/15)  . HYDRADENITIS EXCISION Bilateral 10/27/2016   Procedure: WIDE EXCISION HIDRADENITIS BILATERAL AXILLA;  Surgeon: Abigail Miyamoto, MD;  Location: [facility] OR;  Service: General;  Laterality: Bilateral;    Family History  Problem Relation Age of Onset  . Colon cancer Neg Hx   . Esophageal cancer Neg Hx   . Pancreatic cancer Neg Hx   . Stomach cancer Neg Hx   . Liver disease Neg Hx     Social History   Socioeconomic History  . Marital status: Married    Spouse name: Not on file  . Number of children: Not on file  . Years of education: Not on file  . Highest education level: Not on file  Occupational History  . Not on file  Social Needs  . Financial resource strain: Not on file  . Food insecurity:    Worry: Not on file    Inability: Not on file  .  Transportation needs:    Medical: Not on file    Non-medical: Not on file  Tobacco Use  . Smoking status: Former Smoker    Packs/day: 0.50    Types: Cigarettes    Last attempt to quit: 12/14/2015    Years since quitting: 2.0  . Smokeless tobacco: Never Used  Substance and Sexual Activity  . Alcohol use: Yes    Comment: occasional  . Drug use: Yes    Types: Marijuana    Comment: 1/day per pt  . Sexual activity: Yes    Partners: Female    Birth control/protection: None  Lifestyle  . Physical activity:    Days per week: Not on file    Minutes per session: Not on file  . Stress: Not on file  Relationships  . Social connections:    Talks on phone: Not on file    Gets together: Not on file    Attends religious service: Not on file    Active member of club or organization: Not on file    Attends meetings of clubs or organizations: Not on file    Relationship status: Not on file  . Intimate partner violence:    Fear of current or ex partner: Not on file    Emotionally abused: Not on file    Physically abused: Not on  file    Forced sexual activity: Not on file  Other Topics Concern  . Not on file  Social History Narrative  . Not on file     Physical Exam  Vital Signs and Nursing Notes reviewed Vitals:   12/26/17 1358 12/26/17 1400  BP: (!) 195/129 (!) 196/122  Pulse: 65 78  Resp:  20  Temp:    SpO2: 98% 97%    CONSTITUTIONAL: Well-appearing, mild distress due to discomfort, diffusely diaphoretic NEURO:  Alert and oriented x 3, no focal deficits EYES:  eyes equal and reactive ENT/NECK:  no LAD, no JVD CARDIO: Regular rate, well-perfused, normal S1 and S2 PULM:  CTAB no wheezing or rhonchi GI/GU:  normal bowel sounds, non-distended, non-tender MSK/SPINE:  No gross deformities, no edema SKIN:  no rash, atraumatic PSYCH:  Appropriate speech and behavior  Diagnostic and Interventional Summary    EKG Interpretation  Date/Time:  Tuesday December 26 2017 09:18:18  EDT Ventricular Rate:  58 PR Interval:    QRS Duration: 101 QT Interval:  456 QTC Calculation: 448 R Axis:   80 Text Interpretation:  Sinus rhythm Short PR interval Baseline wander in lead(s) II III V6 Confirmed by Kennis Carina [phone]) on 12/26/2017 10:35:33 AM      Labs Reviewed  CBC - Abnormal; Notable for the following components:      Result Value   WBC 11.7 (*)    All other components within normal limits  COMPREHENSIVE METABOLIC PANEL - Abnormal; Notable for the following components:   Glucose, Bld 171 (*)    Creatinine, Ser 1.49 (*)    Calcium 10.8 (*)    Total Protein 9.2 (*)    GFR calc non Af Amer 54 (*)    Anion gap 17 (*)    All other components within normal limits  URINALYSIS, ROUTINE W REFLEX MICROSCOPIC - Abnormal; Notable for the following components:   Specific Gravity, Urine 1.039 (*)    Glucose, UA 150 (*)    All other components within normal limits  I-STAT CG4 LACTIC ACID, ED - Abnormal; Notable for the following components:   Lactic Acid, Venous 5.03 (*)    All other components within normal limits  I-STAT CG4 LACTIC ACID, ED - Abnormal; Notable for the following components:   Lactic Acid, Venous 3.85 (*)    All other components within normal limits  CULTURE, BLOOD (ROUTINE X 2)  CULTURE, BLOOD (ROUTINE X 2)  LIPASE, BLOOD  TROPONIN I  TROPONIN I  TROPONIN I  LACTIC ACID, PLASMA  LACTIC ACID, PLASMA  I-STAT TROPONIN, ED    DG Esophagus W/Water Sol CM  Final Result    CT Chest W Contrast  Final Result    CT ABDOMEN PELVIS W CONTRAST  Final Result    DG Abd Acute W/Chest  Final Result      Medications  lactated ringers infusion ( Intravenous New Bag/Given 12/26/17 1054)  iopamidol (ISOVUE-300) 61 % injection (has no administration in time range)  sodium chloride 0.9 % injection (has no administration in time range)  ondansetron (ZOFRAN) injection 4 mg (has no administration in time range)  labetalol (NORMODYNE,TRANDATE) injection 10  mg (10 mg Intravenous Given 12/26/17 1357)  pantoprazole (PROTONIX) injection 40 mg (40 mg Intravenous Given 12/26/17 1357)  hydrALAZINE (APRESOLINE) injection 5 mg (5 mg Intravenous Given 12/26/17 1534)  HYDROmorphone (DILAUDID) injection 1 mg (1 mg Intravenous Given 12/26/17 1535)  metoCLOPramide (REGLAN) injection 5 mg (5 mg Intravenous Given 12/26/17 1535)  cloNIDine (CATAPRES -  Dosed in mg/24 hr) patch 0.2 mg (has no administration in time range)  piperacillin-tazobactam (ZOSYN) IVPB 3.375 g (has no administration in time range)  sodium chloride 0.9 % bolus 1,000 mL (0 mLs Intravenous Stopped 12/26/17 1415)  haloperidol lactate (HALDOL) injection 2 mg (2 mg Intravenous Given 12/26/17 0850)  sodium chloride 0.9 % bolus 1,000 mL (0 mLs Intravenous Stopped 12/26/17 1135)  HYDROmorphone (DILAUDID) injection 0.5 mg (0.5 mg Intravenous Given 12/26/17 0913)  piperacillin-tazobactam (ZOSYN) IVPB 3.375 g ( Intravenous Stopped 12/26/17 1020)  vancomycin (VANCOCIN) IVPB 1000 mg/200 mL premix (0 mg Intravenous Stopped 12/26/17 1106)  fluconazole (DIFLUCAN) IVPB 800 mg (800 mg Intravenous New Bag/Given 12/26/17 1136)  ondansetron (ZOFRAN) injection 4 mg (4 mg Intravenous Given 12/26/17 0943)  iopamidol (ISOVUE-300) 61 % injection 100 mL (100 mLs Intravenous Contrast Given 12/26/17 1019)  HYDROmorphone (DILAUDID) injection 0.5 mg (0.5 mg Intravenous Given 12/26/17 1151)     Procedures Critical Care Critical Care Documentation Critical care time provided by me (excluding procedures): 42 minutes  Condition necessitating critical care: Concern for esophageal perforation, lactic acidosis  Components of critical care management: reviewing of prior records, laboratory and imaging interpretation, frequent re-examination and reassessment of vital signs, administration of IV fluid resuscitation, IV antibiotics, discussion with consulting services    ED Course and Medical Decision Making  I have reviewed  the triage vital signs and the nursing notes.  Pertinent labs & imaging results that were available during my care of the patient were reviewed by me and considered in my medical decision making (see below for details).  Concern for esophageal perforation in this 47 year old male with history of the same, will obtain IV access, fluid resuscitate, manage his pain and nausea, and likely obtain CT imaging.  CT imaging surprisingly with no acute process.  Still considering microperforation of the esophagus as the cause of patient's symptoms and lactic acidosis.  Case was discussed with cardiothoracic surgeon on-call, nothing more to be done with negative imaging at this time.  Admitted to hospitalist service for further care and evaluation.  Elmer Sow. Pilar Plate, MD [facility] Emergency Medicine [facility] [email or web address]  Final Clinical Impressions(s) / ED Diagnoses     ICD-10-CM   1. Lactic acidosis E87.2   2. Epigastric pain R10.13 DG Abd Acute W/Chest    DG Abd Acute W/Chest  3. History of esophageal disorder Z87.19 DG Esophagus W/Water Sol CM    DG Esophagus W/Water Sol CM    ED Discharge Orders    None         Sabas Sous, MD 12/26/17 [phone]

## 2017-12-26 NOTE — H&P (Signed)
History and Physical    [name]:[id number] DOB: 05-Jan-1971 DOA: 12/26/2017  PCP: Myrlene Broker, MD  Patient coming from: home  I have personally briefly reviewed patient's old medical records in [facility]  Chief Complaint: abdominal pain  HPI: Anthony Atkinson is Anthony Atkinson 47 y.o. male with medical history significant of esophageal perforation, hypertension presenting with nausea, vomiting, abdominal pain similar to previous episodes.   Patient notes around 530 this morning he had severe epigastric pain.  He describes this as Latricia Cerrito stabbing pressure.  He notes that it is constant is still present at around Worley Radermacher 5-6 out of 10 at the time of my exam.  He had 7 episodes of nausea and vomiting without blood.  He has Adylene Dlugosz history of multiple esophageal ruptures and notes that typically occurs after he has several episodes of nausea vomiting.  He notes episodes are similar to this 1.  He notes that he is tried to stop using marijuana before and notes that he is continued to have episodes even when he is done this.  He denies any fevers, cough, cold, chest pain, shortness of breath, dysuria, numbness, tingling, weakness.  He had to burgers last night.  He denies taking any over-the-counter meds.  He does note desire to take hot showers at times and notes that improves his symptoms sometimes.   ED Course: Labs, imaging, discussed with CT surgery (who note no need for surgery at this time).  Hospitalist to admit.   Review of Systems: As per HPI otherwise 10 point review of systems negative.   Past Medical History:  Diagnosis Date  . Esophageal perforation   . Hidradenitis    Bilateral Axilla  . Hypertension     Past Surgical History:  Procedure Laterality Date  . FACIAL COSMETIC SURGERY  11/2015   cysts removed d/t infected ingrown hairs "3 wks ago" per pt (asked on 12/15/15)  . HYDRADENITIS EXCISION Bilateral 10/27/2016   Procedure: WIDE EXCISION HIDRADENITIS BILATERAL AXILLA;   Surgeon: Abigail Miyamoto, MD;  Location: [facility];  Service: General;  Laterality: Bilateral;     reports that he quit smoking about 2 years ago. His smoking use included cigarettes. He smoked 0.50 packs per day. He has never used smokeless tobacco. He reports that he drinks alcohol. He reports that he has current or past drug history. Drug: Marijuana.  No Known Allergies  Family History  Problem Relation Age of Onset  . Colon cancer Neg Hx   . Esophageal cancer Neg Hx   . Pancreatic cancer Neg Hx   . Stomach cancer Neg Hx   . Liver disease Neg Hx    Prior to Admission medications   Medication Sig Start Date End Date Taking? Authorizing Provider  clindamycin (CLEOCIN T) 1 % external solution Apply 1 application topically 2 (two) times daily. 12/19/17  Yes [provider]  cloNIDine (CATAPRES) 0.3 MG tablet Take 1 tablet (0.3 mg total) by mouth 2 (two) times daily. 07/10/17  Yes Myrlene Broker, MD  hydrALAZINE (APRESOLINE) 25 MG tablet Take 1 tablet (25 mg total) by mouth 2 (two) times daily. 07/10/17  Yes Myrlene Broker, MD  lisinopril-hydrochlorothiazide (PRINZIDE,ZESTORETIC) 20-25 MG tablet Take 1 tablet by mouth daily. 07/10/17  Yes Myrlene Broker, MD  sildenafil (VIAGRA) 100 MG tablet Take 0.5-1 tablets (50-100 mg total) by mouth daily as needed for erectile dysfunction. 07/10/17  Yes Myrlene Broker, MD  tretinoin (RETIN-Gatsby Chismar) 0.05 % cream Apply 1 application topically  2 (two) times daily as needed (wound healing).  09/30/16  Yes [provider]    Physical Exam: Vitals:   12/26/17 1600 12/26/17 1606 12/26/17 1630 12/26/17 1738  BP: (!) 183/94 (!) 183/94 (!) 193/93 (!) 198/111  Pulse: 83  81 81  Resp: (!) 21  13 14   Temp:    98.4 F (36.9 C)  TempSrc:    Oral  SpO2: 99%  99% 100%  Weight:    90.7 kg  Height:    6\' 1"  (1.854 m)    Constitutional: NAD, calm, uncomfortable, diaphoretic Vitals:   12/26/17 1600 12/26/17 1606 12/26/17 1630  12/26/17 1738  BP: (!) 183/94 (!) 183/94 (!) 193/93 (!) 198/111  Pulse: 83  81 81  Resp: (!) 21  13 14   Temp:    98.4 F (36.9 C)  TempSrc:    Oral  SpO2: 99%  99% 100%  Weight:    90.7 kg  Height:    6\' 1"  (1.854 m)   Eyes: PERRL, lids and conjunctivae normal ENMT: Mucous membranes are moist.   Neck: normal, supple, no masses, no thyromegaly Respiratory: clear to auscultation bilaterally, no wheezing, no crackles. Normal respiratory effort. No accessory muscle use.  Cardiovascular: Regular rate and rhythm, no murmurs / rubs / gallops. No extremity edema. 2+ pedal pulses. No carotid bruits.  Abdomen: epigastric TTP, no masses palpated. No hepatosplenomegaly. Bowel sounds positive.  Musculoskeletal: no clubbing / cyanosis. No joint deformity upper and lower extremities. Good ROM, no contractures. Normal muscle tone.  Skin: no rashes, lesions, ulcers. No induration Neurologic: CN 2-12 grossly intact. Sensation intact. Strength 5/5 in all 4.  Psychiatric: Normal judgment and insight. Alert and oriented x 3. Normal mood.   Labs on Admission: I have personally reviewed following labs and imaging studies  CBC: Recent Labs  Lab 12/26/17 0849  WBC 11.7*  HGB 14.8  HCT 44.0  MCV 96.1  PLT 352   Basic Metabolic Panel: Recent Labs  Lab 12/26/17 0849  NA 144  K 3.8  CL 105  CO2 22  GLUCOSE 171*  BUN 19  CREATININE 1.49*  CALCIUM 10.8*   GFR: Estimated Creatinine Clearance: 68.5 mL/min (Kylen Schliep) (by C-G formula based on SCr of 1.49 mg/dL (H)). Liver Function Tests: Recent Labs  Lab 12/26/17 0849  AST 27  ALT 17  ALKPHOS 52  BILITOT 1.0  PROT 9.2*  ALBUMIN 4.9   Recent Labs  Lab 12/26/17 0849  LIPASE 36   No results for input(s): AMMONIA in the last 168 hours. Coagulation Profile: No results for input(s): INR, PROTIME in the last 168 hours. Cardiac Enzymes: Recent Labs  Lab 12/26/17 1328  TROPONINI <0.03   BNP (last 3 results) No results for input(s): PROBNP in  the last 8760 hours. HbA1C: No results for input(s): HGBA1C in the last 72 hours. CBG: No results for input(s): GLUCAP in the last 168 hours. Lipid Profile: No results for input(s): CHOL, HDL, LDLCALC, TRIG, CHOLHDL, LDLDIRECT in the last 72 hours. Thyroid Function Tests: No results for input(s): TSH, T4TOTAL, FREET4, T3FREE, THYROIDAB in the last 72 hours. Anemia Panel: No results for input(s): VITAMINB12, FOLATE, FERRITIN, TIBC, IRON, RETICCTPCT in the last 72 hours. Urine analysis:    Component Value Date/Time   COLORURINE YELLOW 12/26/2017 1054   APPEARANCEUR CLEAR 12/26/2017 1054   LABSPEC 1.039 (H) 12/26/2017 1054   PHURINE 6.0 12/26/2017 1054   GLUCOSEU 150 (Thinh Cuccaro) 12/26/2017 1054   HGBUR NEGATIVE 12/26/2017 1054   BILIRUBINUR NEGATIVE  12/26/2017 1054   KETONESUR NEGATIVE 12/26/2017 1054   PROTEINUR NEGATIVE 12/26/2017 1054   UROBILINOGEN 0.2 01/06/2015 0328   NITRITE NEGATIVE 12/26/2017 1054   LEUKOCYTESUR NEGATIVE 12/26/2017 1054    Radiological Exams on Admission: Ct Chest W Contrast  Result Date: 12/26/2017 CLINICAL DATA:  vomiting and abd pains that started this morning. Pt reports smoked marijuana this morning to try to help symptoms. " Pt is very clammy, sweaty. States he was running [place] Flax fever this morning. Was fine yesterday, but woke up sick this morning. Pain all over. Whole body cramping. EXAM: CT CHEST, ABDOMEN, AND PELVIS WITH CONTRAST TECHNIQUE: Multidetector CT imaging of the chest, abdomen and pelvis was performed following the standard protocol during bolus administration of intravenous contrast. CONTRAST:  ISOVUE-300 IOPAMIDOL (ISOVUE-300) INJECTION 61% COMPARISON:  12/14/2015 and previous FINDINGS: CT CHEST FINDINGS Cardiovascular: Heart size normal. No pericardial effusion. No thoracic aortic aneurysm. Mediastinum/Nodes: No hilar or mediastinal adenopathy. Small hiatal hernia. No pneumomediastinum. Lungs/Pleura: No pleural effusion. No pneumothorax. Nodular  appearance along the left diaphragmatic leaflet is stable since exams dating back to 01/06/2015. lungs clear. Musculoskeletal: No chest wall mass or suspicious bone lesions identified. CT ABDOMEN PELVIS FINDINGS Hepatobiliary: No focal liver abnormality is seen. No gallstones, gallbladder wall thickening, or biliary dilatation. Pancreas: Unremarkable. No pancreatic ductal dilatation or surrounding inflammatory changes. Spleen: Normal in size without focal abnormality. Adrenals/Urinary Tract: Adrenal glands are unremarkable. Kidneys are normal, without renal calculi, focal lesion, or hydronephrosis. Bladder is unremarkable. Stomach/Bowel: Small hiatal hernia. Stomach is nondistended. Small bowel decompressed. Normal appendix. The colon is nondilated, unremarkable. Vascular/Lymphatic: Scattered aortoiliac partially calcified plaque without aneurysm or stenosis. No abdominal or pelvic adenopathy. Reproductive: Prostatic enlargement. Other: Bilateral pelvic phleboliths. No ascites. No free air. No evidence of hernia. Musculoskeletal: No acute or significant osseous findings. IMPRESSION: 1. No acute findings. 2. Small hiatal hernia. 3. Aortoiliac  atherosclerosis (ICD10-170.0). Electronically Signed   By: Corlis Leak M.D.   On: 12/26/2017 10:51   Ct Abdomen Pelvis W Contrast  Result Date: 12/26/2017 CLINICAL DATA:  vomiting and abd pains that started this morning. Pt reports smoked marijuana this morning to try to help symptoms. " Pt is very clammy, sweaty. States he was running [place] Klas fever this morning. Was fine yesterday, but woke up sick this morning. Pain all over. Whole body cramping. EXAM: CT CHEST, ABDOMEN, AND PELVIS WITH CONTRAST TECHNIQUE: Multidetector CT imaging of the chest, abdomen and pelvis was performed following the standard protocol during bolus administration of intravenous contrast. CONTRAST:  ISOVUE-300 IOPAMIDOL (ISOVUE-300) INJECTION 61% COMPARISON:  12/14/2015 and previous FINDINGS: CT  CHEST FINDINGS Cardiovascular: Heart size normal. No pericardial effusion. No thoracic aortic aneurysm. Mediastinum/Nodes: No hilar or mediastinal adenopathy. Small hiatal hernia. No pneumomediastinum. Lungs/Pleura: No pleural effusion. No pneumothorax. Nodular appearance along the left diaphragmatic leaflet is stable since exams dating back to 01/06/2015. lungs clear. Musculoskeletal: No chest wall mass or suspicious bone lesions identified. CT ABDOMEN PELVIS FINDINGS Hepatobiliary: No focal liver abnormality is seen. No gallstones, gallbladder wall thickening, or biliary dilatation. Pancreas: Unremarkable. No pancreatic ductal dilatation or surrounding inflammatory changes. Spleen: Normal in size without focal abnormality. Adrenals/Urinary Tract: Adrenal glands are unremarkable. Kidneys are normal, without renal calculi, focal lesion, or hydronephrosis. Bladder is unremarkable. Stomach/Bowel: Small hiatal hernia. Stomach is nondistended. Small bowel decompressed. Normal appendix. The colon is nondilated, unremarkable. Vascular/Lymphatic: Scattered aortoiliac partially calcified plaque without aneurysm or stenosis. No abdominal or pelvic adenopathy. Reproductive: Prostatic enlargement. Other: Bilateral pelvic phleboliths. No ascites.  No free air. No evidence of hernia. Musculoskeletal: No acute or significant osseous findings. IMPRESSION: 1. No acute findings. 2. Small hiatal hernia. 3. Aortoiliac  atherosclerosis (ICD10-170.0). Electronically Signed   By: Corlis Leak M.D.   On: 12/26/2017 10:51   Dg Abd Acute W/chest  Result Date: 12/26/2017 CLINICAL DATA:  Epigastric pain EXAM: DG ABDOMEN ACUTE W/ 1V CHEST COMPARISON:  Chest 12/15/2015 FINDINGS: There is no evidence of dilated bowel loops or free intraperitoneal air. No radiopaque calculi or other significant radiographic abnormality is seen. Heart size and mediastinal contours are within normal limits. Both lungs are clear. IMPRESSION: Negative abdominal  radiographs.  No acute cardiopulmonary disease. Electronically Signed   By: Marlan Palau M.D.   On: 12/26/2017 09:17   Dg Esophagus W/water Sol Cm  Result Date: 12/26/2017 CLINICAL DATA:  47 year old male with history of violent emesis. Evaluate for esophageal perforation. Prior history of Boerhaave syndrome. EXAM: ESOPHOGRAM/BARIUM SWALLOW TECHNIQUE: Single contrast examination was performed using chest CT 12/17/2015. FLUOROSCOPY TIME:  Fluoroscopy Time:  30 seconds Radiation Exposure Index (if provided by the fluoroscopic device): 4.1 mGy COMPARISON:  12/17/2015. FINDINGS: Limited single contrast images of the esophagus demonstrate normal esophageal anatomy, without esophageal mass, stricture, esophageal ring or hiatal hernia. No extravasation of contrast material beyond the lumen of the esophagus was observed during the examination. IMPRESSION: 1. No findings to suggest esophageal perforation. Normal single contrast esophagram. Electronically Signed   By: Trudie Reed M.D.   On: 12/26/2017 15:20    EKG: Independently reviewed. NSR, first degree AV block.  Appears similar to priors.  Assessment/Plan Active Problems:   Epigastric pain  Epigastric Pain  Nausea  Vomiting History of esophageal rupture: Presents similarly to prior episodes when he's had episodes with esophageal rupture, per 2017 note, it had occurred at least 4 times.  With MJ use and desire to take hot showers with improvement in symptoms, question cannabinoid hyperemesis as component of these cycles of recurrent N/V?  CT chest abdomen pelvis without acute findings.  Esophagram negative. Pain control with dilaudid IV PPI NPO Scheduled zofran/reglan GI c/s, appreciate recs No role for CT surgery at this time with negative imaging, but given potential for decompensation, will transfer to [facility] troponins  Lactic Acidosis: 2/2 hypovolemia due to above.  Improving with IVF.  Follow.   Leukocytosis: initially received  vanc/zosyn/diflucan in ED with concern for rupture, will continue zosyn for now as he's still significantly symptomatic.  Narrow and deescalate as able.  Acute Kidney Injury: 2/2 hypovolemia due to above, follow UA and repeat creatine.  Imaging without hydro.  He did get contrast today in setting of aki.  Hypertension: Significantly elevated in setting of pain/no PO meds.  Pt NPO for now, holding home meds.  Will start clonidine patch.  IV meds prn.  Hypercalcemia: follow with IVF.  Improves when correcting for albumin.  Hiccups: follow with scheduled reglan for now, follow  DVT prophylaxis: SCD  Code Status: full  Family Communication: none at bedside  Disposition Plan: pending improvement  Consults called: GI  Admission status: inpatient with significant nausea/vomiting abdominal pain and history of similar presentations leading to esophageal rupture, expect he'll require at least 2 midnights for further evaluation and management   Lacretia Nicks MD [facility] Pager [phone]  If 7PM-7AM, please contact night-coverage [email or web address] Password [facility]  12/26/2017, 7:43 PM

## 2017-12-26 NOTE — ED Notes (Signed)
Patient transported to X-ray 

## 2017-12-26 NOTE — ED Notes (Signed)
Patient unable to sign transfer consent, but verbally consents to transfer to [facility]

## 2017-12-26 NOTE — Progress Notes (Signed)
Pharmacy Antibiotic Note  Anthony Atkinson is a 47 y.o. male admitted on 12/26/2017 with vomiting and abdominal pain Pharmacy has been consulted for Zosyn dosing due to concern for esophageal rupture.  Plan:  Zosyn 3.375g IV x 1 over 30 minutes given in the ED. Continue with Zosyn 3.375g IV q8h (each dose infused over 4 hours).   Monitor renal function,cultures, clinical course.      Temp (24hrs), Avg:98.4 F (36.9 C), Min:97.6 F (36.4 C), Max:99.1 F (37.3 C)  Recent Labs  Lab 12/26/17 0849 12/26/17 0858 12/26/17 1102  WBC 11.7*  --   --   CREATININE 1.49*  --   --   LATICACIDVEN  --  5.03* 3.85*    CrCl cannot be calculated (Unknown ideal weight.).    No Known Allergies  Antimicrobials this admission: 10/15 Fluconazole x 1 10/15 Vancomycin x 1 10/15 Zosyn >>  Dose adjustments this admission: --  Microbiology results: 10/15 BCx: sent 10/15 HIV antibody: ordered  Thank you for allowing pharmacy to be a part of this patient's care.   Greer Pickerel, PharmD, BCPS Pager: [phone] 12/26/2017 3:02 PM

## 2017-12-26 NOTE — ED Notes (Signed)
Spoke with wife, Lafonda Mosses on 2 occassions. Both times for an update and to let her know where he is going at [facility].

## 2017-12-27 ENCOUNTER — Inpatient Hospital Stay (HOSPITAL_COMMUNITY): Payer: 59

## 2017-12-27 DIAGNOSIS — I5030 Unspecified diastolic (congestive) heart failure: Secondary | ICD-10-CM

## 2017-12-27 DIAGNOSIS — I10 Essential (primary) hypertension: Secondary | ICD-10-CM

## 2017-12-27 DIAGNOSIS — N179 Acute kidney failure, unspecified: Secondary | ICD-10-CM

## 2017-12-27 DIAGNOSIS — D72829 Elevated white blood cell count, unspecified: Secondary | ICD-10-CM

## 2017-12-27 LAB — COMPREHENSIVE METABOLIC PANEL
ALBUMIN: 3.9 g/dL (ref 3.5–5.0)
ALK PHOS: 49 U/L (ref 38–126)
ALT: 14 U/L (ref 0–44)
AST: 23 U/L (ref 15–41)
Anion gap: 12 (ref 5–15)
BILIRUBIN TOTAL: 1.5 mg/dL — AB (ref 0.3–1.2)
BUN: 23 mg/dL — AB (ref 6–20)
CO2: 24 mmol/L (ref 22–32)
CREATININE: 1.66 mg/dL — AB (ref 0.61–1.24)
Calcium: 9.8 mg/dL (ref 8.9–10.3)
Chloride: 105 mmol/L (ref 98–111)
GFR calc Af Amer: 55 mL/min — ABNORMAL LOW (ref >60)
GFR, EST NON AFRICAN AMERICAN: 47 mL/min — AB (ref >60)
GLUCOSE: 126 mg/dL — AB (ref 70–99)
POTASSIUM: 3.4 mmol/L — AB (ref 3.5–5.1)
Sodium: 141 mmol/L (ref 135–145)
TOTAL PROTEIN: 7.8 g/dL (ref 6.5–8.1)

## 2017-12-27 LAB — CBC
HCT: 39.7 % (ref 39.0–52.0)
Hemoglobin: 13.5 g/dL (ref 13.0–17.0)
MCH: 32.4 pg (ref 26.0–34.0)
MCHC: 34 g/dL (ref 30.0–36.0)
MCV: 95.2 fL (ref 80.0–100.0)
Platelets: 320 10*3/uL (ref 150–400)
RBC: 4.17 MIL/uL — ABNORMAL LOW (ref 4.22–5.81)
RDW: 13.3 % (ref 11.5–15.5)
WBC: 19.1 10*3/uL — AB (ref 4.0–10.5)
nRBC: 0 % (ref 0.0–0.2)

## 2017-12-27 LAB — LACTIC ACID, PLASMA: LACTIC ACID, VENOUS: 2.5 mmol/L — AB (ref 0.5–1.9)

## 2017-12-27 LAB — TROPONIN I
TROPONIN I: 0.05 ng/mL — AB (ref <0.03)
Troponin I: 0.04 ng/mL (ref <0.03)

## 2017-12-27 LAB — ECHOCARDIOGRAM COMPLETE
Height: 73 in
WEIGHTICAEL: 3199.32 [oz_av]

## 2017-12-27 LAB — HIV ANTIBODY (ROUTINE TESTING W REFLEX): HIV Screen 4th Generation wRfx: NONREACTIVE

## 2017-12-27 LAB — MAGNESIUM: Magnesium: 1.7 mg/dL (ref 1.7–2.4)

## 2017-12-27 MED ORDER — PANTOPRAZOLE SODIUM 40 MG PO TBEC: 40.0000 mg | DELAYED_RELEASE_TABLET | Freq: Every day | ORAL | Status: DC

## 2017-12-27 MED ORDER — METOPROLOL TARTRATE 5 MG/5ML IV SOLN
5.0000 mg | Freq: Once | INTRAVENOUS | Status: AC
  Administered 2017-12-27: 5 mg via INTRAVENOUS
  Filled 2017-12-27: qty 5

## 2017-12-27 MED ORDER — POTASSIUM CHLORIDE IN NACL 40-0.9 MEQ/L-% IV SOLN
INTRAVENOUS | Status: DC
  Administered 2017-12-27 – 2017-12-28 (×3): 100 mL/h via INTRAVENOUS
  Filled 2017-12-27 (×4): qty 1000

## 2017-12-27 MED ORDER — SODIUM CHLORIDE 0.9 % IV SOLN
25.0000 mg | Freq: Three times a day (TID) | INTRAVENOUS | Status: DC | PRN
  Administered 2017-12-27 – 2017-12-28 (×4): 25 mg via INTRAVENOUS
  Filled 2017-12-27 (×4): qty 1

## 2017-12-27 MED ORDER — METOCLOPRAMIDE HCL 5 MG/ML IJ SOLN
10.0000 mg | Freq: Three times a day (TID) | INTRAMUSCULAR | Status: DC
  Administered 2017-12-27 – 2017-12-28 (×4): 10 mg via INTRAVENOUS
  Filled 2017-12-27 (×4): qty 2

## 2017-12-27 MED ORDER — HYDRALAZINE HCL 20 MG/ML IJ SOLN
5.0000 mg | INTRAMUSCULAR | Status: DC | PRN
  Administered 2017-12-27 (×3): 5 mg via INTRAVENOUS
  Filled 2017-12-27 (×3): qty 1

## 2017-12-27 NOTE — Progress Notes (Signed)
  Echocardiogram 2D Echocardiogram has been performed.  Delcie Roch 12/27/2017, 4:40 PM

## 2017-12-27 NOTE — Progress Notes (Addendum)
[facility] PROGRESS NOTE  Anthony Atkinson ZOX:[id number] DOB: 1970/11/30 DOA: 12/26/2017   PCP: Myrlene Broker, MD     Assessment/Plan: Principal Problem:   Epigastric pain Active Problems:   Nausea & vomiting   Essential hypertension   History of esophageal disorder   Marijuana use   Lactic acidosis   AKI (acute kidney injury) (HCC)  47 year old male with medical history significant for esophageal perforation , hypertension, presents with intractable nausea vomiting, abdominal/epigastric pain in the setting of marijuana use Noted to have lactic acidosis and leukocytosis. CT chest abdomen and pelvis was unremarkable. 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 Currently there is no evidence of esophageal tear or perforation on CT or fluoroscopy imaging Continue antibiotics, IV PPI CT chest abdomen pelvis without acute findings.  Esophagram negative. Pain control with dilaudid IV PPI Advance to clears by GI, advance diet as tolerated GI would like to defer endoscopy this admission    Lactic Acidosis: 2/2 hypovolemia due to above.  Improving with IVF.  Follow.   Leukocytosis: initially received vanc/zosyn/diflucan in ED with concern for rupture, will continue zosyn for now as he's still significantly symptomatic.  Narrow and deescalate as able.  Acute Kidney Injury: 2/2 hypovolemia due to above, follow UA and repeat creatine. Creatinine actually went up from 1.49 >1.66  Imaging without hydro.  patient also received contrast was CT scan yesterday IV fluids changed to normal saline  Hypertension: Significantly elevated in  setting of pain/no PO meds.  Pt  Started on clear liquid diet, holding home meds.  Will start clonidine patch.  added hydralazine prn  Hypercalcemia: follow with IVF.  Improves when correcting for albumin.  Hiccups: follow with scheduled reglan for now, follow  Abnormal troponin Marijuana occasionally to demand ischemia however EKG appears to be normal will obtain 2-D echo to rule out wall motion abnormalities     DVT prophylaxsis  SCDs  Code Status:  Full code    Family Communication: Discussed in detail with the patient, all imaging results, lab results explained to the patient   Disposition Plan:  Transfer to telemetry      Consultants:  gastroneurology  Procedures:   none  Antibiotics: Anti-infectives (From admission, onward)   Start     Dose/Rate Route Frequency Ordered Stop   12/26/17 1600  piperacillin-tazobactam (ZOSYN) IVPB 3.375 g     3.375 g 12.5 mL/hr over 240 Minutes Intravenous Every 8 hours 12/26/17 1501     12/26/17 0945  fluconazole (DIFLUCAN) IVPB 800 mg     800 mg 100 mL/hr over 240 Minutes Intravenous  Once 12/26/17 0931 12/26/17 1657   12/26/17 0930  vancomycin (VANCOCIN) IVPB 1000 mg/200 mL premix     1,000 mg 200 mL/hr over 60 Minutes Intravenous  Once 12/26/17 0931 12/26/17 1106   12/26/17 0915  piperacillin-tazobactam (ZOSYN) IVPB 4.5 g  Status:  Discontinued     4.5 g 200 mL/hr over 30 Minutes Intravenous  Once 12/26/17 0905 12/26/17 0907   12/26/17 0915  piperacillin-tazobactam (ZOSYN) IVPB 3.375 g  Status:  Discontinued     3.375 g 12.5 mL/hr over 240 Minutes Intravenous Every 8 hours 12/26/17 0909 12/26/17 0910  12/26/17 0915  piperacillin-tazobactam (ZOSYN) IVPB 3.375 g     3.375 g 12.5 mL/hr over 240 Minutes Intravenous  Once 12/26/17 0910 12/26/17 1020         HPI/Subjective: No further nausea vomiting today, no epigastric pain, no hematemesis, hematochezia or melena, noted to be hypertensive  Objective: Vitals:    12/26/17 2310 12/27/17 0330 12/27/17 0754 12/27/17 1158  BP: (!) 177/95 (!) 174/95 (!) 170/89 (!) 178/96  Pulse: 83 99 86 76  Resp: 15 15 18 18   Temp: 99 F (37.2 C) 98.5 F (36.9 C) 98.3 F (36.8 C) 98.2 F (36.8 C)  TempSrc: Oral Oral Oral Oral  SpO2: 99% 99% 97% 99%  Weight:      Height:        Intake/Output Summary (Last 24 hours) at 12/27/2017 1359 Last data filed at 12/27/2017 1209 Gross per 24 hour  Intake 2933.33 ml  Output 950 ml  Net 1983.33 ml    Exam:  Examination:  General exam: Appears calm and comfortable  Respiratory system: Clear to auscultation. Respiratory effort normal. Cardiovascular system: S1 & S2 heard, RRR. No JVD, murmurs, rubs, gallops or clicks. No pedal edema. Gastrointestinal system: Abdomen is nondistended, soft and nontender. No organomegaly or masses felt. Normal bowel sounds heard. Central nervous system: Alert and oriented. No focal neurological deficits. Extremities: Symmetric 5 x 5 power. Skin: No rashes, lesions or ulcers Psychiatry: Judgement and insight appear normal. Mood & affect appropriate.     Data Reviewed: I have personally reviewed following labs and imaging studies  Micro Results Recent Results (from the past 240 hour(s))  Blood culture (routine x 2)     Status: None (Preliminary result)   Collection Time: 12/26/17  9:20 AM  Result Value Ref Range Status   Specimen Description   Final    BLOOD LEFT ANTECUBITAL Performed at [facility], [address] [place], [place] [address]    Special Requests   Final    BOTTLES DRAWN AEROBIC AND ANAEROBIC Blood Culture adequate volume Performed at [facility], [address], [place], [place] [address]    Culture   Final    NO GROWTH < 24 HOURS Performed at [facility], [address] [place], [place] [address]    Report Status PENDING  Incomplete  Blood culture (routine x 2)     Status: None (Preliminary result)    Collection Time: 12/26/17  9:20 AM  Result Value Ref Range Status   Specimen Description   Final    BLOOD RIGHT ANTECUBITAL Performed at [facility], [address], [place], [place] [address]    Special Requests   Final    BOTTLES DRAWN AEROBIC AND ANAEROBIC Blood Culture results may not be optimal due to an excessive volume of blood received in culture bottles Performed at [facility], [address] [place], [place] [address]    Culture   Final    NO GROWTH < 24 HOURS Performed at [facility], [address], [place], [place] [address]    Report Status PENDING  Incomplete    Radiology Reports Ct Chest W Contrast  Result Date: 12/26/2017 CLINICAL DATA:  vomiting and abd pains that started this morning. Pt reports smoked marijuana this morning to try to help symptoms. " Pt is very clammy, sweaty. States he was running a fever this morning. Was fine yesterday, but woke up sick this morning. Pain all over. Whole body  cramping. EXAM: CT CHEST, ABDOMEN, AND PELVIS WITH CONTRAST TECHNIQUE: Multidetector CT imaging of the chest, abdomen and pelvis was performed following the standard protocol during bolus administration of intravenous contrast. CONTRAST:  ISOVUE-300 IOPAMIDOL (ISOVUE-300) INJECTION 61% COMPARISON:  12/14/2015 and previous FINDINGS: CT CHEST FINDINGS Cardiovascular: Heart size normal. No pericardial effusion. No thoracic aortic aneurysm. Mediastinum/Nodes: No hilar or mediastinal adenopathy. Small hiatal hernia. No pneumomediastinum. Lungs/Pleura: No pleural effusion. No pneumothorax. Nodular appearance along the left diaphragmatic leaflet is stable since exams dating back to 01/06/2015. lungs clear. Musculoskeletal: No chest wall mass or suspicious bone lesions identified. CT ABDOMEN PELVIS FINDINGS Hepatobiliary: No focal liver abnormality is seen. No gallstones, gallbladder wall thickening, or biliary dilatation. Pancreas:  Unremarkable. No pancreatic ductal dilatation or surrounding inflammatory changes. Spleen: Normal in size without focal abnormality. Adrenals/Urinary Tract: Adrenal glands are unremarkable. Kidneys are normal, without renal calculi, focal lesion, or hydronephrosis. Bladder is unremarkable. Stomach/Bowel: Small hiatal hernia. Stomach is nondistended. Small bowel decompressed. Normal appendix. The colon is nondilated, unremarkable. Vascular/Lymphatic: Scattered aortoiliac partially calcified plaque without aneurysm or stenosis. No abdominal or pelvic adenopathy. Reproductive: Prostatic enlargement. Other: Bilateral pelvic phleboliths. No ascites. No free air. No evidence of hernia. Musculoskeletal: No acute or significant osseous findings. IMPRESSION: 1. No acute findings. 2. Small hiatal hernia. 3. Aortoiliac  atherosclerosis (ICD10-170.0). Electronically Signed   By: Corlis Leak M.D.   On: 12/26/2017 10:51   Ct Abdomen Pelvis W Contrast  Result Date: 12/26/2017 CLINICAL DATA:  vomiting and abd pains that started this morning. Pt reports smoked marijuana this morning to try to help symptoms. " Pt is very clammy, sweaty. States he was running a fever this morning. Was fine yesterday, but woke up sick this morning. Pain all over. Whole body cramping. EXAM: CT CHEST, ABDOMEN, AND PELVIS WITH CONTRAST TECHNIQUE: Multidetector CT imaging of the chest, abdomen and pelvis was performed following the standard protocol during bolus administration of intravenous contrast. CONTRAST:  ISOVUE-300 IOPAMIDOL (ISOVUE-300) INJECTION 61% COMPARISON:  12/14/2015 and previous FINDINGS: CT CHEST FINDINGS Cardiovascular: Heart size normal. No pericardial effusion. No thoracic aortic aneurysm. Mediastinum/Nodes: No hilar or mediastinal adenopathy. Small hiatal hernia. No pneumomediastinum. Lungs/Pleura: No pleural effusion. No pneumothorax. Nodular appearance along the left diaphragmatic leaflet is stable since exams dating back  to 01/06/2015. lungs clear. Musculoskeletal: No chest wall mass or suspicious bone lesions identified. CT ABDOMEN PELVIS FINDINGS Hepatobiliary: No focal liver abnormality is seen. No gallstones, gallbladder wall thickening, or biliary dilatation. Pancreas: Unremarkable. No pancreatic ductal dilatation or surrounding inflammatory changes. Spleen: Normal in size without focal abnormality. Adrenals/Urinary Tract: Adrenal glands are unremarkable. Kidneys are normal, without renal calculi, focal lesion, or hydronephrosis. Bladder is unremarkable. Stomach/Bowel: Small hiatal hernia. Stomach is nondistended. Small bowel decompressed. Normal appendix. The colon is nondilated, unremarkable. Vascular/Lymphatic: Scattered aortoiliac partially calcified plaque without aneurysm or stenosis. No abdominal or pelvic adenopathy. Reproductive: Prostatic enlargement. Other: Bilateral pelvic phleboliths. No ascites. No free air. No evidence of hernia. Musculoskeletal: No acute or significant osseous findings. IMPRESSION: 1. No acute findings. 2. Small hiatal hernia. 3. Aortoiliac  atherosclerosis (ICD10-170.0). Electronically Signed   By: Corlis Leak M.D.   On: 12/26/2017 10:51   Dg Abd Acute W/chest  Result Date: 12/26/2017 CLINICAL DATA:  Epigastric pain EXAM: DG ABDOMEN ACUTE W/ 1V CHEST COMPARISON:  Chest 12/15/2015 FINDINGS: There is no evidence of dilated bowel loops or free intraperitoneal air. No radiopaque calculi or other significant radiographic abnormality is seen. Heart size and mediastinal contours are  within normal limits. Both lungs are clear. IMPRESSION: Negative abdominal radiographs.  No acute cardiopulmonary disease. Electronically Signed   By: Marlan Palau M.D.   On: 12/26/2017 09:17   Dg Esophagus W/water Sol Cm  Result Date: 12/26/2017 CLINICAL DATA:  47 year old male with history of violent emesis. Evaluate for esophageal perforation. Prior history of Boerhaave syndrome. EXAM: ESOPHOGRAM/BARIUM  SWALLOW TECHNIQUE: Single contrast examination was performed using chest CT 12/17/2015. FLUOROSCOPY TIME:  Fluoroscopy Time:  30 seconds Radiation Exposure Index (if provided by the fluoroscopic device): 4.1 mGy COMPARISON:  12/17/2015. FINDINGS: Limited single contrast images of the esophagus demonstrate normal esophageal anatomy, without esophageal mass, stricture, esophageal ring or hiatal hernia. No extravasation of contrast material beyond the lumen of the esophagus was observed during the examination. IMPRESSION: 1. No findings to suggest esophageal perforation. Normal single contrast esophagram. Electronically Signed   By: Trudie Reed M.D.   On: 12/26/2017 15:20     CBC Recent Labs  Lab 12/26/17 0849 12/27/17 0446  WBC 11.7* 19.1*  HGB 14.8 13.5  HCT 44.0 39.7  PLT 352 320  MCV 96.1 95.2  MCH 32.3 32.4  MCHC 33.6 34.0  RDW 13.0 13.3    Chemistries  Recent Labs  Lab 12/26/17 0849 12/27/17 0446 12/27/17 0828  NA 144 141  --   K 3.8 3.4*  --   CL 105 105  --   CO2 22 24  --   GLUCOSE 171* 126*  --   BUN 19 23*  --   CREATININE 1.49* 1.66*  --   CALCIUM 10.8* 9.8  --   MG  --   --  1.7  AST 27 23  --   ALT 17 14  --   ALKPHOS 52 49  --   BILITOT 1.0 1.5*  --    ------------------------------------------------------------------------------------------------------------------ estimated creatinine clearance is 61.5 mL/min (A) (by C-G formula based on SCr of 1.66 mg/dL (H)). ------------------------------------------------------------------------------------------------------------------ No results for input(s): HGBA1C in the last 72 hours. ------------------------------------------------------------------------------------------------------------------ No results for input(s): CHOL, HDL, LDLCALC, TRIG, CHOLHDL, LDLDIRECT in the last 72 hours. ------------------------------------------------------------------------------------------------------------------ No results  for input(s): TSH, T4TOTAL, T3FREE, THYROIDAB in the last 72 hours.  Invalid input(s): FREET3 ------------------------------------------------------------------------------------------------------------------ No results for input(s): VITAMINB12, FOLATE, FERRITIN, TIBC, IRON, RETICCTPCT in the last 72 hours.  Coagulation profile No results for input(s): INR, PROTIME in the last 168 hours.  No results for input(s): DDIMER in the last 72 hours.  Cardiac Enzymes Recent Labs  Lab 12/26/17 1328 12/27/17 0246 12/27/17 0828  TROPONINI <0.03 0.05* 0.04*   ------------------------------------------------------------------------------------------------------------------ Invalid input(s): POCBNP   CBG: No results for input(s): GLUCAP in the last 168 hours.     Studies: Ct Chest W Contrast  Result Date: 12/26/2017 CLINICAL DATA:  vomiting and abd pains that started this morning. Pt reports smoked marijuana this morning to try to help symptoms. " Pt is very clammy, sweaty. States he was running a fever this morning. Was fine yesterday, but woke up sick this morning. Pain all over. Whole body cramping. EXAM: CT CHEST, ABDOMEN, AND PELVIS WITH CONTRAST TECHNIQUE: Multidetector CT imaging of the chest, abdomen and pelvis was performed following the standard protocol during bolus administration of intravenous contrast. CONTRAST:  ISOVUE-300 IOPAMIDOL (ISOVUE-300) INJECTION 61% COMPARISON:  12/14/2015 and previous FINDINGS: CT CHEST FINDINGS Cardiovascular: Heart size normal. No pericardial effusion. No thoracic aortic aneurysm. Mediastinum/Nodes: No hilar or mediastinal adenopathy. Small hiatal hernia. No pneumomediastinum. Lungs/Pleura: No pleural effusion. No pneumothorax. Nodular appearance along the left  diaphragmatic leaflet is stable since exams dating back to 01/06/2015. lungs clear. Musculoskeletal: No chest wall mass or suspicious bone lesions identified. CT ABDOMEN PELVIS FINDINGS  Hepatobiliary: No focal liver abnormality is seen. No gallstones, gallbladder wall thickening, or biliary dilatation. Pancreas: Unremarkable. No pancreatic ductal dilatation or surrounding inflammatory changes. Spleen: Normal in size without focal abnormality. Adrenals/Urinary Tract: Adrenal glands are unremarkable. Kidneys are normal, without renal calculi, focal lesion, or hydronephrosis. Bladder is unremarkable. Stomach/Bowel: Small hiatal hernia. Stomach is nondistended. Small bowel decompressed. Normal appendix. The colon is nondilated, unremarkable. Vascular/Lymphatic: Scattered aortoiliac partially calcified plaque without aneurysm or stenosis. No abdominal or pelvic adenopathy. Reproductive: Prostatic enlargement. Other: Bilateral pelvic phleboliths. No ascites. No free air. No evidence of hernia. Musculoskeletal: No acute or significant osseous findings. IMPRESSION: 1. No acute findings. 2. Small hiatal hernia. 3. Aortoiliac  atherosclerosis (ICD10-170.0). Electronically Signed   By: Corlis Leak M.D.   On: 12/26/2017 10:51   Ct Abdomen Pelvis W Contrast  Result Date: 12/26/2017 CLINICAL DATA:  vomiting and abd pains that started this morning. Pt reports smoked marijuana this morning to try to help symptoms. " Pt is very clammy, sweaty. States he was running a fever this morning. Was fine yesterday, but woke up sick this morning. Pain all over. Whole body cramping. EXAM: CT CHEST, ABDOMEN, AND PELVIS WITH CONTRAST TECHNIQUE: Multidetector CT imaging of the chest, abdomen and pelvis was performed following the standard protocol during bolus administration of intravenous contrast. CONTRAST:  ISOVUE-300 IOPAMIDOL (ISOVUE-300) INJECTION 61% COMPARISON:  12/14/2015 and previous FINDINGS: CT CHEST FINDINGS Cardiovascular: Heart size normal. No pericardial effusion. No thoracic aortic aneurysm. Mediastinum/Nodes: No hilar or mediastinal adenopathy. Small hiatal hernia. No pneumomediastinum. Lungs/Pleura:  No pleural effusion. No pneumothorax. Nodular appearance along the left diaphragmatic leaflet is stable since exams dating back to 01/06/2015. lungs clear. Musculoskeletal: No chest wall mass or suspicious bone lesions identified. CT ABDOMEN PELVIS FINDINGS Hepatobiliary: No focal liver abnormality is seen. No gallstones, gallbladder wall thickening, or biliary dilatation. Pancreas: Unremarkable. No pancreatic ductal dilatation or surrounding inflammatory changes. Spleen: Normal in size without focal abnormality. Adrenals/Urinary Tract: Adrenal glands are unremarkable. Kidneys are normal, without renal calculi, focal lesion, or hydronephrosis. Bladder is unremarkable. Stomach/Bowel: Small hiatal hernia. Stomach is nondistended. Small bowel decompressed. Normal appendix. The colon is nondilated, unremarkable. Vascular/Lymphatic: Scattered aortoiliac partially calcified plaque without aneurysm or stenosis. No abdominal or pelvic adenopathy. Reproductive: Prostatic enlargement. Other: Bilateral pelvic phleboliths. No ascites. No free air. No evidence of hernia. Musculoskeletal: No acute or significant osseous findings. IMPRESSION: 1. No acute findings. 2. Small hiatal hernia. 3. Aortoiliac  atherosclerosis (ICD10-170.0). Electronically Signed   By: Corlis Leak M.D.   On: 12/26/2017 10:51   Dg Abd Acute W/chest  Result Date: 12/26/2017 CLINICAL DATA:  Epigastric pain EXAM: DG ABDOMEN ACUTE W/ 1V CHEST COMPARISON:  Chest 12/15/2015 FINDINGS: There is no evidence of dilated bowel loops or free intraperitoneal air. No radiopaque calculi or other significant radiographic abnormality is seen. Heart size and mediastinal contours are within normal limits. Both lungs are clear. IMPRESSION: Negative abdominal radiographs.  No acute cardiopulmonary disease. Electronically Signed   By: Marlan Palau M.D.   On: 12/26/2017 09:17   Dg Esophagus W/water Sol Cm  Result Date: 12/26/2017 CLINICAL DATA:  47 year old male with  history of violent emesis. Evaluate for esophageal perforation. Prior history of Boerhaave syndrome. EXAM: ESOPHOGRAM/BARIUM SWALLOW TECHNIQUE: Single contrast examination was performed using chest CT 12/17/2015. FLUOROSCOPY TIME:  Fluoroscopy Time:  30  seconds Radiation Exposure Index (if provided by the fluoroscopic device): 4.1 mGy COMPARISON:  12/17/2015. FINDINGS: Limited single contrast images of the esophagus demonstrate normal esophageal anatomy, without esophageal mass, stricture, esophageal ring or hiatal hernia. No extravasation of contrast material beyond the lumen of the esophagus was observed during the examination. IMPRESSION: 1. No findings to suggest esophageal perforation. Normal single contrast esophagram. Electronically Signed   By: Trudie Reed M.D.   On: 12/26/2017 15:20      No results found for: HGBA1C Lab Results  Component Value Date   LDLCALC 136 (H) 02/16/2016   CREATININE 1.66 (H) 12/27/2017       Scheduled Meds: . cloNIDine  0.2 mg Transdermal Weekly  . metoCLOPramide (REGLAN) injection  10 mg Intravenous Q6H  . pantoprazole (PROTONIX) IV  40 mg Intravenous Q12H   Continuous Infusions: . 0.9 % NaCl with KCl 40 mEq / L 100 mL/hr (12/27/17 1138)  . chlorproMAZINE (THORAZINE) IV 25 mg (12/27/17 1302)  . ondansetron [facility]) IV 8 mg (12/27/17 0511)  . piperacillin-tazobactam (ZOSYN)  IV 3.375 g (12/27/17 0756)     LOS: 1 day    Time spent: >30 MINS    Richarda Overlie  [facility] Pager [phone]. If 7PM-7AM, please contact night-coverage at [email or web address], password [facility] 12/27/2017, 1:59 PM  LOS: 1 day

## 2017-12-27 NOTE — Progress Notes (Addendum)
Daily Rounding Note  12/27/2017, 9:50 AM  LOS: 1 day   SUBJECTIVE:   Chief complaint: vomiting, epigastric pain.     No vomiting since earlier yesterday.  No nausea.  Last BM was Monday, norm ~ Qday to QOD   Hiccups now.  Thirsty, tolerating allowed ice chips No epigastric pain.    OBJECTIVE:         Vital signs in last 24 hours:    Temp:  [97.6 F (36.4 C)-99.6 F (37.6 C)] 98.3 F (36.8 C) (10/16 0754) Pulse Rate:  [63-99] 86 (10/16 0754) Resp:  [13-21] 18 (10/16 0754) BP: (164-204)/(83-129) 170/89 (10/16 0754) SpO2:  [97 %-100 %] 97 % (10/16 0754) Weight:  [90.7 kg] 90.7 kg (10/15 1738) Last BM Date: 12/25/17 Filed Weights   12/26/17 1536 12/26/17 1738  Weight: 90.7 kg 90.7 kg   General: looks well, NAD   Heart: RRR Chest: clear bil.   Abdomen: soft, NT, ND.  Active BS  Extremities: no CCE Neuro/Psych:  Oriented x 3, fully alert.  No weakness or deficits.    Intake/Output from previous day: 10/15 0701 - 10/16 0700 In: 4176.1 [I.V.:758.3; IV Piggyback:3417.8] Out: 450 [Urine:450]  Intake/Output this shift: No intake/output data recorded.  Lab Results: Recent Labs    12/26/17 0849 12/27/17 0446  WBC 11.7* 19.1*  HGB 14.8 13.5  HCT 44.0 39.7  PLT 352 320   BMET Recent Labs    12/26/17 0849 12/27/17 0446  NA 144 141  K 3.8 3.4*  CL 105 105  CO2 22 24  GLUCOSE 171* 126*  BUN 19 23*  CREATININE 1.49* 1.66*  CALCIUM 10.8* 9.8   LFT Recent Labs    12/26/17 0849 12/27/17 0446  PROT 9.2* 7.8  ALBUMIN 4.9 3.9  AST 27 23  ALT 17 14  ALKPHOS 52 49  BILITOT 1.0 1.5*   PT/INR No results for input(s): LABPROT, INR in the last 72 hours. Hepatitis Panel No results for input(s): HEPBSAG, HCVAB, HEPAIGM, HEPBIGM in the last 72 hours.  Studies/Results: Ct Chest W Contrast Ct Abdomen Pelvis W Contrast  Result Date: 12/26/2017 CLINICAL DATA:  vomiting and abd pains that started this  morning. Pt reports smoked marijuana this morning to try to help symptoms. " Pt is very clammy, sweaty. States he was running a fever this morning. Was fine yesterday, but woke up sick this morning. Pain all over. Whole body cramping. EXAM: CT CHEST, ABDOMEN, AND PELVIS WITH CONTRAST TECHNIQUE: Multidetector CT imaging of the chest, abdomen and pelvis was performed following the standard protocol during bolus administration of intravenous contrast. CONTRAST:  ISOVUE-300 IOPAMIDOL (ISOVUE-300) INJECTION 61% COMPARISON:  12/14/2015 and previous FINDINGS: CT CHEST FINDINGS Cardiovascular: Heart size normal. No pericardial effusion. No thoracic aortic aneurysm. Mediastinum/Nodes: No hilar or mediastinal adenopathy. Small hiatal hernia. No pneumomediastinum. Lungs/Pleura: No pleural effusion. No pneumothorax. Nodular appearance along the left diaphragmatic leaflet is stable since exams dating back to 01/06/2015. lungs clear. Musculoskeletal: No chest wall mass or suspicious bone lesions identified. CT ABDOMEN PELVIS FINDINGS Hepatobiliary: No focal liver abnormality is seen. No gallstones, gallbladder wall thickening, or biliary dilatation. Pancreas: Unremarkable. No pancreatic ductal dilatation or surrounding inflammatory changes. Spleen: Normal in size without focal abnormality. Adrenals/Urinary Tract: Adrenal glands are unremarkable. Kidneys are normal, without renal calculi, focal lesion, or hydronephrosis. Bladder is unremarkable. Stomach/Bowel: Small hiatal hernia. Stomach is nondistended. Small bowel decompressed. Normal appendix. The colon is nondilated, unremarkable. Vascular/Lymphatic: Scattered aortoiliac  partially calcified plaque without aneurysm or stenosis. No abdominal or pelvic adenopathy. Reproductive: Prostatic enlargement. Other: Bilateral pelvic phleboliths. No ascites. No free air. No evidence of hernia. Musculoskeletal: No acute or significant osseous findings. IMPRESSION: 1. No acute  findings. 2. Small hiatal hernia. 3. Aortoiliac  atherosclerosis (ICD10-170.0). Electronically Signed   By: Corlis Leak M.D.   On: 12/26/2017 10:51   Dg Abd Acute W/chest  Result Date: 12/26/2017 CLINICAL DATA:  Epigastric pain EXAM: DG ABDOMEN ACUTE W/ 1V CHEST COMPARISON:  Chest 12/15/2015 FINDINGS: There is no evidence of dilated bowel loops or free intraperitoneal air. No radiopaque calculi or other significant radiographic abnormality is seen. Heart size and mediastinal contours are within normal limits. Both lungs are clear. IMPRESSION: Negative abdominal radiographs.  No acute cardiopulmonary disease. Electronically Signed   By: Marlan Palau M.D.   On: 12/26/2017 09:17   Dg Esophagus W/water Sol Cm  Result Date: 12/26/2017 CLINICAL DATA:  47 year old male with history of violent emesis. Evaluate for esophageal perforation. Prior history of Boerhaave syndrome. EXAM: ESOPHOGRAM/BARIUM SWALLOW TECHNIQUE: Single contrast examination was performed using chest CT 12/17/2015. FLUOROSCOPY TIME:  Fluoroscopy Time:  30 seconds Radiation Exposure Index (if provided by the fluoroscopic device): 4.1 mGy COMPARISON:  12/17/2015. FINDINGS: Limited single contrast images of the esophagus demonstrate normal esophageal anatomy, without esophageal mass, stricture, esophageal ring or hiatal hernia. No extravasation of contrast material beyond the lumen of the esophagus was observed during the examination. IMPRESSION: 1. No findings to suggest esophageal perforation. Normal single contrast esophagram. Electronically Signed   By: Trudie Reed M.D.   On: 12/26/2017 15:20   Scheduled Meds: . cloNIDine  0.2 mg Transdermal Weekly  . metoCLOPramide (REGLAN) injection  10 mg Intravenous Q6H  . pantoprazole (PROTONIX) IV  40 mg Intravenous Q12H   Continuous Infusions: . chlorproMAZINE (THORAZINE) IV 25 mg (12/27/17 0430)  . lactated ringers 125 mL/hr at 12/27/17 0336  . ondansetron [facility]) IV 8 mg (12/27/17  0511)  . piperacillin-tazobactam (ZOSYN)  IV 3.375 g (12/27/17 0756)   PRN Meds:.chlorproMAZINE (THORAZINE) IV, hydrALAZINE, HYDROmorphone (DILAUDID) injection, labetalol, ondansetron (ZOFRAN) IV   ASSESMENT:   *  Acute/recurrent flare of cyclic N/V syndrome. No bloody emesis.  Abdominal pain.   Marijuana may be triggering, contributing to cyclic vomiting.   Hx recurrent Boerhaave syndrome, esophageal rupture managed medically.  No evidence of esophageal tear on CT or fluoro imaging Aggressive antiemetics in place: Q 6 hour metoclopramide, Q 8 hour Zofran.   IV Protonix BID in place.    *   Lactic acidosis.  Improved.    *   Leucocytosis, worsening.  No fever.  U/A and chest CT unremarkable yesterday 10/15, wonder about aspiration in setting of vomiting that has evolved since CT.   Zosyn, Vanc day 2.    *   Hypokalemia.    *   AKI.    *  Mild elevation of T bili, had this back in 12/2015.  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    *   Allow clears.    *  Potassium supplementation per Dr Susie Cassette.       Jennye Moccasin  12/27/2017, 9:50 AM Phone [phone]

## 2017-12-28 DIAGNOSIS — R112 Nausea with vomiting, unspecified: Secondary | ICD-10-CM

## 2017-12-28 DIAGNOSIS — F1290 Cannabis use, unspecified, uncomplicated: Secondary | ICD-10-CM

## 2017-12-28 DIAGNOSIS — D72829 Elevated white blood cell count, unspecified: Secondary | ICD-10-CM

## 2017-12-28 DIAGNOSIS — E872 Acidosis: Secondary | ICD-10-CM

## 2017-12-28 LAB — HEPATIC FUNCTION PANEL
ALBUMIN: 3.3 g/dL — AB (ref 3.5–5.0)
ALT: 13 U/L (ref 0–44)
AST: 20 U/L (ref 15–41)
Alkaline Phosphatase: 41 U/L (ref 38–126)
Bilirubin, Direct: 0.2 mg/dL (ref 0.0–0.2)
Indirect Bilirubin: 1.1 mg/dL — ABNORMAL HIGH (ref 0.3–0.9)
Total Bilirubin: 1.3 mg/dL — ABNORMAL HIGH (ref 0.3–1.2)
Total Protein: 6.5 g/dL (ref 6.5–8.1)

## 2017-12-28 LAB — CBC
HEMATOCRIT: 36.4 % — AB (ref 39.0–52.0)
HEMOGLOBIN: 12.3 g/dL — AB (ref 13.0–17.0)
MCH: 32.4 pg (ref 26.0–34.0)
MCHC: 33.8 g/dL (ref 30.0–36.0)
MCV: 95.8 fL (ref 80.0–100.0)
NRBC: 0 % (ref 0.0–0.2)
Platelets: 252 10*3/uL (ref 150–400)
RBC: 3.8 MIL/uL — ABNORMAL LOW (ref 4.22–5.81)
RDW: 13.3 % (ref 11.5–15.5)
WBC: 13.6 10*3/uL — ABNORMAL HIGH (ref 4.0–10.5)

## 2017-12-28 LAB — BASIC METABOLIC PANEL
Anion gap: 5 (ref 5–15)
BUN: 16 mg/dL (ref 6–20)
CHLORIDE: 104 mmol/L (ref 98–111)
CO2: 25 mmol/L (ref 22–32)
CREATININE: 1.21 mg/dL (ref 0.61–1.24)
Calcium: 8.6 mg/dL — ABNORMAL LOW (ref 8.9–10.3)
GFR calc Af Amer: >60 mL/min (ref >60)
GFR calc non Af Amer: >60 mL/min (ref >60)
Glucose, Bld: 107 mg/dL — ABNORMAL HIGH (ref 70–99)
POTASSIUM: 3.4 mmol/L — AB (ref 3.5–5.1)
Sodium: 134 mmol/L — ABNORMAL LOW (ref 135–145)

## 2017-12-28 MED ORDER — HYDROCHLOROTHIAZIDE 25 MG PO TABS
25.0000 mg | ORAL_TABLET | Freq: Every day | ORAL | Status: DC
  Administered 2017-12-28: 25 mg via ORAL
  Filled 2017-12-28: qty 1

## 2017-12-28 MED ORDER — AMOXICILLIN-POT CLAVULANATE 875-125 MG PO TABS: 1.0000 | ORAL_TABLET | Freq: Two times a day (BID) | ORAL | 0 refills | Status: AC

## 2017-12-28 MED ORDER — LISINOPRIL-HYDROCHLOROTHIAZIDE 20-25 MG PO TABS: 1.0000 | ORAL_TABLET | Freq: Every day | ORAL | Status: DC

## 2017-12-28 MED ORDER — METOCLOPRAMIDE HCL 10 MG PO TABS: 10.0000 mg | ORAL_TABLET | Freq: Three times a day (TID) | ORAL | 0 refills | Status: DC

## 2017-12-28 MED ORDER — CLONIDINE HCL 0.2 MG PO TABS
0.3000 mg | ORAL_TABLET | Freq: Two times a day (BID) | ORAL | Status: DC
  Administered 2017-12-28: 0.3 mg via ORAL
  Filled 2017-12-28: qty 1

## 2017-12-28 MED ORDER — LISINOPRIL 10 MG PO TABS
20.0000 mg | ORAL_TABLET | Freq: Every day | ORAL | Status: DC
  Administered 2017-12-28: 20 mg via ORAL
  Filled 2017-12-28: qty 2

## 2017-12-28 MED ORDER — HYDRALAZINE HCL 25 MG PO TABS
25.0000 mg | ORAL_TABLET | Freq: Two times a day (BID) | ORAL | Status: DC
  Administered 2017-12-28: 25 mg via ORAL
  Filled 2017-12-28: qty 1

## 2017-12-28 NOTE — Plan of Care (Signed)
  Problem: Education: Goal: Knowledge of General Education information will improve Description Including pain rating scale, medication(s)/side effects and non-pharmacologic comfort measures Outcome: Progressing   Problem: Health Behavior/Discharge Planning: Goal: Ability to manage health-related needs will improve Outcome: Progressing   Problem: Clinical Measurements: Goal: Will remain free from infection Outcome: Progressing   Problem: Elimination: Goal: Will not experience complications related to bowel motility Outcome: Progressing   Problem: Pain Managment: Goal: General experience of comfort will improve Outcome: Progressing   Problem: Elimination: Goal: Will not experience complications related to urinary retention Outcome: Completed/Met

## 2017-12-28 NOTE — Discharge Summary (Signed)
Physician Discharge Summary  Anthony Atkinson:[id number] DOB: [birth year] DOA: 12/26/2017  PCP: Myrlene Broker, MD  Admit date: 12/26/2017 Discharge date: 12/28/2017  Admitted From: Home Disposition: Home   Recommendations for Outpatient Follow-up:  1. Follow up with PCP in 1-2 weeks 2. Please obtain BMP/CBC in one week  Home Health: None Equipment/Devices: None Discharge Condition: Stable CODE STATUS: Full Diet recommendation: As tolerated.   Brief/Interim Summary: Anthony Atkinson is a 47 y.o. male with a history of cyclic nausea and vomiting syndrome, history of Boerhaave syndrome managed conservatively who presented with intractable nausea, vomiting, and epigastric pain reminiscent of previous episodes requiring hospitalization. He was found to be dehydrated with lactic acidosis, mild leukocytosis, and unremarkable CT chest, abdomen pelvis. GI was consulted and patient admitted for IV fluids, IV antiemetics, and monitoring. Esophagram was normal without evidence of rupture. Leukocytosis increased without localizing evidence of infection. Empiric antibiotics started with suspicion for early aspiration pneumonia. Clear diet was ultimately started and the patient improved with supportive care. Diet has been advanced without recurrence of nausea, abdominal pain or vomiting. GI signed off 10/16, advised to stop marijuana use.   Discharge Diagnoses:  Principal Problem:   Epigastric pain Active Problems:   Nausea & vomiting   Essential hypertension   History of esophageal disorder   Marijuana use   Lactic acidosis   AKI (acute kidney injury) (HCC)   Leukocytosis  Cyclic vomiting syndrome, intractable nausea and vomiting, history of esophageal rupture: Resolving. Esophagram reassuring, CT C/A/P reassuring.  - Advanced diet today with good tolerance, not requiring IV fluids at this time.  - Continue home antiemetics.  - Follow up with GI prn - Marijuana cessation advised    Lactic acidosis: Due to dehydration, improving.  Leukocytosis, possible aspiration pneumonia: Improved 19 > 13 with empiric antibiotics.  - Continue augmentin for total 5 days Tx.   AKI: Resolved.  Hypertension:Significantly elevated in setting of pain/no PO meds. Will restart home medications.   Hypercalcemia: Mild and resolved, likely due to hemoconcentration.   Hiccups: follow with scheduled reglan for now, follow. Currently resolved.   Demand ischemia: Troponin very mildly elevated 0.05 > 0.04, no chest pain or ischemic changes on ECG or wall motion abnormalities. Defer further work up.  Marijuana use:  - Cessation counseling  Discharge Instructions Discharge Instructions    Diet - low sodium heart healthy   Complete by:  As directed    Discharge instructions   Complete by:  As directed    Continue your medications for blood pressure and slowly advance your diet. For concern of aspiration which seems to be stable, you should complete a course of antibiotics with augmentin for 3 days. You can take reglan at mealtime to help with nausea/vomiting but will need to return for care if your symptoms return. It is recommended that you stop using marijuana.   Increase activity slowly   Complete by:  As directed      Allergies as of 12/28/2017   No Known Allergies     Medication List    TAKE these medications   amoxicillin-clavulanate 875-125 MG tablet Commonly known as:  AUGMENTIN Take 1 tablet by mouth every 12 (twelve) hours for 3 days.   clindamycin 1 % external solution Commonly known as:  CLEOCIN T Apply 1 application topically 2 (two) times daily.   cloNIDine 0.3 MG tablet Commonly known as:  CATAPRES Take 1 tablet (0.3 mg total) by mouth 2 (two) times daily.   hydrALAZINE  25 MG tablet Commonly known as:  APRESOLINE Take 1 tablet (25 mg total) by mouth 2 (two) times daily.   lisinopril-hydrochlorothiazide 20-25 MG tablet Commonly known as:   PRINZIDE,ZESTORETIC Take 1 tablet by mouth daily.   metoCLOPramide 10 MG tablet Commonly known as:  REGLAN Take 1 tablet (10 mg total) by mouth 3 (three) times daily before meals.   sildenafil 100 MG tablet Commonly known as:  VIAGRA Take 0.5-1 tablets (50-100 mg total) by mouth daily as needed for erectile dysfunction.   tretinoin 0.05 % cream Commonly known as:  RETIN-A Apply 1 application topically 2 (two) times daily as needed (wound healing).       No Known Allergies  Consultations:  GI  Procedures/Studies: Ct Chest W Contrast  Result Date: 12/26/2017 CLINICAL DATA:  vomiting and abd pains that started this morning. Pt reports smoked marijuana this morning to try to help symptoms. " Pt is very clammy, sweaty. States he was running a fever this morning. Was fine yesterday, but woke up sick this morning. Pain all over. Whole body cramping. EXAM: CT CHEST, ABDOMEN, AND PELVIS WITH CONTRAST TECHNIQUE: Multidetector CT imaging of the chest, abdomen and pelvis was performed following the standard protocol during bolus administration of intravenous contrast. CONTRAST:  ISOVUE-300 IOPAMIDOL (ISOVUE-300) INJECTION 61% COMPARISON:  12/14/2015 and previous FINDINGS: CT CHEST FINDINGS Cardiovascular: Heart size normal. No pericardial effusion. No thoracic aortic aneurysm. Mediastinum/Nodes: No hilar or mediastinal adenopathy. Small hiatal hernia. No pneumomediastinum. Lungs/Pleura: No pleural effusion. No pneumothorax. Nodular appearance along the left diaphragmatic leaflet is stable since exams dating back to 01/06/2015. lungs clear. Musculoskeletal: No chest wall mass or suspicious bone lesions identified. CT ABDOMEN PELVIS FINDINGS Hepatobiliary: No focal liver abnormality is seen. No gallstones, gallbladder wall thickening, or biliary dilatation. Pancreas: Unremarkable. No pancreatic ductal dilatation or surrounding inflammatory changes. Spleen: Normal in size without focal  abnormality. Adrenals/Urinary Tract: Adrenal glands are unremarkable. Kidneys are normal, without renal calculi, focal lesion, or hydronephrosis. Bladder is unremarkable. Stomach/Bowel: Small hiatal hernia. Stomach is nondistended. Small bowel decompressed. Normal appendix. The colon is nondilated, unremarkable. Vascular/Lymphatic: Scattered aortoiliac partially calcified plaque without aneurysm or stenosis. No abdominal or pelvic adenopathy. Reproductive: Prostatic enlargement. Other: Bilateral pelvic phleboliths. No ascites. No free air. No evidence of hernia. Musculoskeletal: No acute or significant osseous findings. IMPRESSION: 1. No acute findings. 2. Small hiatal hernia. 3. Aortoiliac  atherosclerosis (ICD10-170.0). Electronically Signed   By: Corlis Leak M.D.   On: 12/26/2017 10:51   Ct Abdomen Pelvis W Contrast  Result Date: 12/26/2017 CLINICAL DATA:  vomiting and abd pains that started this morning. Pt reports smoked marijuana this morning to try to help symptoms. " Pt is very clammy, sweaty. States he was running a fever this morning. Was fine yesterday, but woke up sick this morning. Pain all over. Whole body cramping. EXAM: CT CHEST, ABDOMEN, AND PELVIS WITH CONTRAST TECHNIQUE: Multidetector CT imaging of the chest, abdomen and pelvis was performed following the standard protocol during bolus administration of intravenous contrast. CONTRAST:  ISOVUE-300 IOPAMIDOL (ISOVUE-300) INJECTION 61% COMPARISON:  12/14/2015 and previous FINDINGS: CT CHEST FINDINGS Cardiovascular: Heart size normal. No pericardial effusion. No thoracic aortic aneurysm. Mediastinum/Nodes: No hilar or mediastinal adenopathy. Small hiatal hernia. No pneumomediastinum. Lungs/Pleura: No pleural effusion. No pneumothorax. Nodular appearance along the left diaphragmatic leaflet is stable since exams dating back to 01/06/2015. lungs clear. Musculoskeletal: No chest wall mass or suspicious bone lesions identified. CT ABDOMEN PELVIS  FINDINGS Hepatobiliary: No focal liver abnormality  is seen. No gallstones, gallbladder wall thickening, or biliary dilatation. Pancreas: Unremarkable. No pancreatic ductal dilatation or surrounding inflammatory changes. Spleen: Normal in size without focal abnormality. Adrenals/Urinary Tract: Adrenal glands are unremarkable. Kidneys are normal, without renal calculi, focal lesion, or hydronephrosis. Bladder is unremarkable. Stomach/Bowel: Small hiatal hernia. Stomach is nondistended. Small bowel decompressed. Normal appendix. The colon is nondilated, unremarkable. Vascular/Lymphatic: Scattered aortoiliac partially calcified plaque without aneurysm or stenosis. No abdominal or pelvic adenopathy. Reproductive: Prostatic enlargement. Other: Bilateral pelvic phleboliths. No ascites. No free air. No evidence of hernia. Musculoskeletal: No acute or significant osseous findings. IMPRESSION: 1. No acute findings. 2. Small hiatal hernia. 3. Aortoiliac  atherosclerosis (ICD10-170.0). Electronically Signed   By: Corlis Leak M.D.   On: 12/26/2017 10:51   Dg Abd Acute W/chest  Result Date: 12/26/2017 CLINICAL DATA:  Epigastric pain EXAM: DG ABDOMEN ACUTE W/ 1V CHEST COMPARISON:  Chest 12/15/2015 FINDINGS: There is no evidence of dilated bowel loops or free intraperitoneal air. No radiopaque calculi or other significant radiographic abnormality is seen. Heart size and mediastinal contours are within normal limits. Both lungs are clear. IMPRESSION: Negative abdominal radiographs.  No acute cardiopulmonary disease. Electronically Signed   By: Marlan Palau M.D.   On: 12/26/2017 09:17   Dg Esophagus W/water Sol Cm  Result Date: 12/26/2017 CLINICAL DATA:  47 year old male with history of violent emesis. Evaluate for esophageal perforation. Prior history of Boerhaave syndrome. EXAM: ESOPHOGRAM/BARIUM SWALLOW TECHNIQUE: Single contrast examination was performed using chest CT 12/17/2015. FLUOROSCOPY TIME:  Fluoroscopy Time:   30 seconds Radiation Exposure Index (if provided by the fluoroscopic device): 4.1 mGy COMPARISON:  12/17/2015. FINDINGS: Limited single contrast images of the esophagus demonstrate normal esophageal anatomy, without esophageal mass, stricture, esophageal ring or hiatal hernia. No extravasation of contrast material beyond the lumen of the esophagus was observed during the examination. IMPRESSION: 1. No findings to suggest esophageal perforation. Normal single contrast esophagram. Electronically Signed   By: Trudie Reed M.D.   On: 12/26/2017 15:20    Subjective: Feels well, ate complete breakfast no nausea or vomiting or abdominal pain. Hiccups seem to have improved. Lunch similarly went well.   Discharge Exam: Vitals:   12/28/17 0800 12/28/17 0957  BP: (!) 169/107 (!) 150/87  Pulse:  85  Resp: (!) 22 (!) 24  Temp:  97.8 F (36.6 C)  SpO2: 99% 97%   General: Pt is alert, awake, not in acute distress Cardiovascular: RRR, S1/S2 +, no rubs, no gallops Respiratory: CTA bilaterally, no wheezing, no rhonchi Abdominal: Soft, NT, ND, bowel sounds + Extremities: No edema, no cyanosis  Labs: BNP (last 3 results) No results for input(s): BNP in the last 8760 hours. Basic Metabolic Panel: Recent Labs  Lab 12/26/17 0849 12/27/17 0446 12/27/17 0828 12/28/17 0427  NA 144 141  --  134*  K 3.8 3.4*  --  3.4*  CL 105 105  --  104  CO2 22 24  --  25  GLUCOSE 171* 126*  --  107*  BUN 19 23*  --  16  CREATININE 1.49* 1.66*  --  1.21  CALCIUM 10.8* 9.8  --  8.6*  MG  --   --  1.7  --    Liver Function Tests: Recent Labs  Lab 12/26/17 0849 12/27/17 0446 12/28/17 0427  AST 27 23 20   ALT 17 14 13   ALKPHOS 52 49 41  BILITOT 1.0 1.5* 1.3*  PROT 9.2* 7.8 6.5  ALBUMIN 4.9 3.9 3.3*   Recent Labs  Lab 12/26/17 0849  LIPASE 36   No results for input(s): AMMONIA in the last 168 hours. CBC: Recent Labs  Lab 12/26/17 0849 12/27/17 0446 12/28/17 0427  WBC 11.7* 19.1* 13.6*  HGB  14.8 13.5 12.3*  HCT 44.0 39.7 36.4*  MCV 96.1 95.2 95.8  PLT 352 320 252   Cardiac Enzymes: Recent Labs  Lab 12/26/17 1328 12/27/17 0246 12/27/17 0828  TROPONINI <0.03 0.05* 0.04*   BNP: Invalid input(s): POCBNP CBG: No results for input(s): GLUCAP in the last 168 hours. D-Dimer No results for input(s): DDIMER in the last 72 hours. Hgb A1c No results for input(s): HGBA1C in the last 72 hours. Lipid Profile No results for input(s): CHOL, HDL, LDLCALC, TRIG, CHOLHDL, LDLDIRECT in the last 72 hours. Thyroid function studies No results for input(s): TSH, T4TOTAL, T3FREE, THYROIDAB in the last 72 hours.  Invalid input(s): FREET3 Anemia work up No results for input(s): VITAMINB12, FOLATE, FERRITIN, TIBC, IRON, RETICCTPCT in the last 72 hours. Urinalysis    Component Value Date/Time   COLORURINE YELLOW 12/26/2017 1054   APPEARANCEUR CLEAR 12/26/2017 1054   LABSPEC 1.039 (H) 12/26/2017 1054   PHURINE 6.0 12/26/2017 1054   GLUCOSEU 150 (A) 12/26/2017 1054   HGBUR NEGATIVE 12/26/2017 1054   BILIRUBINUR NEGATIVE 12/26/2017 1054   KETONESUR NEGATIVE 12/26/2017 1054   PROTEINUR NEGATIVE 12/26/2017 1054   UROBILINOGEN 0.2 01/06/2015 0328   NITRITE NEGATIVE 12/26/2017 1054   LEUKOCYTESUR NEGATIVE 12/26/2017 1054    Microbiology Recent Results (from the past 240 hour(s))  Blood culture (routine x 2)     Status: None (Preliminary result)   Collection Time: 12/26/17  9:20 AM  Result Value Ref Range Status   Specimen Description   Final    BLOOD LEFT ANTECUBITAL Performed at [facility], [address], [place], [place] [address]    Special Requests   Final    BOTTLES DRAWN AEROBIC AND ANAEROBIC Blood Culture adequate volume Performed at [facility], [address] [place], [place] [address]    Culture   Final    NO GROWTH 2 DAYS Performed at [facility], [address] [place], [place] [address]    Report Status PENDING   Incomplete  Blood culture (routine x 2)     Status: None (Preliminary result)   Collection Time: 12/26/17  9:20 AM  Result Value Ref Range Status   Specimen Description   Final    BLOOD RIGHT ANTECUBITAL Performed at [facility], [address], [place], [place] [address]    Special Requests   Final    BOTTLES DRAWN AEROBIC AND ANAEROBIC Blood Culture results may not be optimal due to an excessive volume of blood received in culture bottles Performed at [facility], [address] [place], [place] [address]    Culture   Final    NO GROWTH 2 DAYS Performed at [facility], [address], [place], [place] [address]    Report Status PENDING  Incomplete    Time coordinating discharge: Approximately 40 minutes  Tyrone Nine, MD  [facility] 12/28/2017, 2:09 PM Pager [phone]

## 2017-12-29 ENCOUNTER — Telehealth: Payer: Self-pay | Admitting: *Deleted

## 2017-12-29 NOTE — Telephone Encounter (Signed)
Tried to call pt to make hosp follow-up w/PCP for next 7 days. Pt has an appt scheduled for 01/09/18 for 6 month follow-up. Left msg on vm for pt to call back to make hosp f/u for next week. (sent CRM to [facility] for FYI..).Marland KitchenRaechel Chute

## 2017-12-29 NOTE — Telephone Encounter (Signed)
Pt called back and [facility] made hosp f/u appt. Called pt back to finish TCM call completed below.Anthony Atkinson  Transition Care Management Follow-up Telephone Call   Date discharged? 12/28/17   How have you been since you were released from the hospital? Pt states he is doing ok   Do you understand why you were in the hospital? YES   Do you understand the discharge instructions? YES   Where were you discharged to? Home   Items Reviewed:  Medications reviewed: YES  Allergies reviewed: YES  Dietary changes reviewed: NO  Referrals reviewed: No referral needed   Functional Questionnaire:   Activities of Daily Living (ADLs):   He states he are independent in the following: ambulation, bathing and hygiene, feeding, continence, grooming, toileting and dressing States he doesn't require assistance   Any transportation issues/concerns?: NO   Any patient concerns? NO   Confirmed importance and date/time of follow-up visits scheduled YES, appt 01/02/18  Provider Appointment booked with Dr. Okey Dupre  Confirmed with patient if condition begins to worsen call PCP or go to the ER.  Patient was given the office number and encouraged to call back with question or concerns.  : YES

## 2017-12-31 LAB — CULTURE, BLOOD (ROUTINE X 2)
Culture: NO GROWTH
Culture: NO GROWTH
SPECIAL REQUESTS: ADEQUATE

## 2018-01-02 ENCOUNTER — Ambulatory Visit (INDEPENDENT_AMBULATORY_CARE_PROVIDER_SITE_OTHER): Payer: 59 | Admitting: Internal Medicine

## 2018-01-02 ENCOUNTER — Encounter: Payer: Self-pay | Admitting: Internal Medicine

## 2018-01-02 DIAGNOSIS — I10 Essential (primary) hypertension: Secondary | ICD-10-CM | POA: Diagnosis not present

## 2018-01-02 DIAGNOSIS — R112 Nausea with vomiting, unspecified: Secondary | ICD-10-CM | POA: Diagnosis not present

## 2018-01-02 DIAGNOSIS — Z8719 Personal history of other diseases of the digestive system: Secondary | ICD-10-CM

## 2018-01-02 MED ORDER — ONDANSETRON HCL 4 MG PO TABS: 4.0000 mg | ORAL_TABLET | Freq: Three times a day (TID) | ORAL | 2 refills | Status: DC | PRN

## 2018-01-02 NOTE — Assessment & Plan Note (Signed)
No tear on exam during recent hospital stay. Reminded to stay away from marijuana to help avoid symptoms. Related to his vomiting.

## 2018-01-02 NOTE — Patient Instructions (Signed)
That reglan take for 2 weeks (3 times a day) and then stop and see if the nausea can stay away.

## 2018-01-02 NOTE — Assessment & Plan Note (Signed)
BP at goal on his clonidine and hydralazine and lisinopril/hctz. Recent BMP without indication for change.

## 2018-01-02 NOTE — Assessment & Plan Note (Signed)
Suspect related to marijuana usage and advised not to resume. He is taking reglan TID and advised him use for 2 weeks then try to stop. Refill on zofran for nausea.

## 2018-01-02 NOTE — Progress Notes (Signed)
   Subjective:    Patient ID: Anthony Atkinson, male    DOB: 11-23-1970, 47 y.o.   MRN: [id number]  HPI The patient is a 47 YO man coming in for hospital follow up (in for intractable nausea and vomiting, given reglan and zofran, they were concerned that marijuana usage could have caused, no esophageal tear found). He is doing well since leaving the hospital. Back at work and tired. Not sleeping well until last night. Feeling some better today. Denies nausea or vomiting. Almost out of zofran and wants refill. Taking reglan TID from the hospital.   Review of Systems  Constitutional: Positive for fatigue.  HENT: Negative.   Eyes: Negative.   Respiratory: Negative for cough, chest tightness and shortness of breath.   Cardiovascular: Negative for chest pain, palpitations and leg swelling.  Gastrointestinal: Negative for abdominal distention, abdominal pain, constipation, diarrhea, nausea and vomiting.  Musculoskeletal: Negative.   Skin: Negative.   Neurological: Negative.   Psychiatric/Behavioral: Positive for sleep disturbance.      Objective:   Physical Exam  Constitutional: He is oriented to person, place, and time. He appears well-developed and well-nourished.  HENT:  Head: Normocephalic and atraumatic.  Eyes: EOM are normal.  Neck: Normal range of motion.  Cardiovascular: Normal rate and regular rhythm.  Pulmonary/Chest: Effort normal and breath sounds normal. No respiratory distress. He has no wheezes. He has no rales.  Abdominal: Soft. Bowel sounds are normal. He exhibits no distension. There is no tenderness. There is no rebound.  Musculoskeletal: He exhibits no edema.  Neurological: He is alert and oriented to person, place, and time. Coordination normal.  Skin: Skin is warm and dry.  Psychiatric: He has a normal mood and affect.   Vitals:   01/02/18 1443  BP: 110/78  Pulse: (!) 56  Temp: 98.6 F (37 C)  TempSrc: Oral  SpO2: 97%  Weight: 207 lb (93.9 kg)  Height: 6'  1" (1.854 m)      Assessment & Plan:

## 2018-01-09 ENCOUNTER — Ambulatory Visit: Payer: 59 | Admitting: Internal Medicine

## 2018-05-16 ENCOUNTER — Other Ambulatory Visit: Payer: Self-pay | Admitting: Internal Medicine

## 2018-05-29 ENCOUNTER — Other Ambulatory Visit: Payer: Self-pay | Admitting: Internal Medicine

## 2018-05-29 DIAGNOSIS — I10 Essential (primary) hypertension: Secondary | ICD-10-CM

## 2018-07-05 ENCOUNTER — Other Ambulatory Visit: Payer: Self-pay | Admitting: Internal Medicine

## 2018-07-05 DIAGNOSIS — I10 Essential (primary) hypertension: Secondary | ICD-10-CM

## 2018-08-09 ENCOUNTER — Other Ambulatory Visit: Payer: Self-pay | Admitting: Internal Medicine

## 2018-11-02 ENCOUNTER — Other Ambulatory Visit: Payer: Self-pay | Admitting: Internal Medicine

## 2018-12-22 ENCOUNTER — Other Ambulatory Visit: Payer: Self-pay | Admitting: Internal Medicine

## 2018-12-22 DIAGNOSIS — I10 Essential (primary) hypertension: Secondary | ICD-10-CM

## 2018-12-23 ENCOUNTER — Other Ambulatory Visit: Payer: Self-pay | Admitting: Internal Medicine

## 2019-01-22 ENCOUNTER — Other Ambulatory Visit: Payer: Self-pay | Admitting: Internal Medicine

## 2019-01-29 ENCOUNTER — Other Ambulatory Visit: Payer: Self-pay | Admitting: Internal Medicine

## 2019-01-29 DIAGNOSIS — I10 Essential (primary) hypertension: Secondary | ICD-10-CM

## 2019-01-29 MED ORDER — HYDRALAZINE HCL 25 MG PO TABS: 25.0000 mg | ORAL_TABLET | Freq: Two times a day (BID) | ORAL | 1 refills | Status: DC

## 2019-01-29 NOTE — Telephone Encounter (Signed)
Medication refill: hydrALAZINE (APRESOLINE) 25 MG tablet [[id number]]    Pharmacy:  [facility] [phone] - [address] [place] AT [place] [phone] (Phone) [phone] (Fax)     Pt aware of turn around time.

## 2019-01-29 NOTE — Telephone Encounter (Signed)
Routing to CMA 

## 2019-01-29 NOTE — Telephone Encounter (Signed)
Requested medication (s) are due for refill today: yes  Requested medication (s) are on the active medication list: yes  Last refill:  05/29/2018  Future visit scheduled: no  Notes to clinic:  Overdue for office visit  Review for refill   Requested Prescriptions  Pending Prescriptions Disp Refills   hydrALAZINE (APRESOLINE) 25 MG tablet 180 tablet 1    Sig: Take 1 tablet (25 mg total) by mouth 2 (two) times daily.     Cardiovascular:  Vasodilators Failed - 01/29/2019  7:59 AM      Failed - HCT in normal range and within 360 days    HCT  Date Value Ref Range Status  12/28/2017 36.4 (L) 39.0 - 52.0 % Final         Failed - HGB in normal range and within 360 days    Hemoglobin  Date Value Ref Range Status  12/28/2017 12.3 (L) 13.0 - 17.0 g/dL Final         Failed - RBC in normal range and within 360 days    RBC  Date Value Ref Range Status  12/28/2017 3.80 (L) 4.22 - 5.81 MIL/uL Final         Failed - WBC in normal range and within 360 days    WBC  Date Value Ref Range Status  12/28/2017 13.6 (H) 4.0 - 10.5 K/uL Final         Failed - PLT in normal range and within 360 days    Platelets  Date Value Ref Range Status  12/28/2017 252 150 - 400 K/uL Final         Failed - Valid encounter within last 12 months    Recent Outpatient Visits          1 year ago Non-intractable vomiting with nausea, unspecified vomiting type   [place] [place], Elizabeth A, MD   1 year ago HTN (hypertension), benign   [facility] -Chuck Hint, MD   2 years ago Hidradenitis suppurativa   [place], Charlene Brooke, NP   2 years ago Abscess of axilla   [place], Charlene Brooke, NP   2 years ago Malignant hypertension   [place] [place], MD             Passed - Last BP in normal range    BP Readings from Last 1  Encounters:  01/02/18 110/78

## 2019-02-07 ENCOUNTER — Other Ambulatory Visit: Payer: Self-pay | Admitting: Internal Medicine

## 2019-02-14 ENCOUNTER — Other Ambulatory Visit: Payer: Self-pay | Admitting: Internal Medicine

## 2019-02-14 DIAGNOSIS — I10 Essential (primary) hypertension: Secondary | ICD-10-CM

## 2019-03-10 ENCOUNTER — Other Ambulatory Visit: Payer: Self-pay

## 2019-03-10 ENCOUNTER — Emergency Department (HOSPITAL_COMMUNITY)
Admission: EM | Admit: 2019-03-10 | Discharge: 2019-03-10 | Discharge status: Against Medical Advice | Payer: 59 | Attending: Emergency Medicine | Admitting: Emergency Medicine

## 2019-03-10 ENCOUNTER — Encounter (HOSPITAL_COMMUNITY): Payer: Self-pay | Admitting: Emergency Medicine

## 2019-03-10 DIAGNOSIS — Z5321 Procedure and treatment not carried out due to patient leaving prior to being seen by health care provider: Secondary | ICD-10-CM | POA: Diagnosis not present

## 2019-03-10 DIAGNOSIS — R112 Nausea with vomiting, unspecified: Secondary | ICD-10-CM | POA: Diagnosis present

## 2019-03-10 MED ORDER — ONDANSETRON 4 MG PO TBDP
4.0000 mg | ORAL_TABLET | Freq: Once | ORAL | Status: AC | PRN
  Administered 2019-03-10: 4 mg via ORAL
  Filled 2019-03-10: qty 1

## 2019-03-10 MED ORDER — SODIUM CHLORIDE 0.9% FLUSH: 3.0000 mL | Freq: Once | INTRAVENOUS | Status: DC

## 2019-03-10 NOTE — ED Notes (Signed)
Pt states he is leaving °

## 2019-03-10 NOTE — ED Notes (Signed)
Blood draw attempt x2 unsuccessful

## 2019-03-10 NOTE — ED Triage Notes (Addendum)
Pt here for eval of abominal pain, generalized, and has pain to his back and calves. States this happened before and he went to [facility] for this. Pt belching in triage non stop. Pt unable to sit still in triage.

## 2019-03-18 ENCOUNTER — Encounter: Payer: Self-pay | Admitting: Internal Medicine

## 2019-03-18 ENCOUNTER — Encounter (HOSPITAL_COMMUNITY): Payer: Self-pay | Admitting: Emergency Medicine

## 2019-03-18 ENCOUNTER — Other Ambulatory Visit: Payer: Self-pay

## 2019-03-18 ENCOUNTER — Ambulatory Visit (INDEPENDENT_AMBULATORY_CARE_PROVIDER_SITE_OTHER): Payer: 59 | Admitting: Internal Medicine

## 2019-03-18 VITALS — BP 100/68 | HR 98 | Temp 98.3°F | Resp 16 | Ht 73.0 in | Wt 179.0 lb

## 2019-03-18 DIAGNOSIS — S0181XA Laceration without foreign body of other part of head, initial encounter: Secondary | ICD-10-CM | POA: Insufficient documentation

## 2019-03-18 DIAGNOSIS — Y9389 Activity, other specified: Secondary | ICD-10-CM | POA: Insufficient documentation

## 2019-03-18 DIAGNOSIS — Z79899 Other long term (current) drug therapy: Secondary | ICD-10-CM | POA: Diagnosis not present

## 2019-03-18 DIAGNOSIS — R1013 Epigastric pain: Secondary | ICD-10-CM

## 2019-03-18 DIAGNOSIS — W01198A Fall on same level from slipping, tripping and stumbling with subsequent striking against other object, initial encounter: Secondary | ICD-10-CM | POA: Insufficient documentation

## 2019-03-18 DIAGNOSIS — K219 Gastro-esophageal reflux disease without esophagitis: Secondary | ICD-10-CM | POA: Diagnosis not present

## 2019-03-18 DIAGNOSIS — Y999 Unspecified external cause status: Secondary | ICD-10-CM | POA: Diagnosis not present

## 2019-03-18 DIAGNOSIS — I110 Hypertensive heart disease with heart failure: Secondary | ICD-10-CM | POA: Insufficient documentation

## 2019-03-18 DIAGNOSIS — I10 Essential (primary) hypertension: Secondary | ICD-10-CM

## 2019-03-18 DIAGNOSIS — R55 Syncope and collapse: Secondary | ICD-10-CM | POA: Insufficient documentation

## 2019-03-18 DIAGNOSIS — Y92008 Other place in unspecified non-institutional (private) residence as the place of occurrence of the external cause: Secondary | ICD-10-CM | POA: Diagnosis not present

## 2019-03-18 DIAGNOSIS — I5032 Chronic diastolic (congestive) heart failure: Secondary | ICD-10-CM | POA: Insufficient documentation

## 2019-03-18 DIAGNOSIS — Z87891 Personal history of nicotine dependence: Secondary | ICD-10-CM | POA: Insufficient documentation

## 2019-03-18 DIAGNOSIS — R7303 Prediabetes: Secondary | ICD-10-CM | POA: Insufficient documentation

## 2019-03-18 DIAGNOSIS — R739 Hyperglycemia, unspecified: Secondary | ICD-10-CM | POA: Diagnosis not present

## 2019-03-18 LAB — CBC WITH DIFFERENTIAL/PLATELET
Basophils Absolute: 0 10*3/uL (ref 0.0–0.1)
Basophils Relative: 0.5 % (ref 0.0–3.0)
Eosinophils Absolute: 0.1 10*3/uL (ref 0.0–0.7)
Eosinophils Relative: 0.9 % (ref 0.0–5.0)
HCT: 38.3 % — ABNORMAL LOW (ref 39.0–52.0)
Hemoglobin: 13.2 g/dL (ref 13.0–17.0)
Lymphocytes Relative: 28.1 % (ref 12.0–46.0)
Lymphs Abs: 2.3 10*3/uL (ref 0.7–4.0)
MCHC: 34.5 g/dL (ref 30.0–36.0)
MCV: 98.1 fl (ref 78.0–100.0)
Monocytes Absolute: 0.9 10*3/uL (ref 0.1–1.0)
Monocytes Relative: 11.1 % (ref 3.0–12.0)
Neutro Abs: 5 10*3/uL (ref 1.4–7.7)
Neutrophils Relative %: 59.4 % (ref 43.0–77.0)
Platelets: 442 10*3/uL — ABNORMAL HIGH (ref 150.0–400.0)
RBC: 3.91 Mil/uL — ABNORMAL LOW (ref 4.22–5.81)
RDW: 13.3 % (ref 11.5–15.5)
WBC: 8.3 10*3/uL (ref 4.0–10.5)

## 2019-03-18 LAB — BASIC METABOLIC PANEL
Anion gap: 14 (ref 5–15)
BUN: 49 mg/dL — ABNORMAL HIGH (ref 6–20)
CO2: 27 mmol/L (ref 22–32)
Calcium: 9.2 mg/dL (ref 8.9–10.3)
Chloride: 90 mmol/L — ABNORMAL LOW (ref 98–111)
Creatinine, Ser: 1.65 mg/dL — ABNORMAL HIGH (ref 0.61–1.24)
GFR calc Af Amer: 56 mL/min — ABNORMAL LOW (ref >60)
GFR calc non Af Amer: 48 mL/min — ABNORMAL LOW (ref >60)
Glucose, Bld: 126 mg/dL — ABNORMAL HIGH (ref 70–99)
Potassium: 3.7 mmol/L (ref 3.5–5.1)
Sodium: 131 mmol/L — ABNORMAL LOW (ref 135–145)

## 2019-03-18 LAB — COMPREHENSIVE METABOLIC PANEL
ALT: 25 U/L (ref 0–53)
AST: 18 U/L (ref 0–37)
Albumin: 4.2 g/dL (ref 3.5–5.2)
Alkaline Phosphatase: 45 U/L (ref 39–117)
BUN: 43 mg/dL — ABNORMAL HIGH (ref 6–23)
CO2: 32 mEq/L (ref 19–32)
Calcium: 9.9 mg/dL (ref 8.4–10.5)
Chloride: 89 mEq/L — ABNORMAL LOW (ref 96–112)
Creatinine, Ser: 1.8 mg/dL — ABNORMAL HIGH (ref 0.40–1.50)
GFR: 48.6 mL/min — ABNORMAL LOW (ref >60.00)
Glucose, Bld: 130 mg/dL — ABNORMAL HIGH (ref 70–99)
Potassium: 4 mEq/L (ref 3.5–5.1)
Sodium: 131 mEq/L — ABNORMAL LOW (ref 135–145)
Total Bilirubin: 0.6 mg/dL (ref 0.2–1.2)
Total Protein: 8.2 g/dL (ref 6.0–8.3)

## 2019-03-18 LAB — CBC
HCT: 38.6 % — ABNORMAL LOW (ref 39.0–52.0)
Hemoglobin: 13.4 g/dL (ref 13.0–17.0)
MCH: 32.6 pg (ref 26.0–34.0)
MCHC: 34.7 g/dL (ref 30.0–36.0)
MCV: 93.9 fL (ref 80.0–100.0)
Platelets: 446 10*3/uL — ABNORMAL HIGH (ref 150–400)
RBC: 4.11 MIL/uL — ABNORMAL LOW (ref 4.22–5.81)
RDW: 12.3 % (ref 11.5–15.5)
WBC: 9.9 10*3/uL (ref 4.0–10.5)
nRBC: 0 % (ref 0.0–0.2)

## 2019-03-18 LAB — HEMOGLOBIN A1C: Hgb A1c MFr Bld: 6.1 % (ref 4.6–6.5)

## 2019-03-18 LAB — CBG MONITORING, ED: Glucose-Capillary: 127 mg/dL — ABNORMAL HIGH (ref 70–99)

## 2019-03-18 MED ORDER — OMEPRAZOLE 20 MG PO CPDR: 20.0000 mg | DELAYED_RELEASE_CAPSULE | Freq: Every day | ORAL | 3 refills | Status: DC

## 2019-03-18 NOTE — Patient Instructions (Addendum)
  Blood work was ordered.     Medications reviewed and updated.  Changes include :   Start omeprazole for your heartburn - take daily for 2-4 weeks.    Hold your lisinopril-hctz for a couple of days.  Monitor your BP at home.  Restart the lisinopril-hctz when your BP becomes elevated.    Take miralax daily until you have a bowel movement.  This is over the counter.    Your prescription(s) have been submitted to your pharmacy. Please take as directed and contact our office if you believe you are having problem(s) with the medication(s).      Please call if there is no improvement in your symptoms.

## 2019-03-18 NOTE — Assessment & Plan Note (Signed)
Having GERD, epigastric discomfort over the past week or so Start omeprazole 20 mg daily-advised to take 2-4 weeks and at that point if symptoms are gone he can stop the medication and take as needed Continue bland diet, increase fluids-advance diet as tolerated

## 2019-03-18 NOTE — ED Triage Notes (Signed)
Pt reports that was seen at PCP and got medications for abd pains and acid reflux. After getting up from nap, when stood reports fell/passed out and cut right eyebrow with glasses. Denies having anything to eat or drink today besides water.

## 2019-03-18 NOTE — Assessment & Plan Note (Signed)
Epigastric discomfort, GERD Likely from nausea, vomiting Because of unknown-it has occurred in the past Possibly related to marijuana use Symptoms much improved so at this point we will just do symptomatic treatment until he is 100% If recurs may need to see GI again Omeprazole for GERD, epigastric discomfort CBC, CMP Call if no improvement

## 2019-03-18 NOTE — Progress Notes (Signed)
Subjective:    Patient ID: Anthony Atkinson, male    DOB: 10/18/1970, 49 y.o.   MRN: [id number]  HPI The patient is here for an acute visit.   His symptoms started Friday - 10 days ago.  He ate at [employer] - [employer].  That night he had the sweats.  The next morning he felt better.  Around 12 or 1pm then.  He felt warm and started vomiting.  Sat and Sunday he vomited intermittently.  Sunday he was dehydrated.  His wife put him on pedialyte and a BRAT diet.  He ate rice, soup, chicken, applesauce and bread.   Everything went back to normal.  One week ago he started having GERD, epiagstric pain and bloating.  He gets GERD when he eats warm food.  He has not had a BM in 10 days.  He is passing gas.  He has some upper abdominal discomfort, but denies pain.  Overall he is 85% better.  He had a similar episode a few years ago, 3-4 years ago.  He is unsure what the cause was at that time.  He does smoke marijuana, but states he smokes it on a regular basis and that is likely new.  The worry suggest him from his previous visit that that may have been a cause.  He is currently drinking sips of water and eating small portions.  He does not feel that he is ready to go back to work and needs a note for work.  He has started taking Tums for his GERD and that has helped a little.  He did not stop any of his blood pressure medications while he was feeling ill.      Medications and allergies reviewed with patient and updated if appropriate.  Patient Active Problem List   Diagnosis Date Noted  . Marijuana use 12/26/2017  . Cyclic vomiting syndrome   . History of esophageal disorder   . Chronic diastolic CHF (congestive heart failure) (HCC)   . Essential hypertension   . Panlobular emphysema (HCC)   . Nausea & vomiting   . 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 Tobacco abuse 02/13/2015    Current Outpatient Medications on File Prior to Visit  Medication Sig Dispense Refill  . clindamycin  (CLEOCIN T) 1 % external solution Apply 1 application topically 2 (two) times daily.  3  . cloNIDine (CATAPRES) 0.3 MG tablet Take 1 tablet (0.3 mg total) by mouth 2 (two) times daily. Needs office visit for further refills 180 tablet 0  . hydrALAZINE (APRESOLINE) 25 MG tablet Take 1 tablet (25 mg total) by mouth 2 (two) times daily. ** OFFICE VISIT DUE** 60 tablet 1  . lisinopril-hydrochlorothiazide (ZESTORETIC) 20-25 MG tablet TAKE 1 TABLET BY MOUTH  DAILY 10 tablet 0  . ondansetron (ZOFRAN) 4 MG tablet Take 1 tablet (4 mg total) by mouth every 8 (eight) hours as needed for nausea or vomiting. 60 tablet 2  . sildenafil (VIAGRA) 100 MG tablet TAKE ONE-HALF TO ONE TABLET BY MOUTH DAILY AS NEEDED  FOR ERECTILE DYSFUNCTION. 48 tablet 11  . tretinoin (RETIN-A) 0.05 % cream Apply 1 application topically 2 (two) times daily as needed (wound healing).      No current facility-administered medications on file prior to visit.    Past Medical History:  Diagnosis Date  . Esophageal perforation   . Hidradenitis    Bilateral Axilla  . Hypertension     Past Surgical History:  Procedure Laterality Date  .  FACIAL COSMETIC SURGERY  11/2015   cysts removed d/t infected ingrown hairs "3 wks ago" per pt (asked on 12/15/15)  . HYDRADENITIS EXCISION Bilateral 10/27/2016   Procedure: WIDE EXCISION HIDRADENITIS BILATERAL AXILLA;  Surgeon: Coralie Keens, MD;  Location: [facility];  Service: General;  Laterality: Bilateral;    Social History   Socioeconomic History  . Marital status: Married    Spouse name: Not on file  . Number of children: Not on file  . Years of education: Not on file  . Highest education level: Not on file  Occupational History  . Not on file  Tobacco Use  . Smoking status: Former Smoker    Packs/day: 0.50    Types: Cigarettes    Quit date: 12/14/2015    Years since quitting: 3.2  . Smokeless tobacco: Never Used  Substance and Sexual Activity  . Alcohol use: Yes    Comment:  occasional  . Drug use: Yes    Types: Marijuana    Comment: 1/day per pt  . Sexual activity: Yes    Partners: Female    Birth control/protection: None  Other Topics Concern  . Not on file  Social History Narrative  . Not on file   Social Determinants of Health   Financial Resource Strain:   . Difficulty of Paying Living Expenses: Not on file  Food Insecurity:   . Worried About Charity fundraiser in the Last Year: Not on file  . Ran Out of Food in the Last Year: Not on file  Transportation Needs:   . Lack of Transportation (Medical): Not on file  . Lack of Transportation (Non-Medical): Not on file  Physical Activity:   . Days of Exercise per Week: Not on file  . Minutes of Exercise per Session: Not on file  Stress:   . Feeling of Stress : Not on file  Social Connections:   . Frequency of Communication with Friends and Family: Not on file  . Frequency of Social Gatherings with Friends and Family: Not on file  . Attends Religious Services: Not on file  . Active Member of Clubs or Organizations: Not on file  . Attends Archivist Meetings: Not on file  . Marital Status: Not on file    Family History  Problem Relation Age of Onset  . Colon cancer Neg Hx   . Esophageal cancer Neg Hx   . Pancreatic cancer Neg Hx   . Stomach cancer Neg Hx   . Liver disease Neg Hx     Review of Systems  Constitutional: Positive for appetite change and diaphoresis. Negative for fever.  HENT: Negative for ear pain, sinus pain and sore throat.   Respiratory: Negative for cough, shortness of breath and wheezing.   Cardiovascular: Negative for chest pain and palpitations.  Gastrointestinal: Positive for abdominal distention, constipation, nausea and vomiting (over one week ago). Negative for abdominal pain and diarrhea.       GERD, bloating  Genitourinary: Negative for dysuria and hematuria.  Musculoskeletal: Positive for myalgias (cramping - from dehydration). Negative for  arthralgias.  Skin: Negative for rash.  Neurological: Positive for light-headedness (when he gets up fast only). Negative for dizziness and headaches.       Objective:   Vitals:   03/18/19 1002  BP: 100/68  Pulse: 98  Resp: 16  Temp: 98.3 F (36.8 C)  SpO2: 94%   BP Readings from Last 3 Encounters:  03/18/19 100/68  03/10/19 (!) 175/110  01/02/18 110/78  Wt Readings from Last 3 Encounters:  03/18/19 179 lb (81.2 kg)  01/02/18 207 lb (93.9 kg)  12/26/17 199 lb 15.3 oz (90.7 kg)   Body mass index is 23.62 kg/m.   Physical Exam Constitutional:      General: He is not in acute distress.    Appearance: Normal appearance. He is not ill-appearing.  HENT:     Head: Atraumatic.  Cardiovascular:     Rate and Rhythm: Normal rate and regular rhythm.     Heart sounds: No murmur.  Pulmonary:     Effort: Pulmonary effort is normal. No respiratory distress.     Breath sounds: Normal breath sounds. No wheezing or rales.  Abdominal:     General: There is no distension.     Palpations: Abdomen is soft. There is no mass.     Tenderness: There is no abdominal tenderness (discomfort in epigastric region w/ and w/o palpation). There is no guarding or rebound.     Hernia: No hernia is present.  Musculoskeletal:     Right lower leg: No edema.     Left lower leg: No edema.  Skin:    General: Skin is warm and dry.  Neurological:     Mental Status: He is alert.  Psychiatric:        Mood and Affect: Mood normal.        Behavior: Behavior normal.            Assessment & Plan:    See Problem List for Assessment and Plan of chronic medical problems.    This visit occurred during the SARS-CoV-2 public health emergency.  Safety protocols were in place, including screening questions prior to the visit, additional usage of staff PPE, and extensive cleaning of exam room while observing appropriate contact time as indicated for disinfecting solutions.

## 2019-03-18 NOTE — Assessment & Plan Note (Signed)
Blood pressure on the low side today-likely related to dehydration Continue increasing fluids Advise holding lisinopril-hydrochlorothiazide for 2 days and monitoring BP closely Continue other medications Restart lisinopril-hydrochlorothiazide when blood pressure starts to go up Likely still dehydrated-discussed the risk of staying on this medication during these episodes and that he should call in the future if something like this occurs so that we can advise him whether or not he should stop it CMP, CBC today

## 2019-03-18 NOTE — Assessment & Plan Note (Signed)
Sugars have been elevated in the past Doubt diabetes is the cause given history, but will check A1c

## 2019-03-19 ENCOUNTER — Emergency Department (HOSPITAL_COMMUNITY)
Admission: EM | Admit: 2019-03-19 | Discharge: 2019-03-19 | Disposition: A | Discharge status: Home / Self Care | Payer: 59 | Attending: Emergency Medicine | Admitting: Emergency Medicine

## 2019-03-19 DIAGNOSIS — R55 Syncope and collapse: Secondary | ICD-10-CM

## 2019-03-19 DIAGNOSIS — S0181XA Laceration without foreign body of other part of head, initial encounter: Secondary | ICD-10-CM

## 2019-03-19 DIAGNOSIS — R1013 Epigastric pain: Secondary | ICD-10-CM

## 2019-03-19 LAB — URINALYSIS, ROUTINE W REFLEX MICROSCOPIC
Bilirubin Urine: NEGATIVE
Glucose, UA: NEGATIVE mg/dL
Hgb urine dipstick: NEGATIVE
Ketones, ur: NEGATIVE mg/dL
Leukocytes,Ua: NEGATIVE
Nitrite: NEGATIVE
Protein, ur: NEGATIVE mg/dL
Specific Gravity, Urine: 1.02 (ref 1.005–1.030)
pH: 5 (ref 5.0–8.0)

## 2019-03-19 MED ORDER — LIDOCAINE-EPINEPHRINE (PF) 2 %-1:200000 IJ SOLN
10.0000 mL | Freq: Once | INTRAMUSCULAR | Status: AC
  Administered 2019-03-19: 10 mL
  Filled 2019-03-19: qty 10

## 2019-03-19 MED ORDER — ALUM & MAG HYDROXIDE-SIMETH 200-200-20 MG/5ML PO SUSP
30.0000 mL | Freq: Once | ORAL | Status: AC
  Administered 2019-03-19: 30 mL via ORAL
  Filled 2019-03-19: qty 30

## 2019-03-19 MED ORDER — LIDOCAINE VISCOUS HCL 2 % MT SOLN
15.0000 mL | Freq: Once | OROMUCOSAL | Status: AC
  Administered 2019-03-19: 15 mL via ORAL
  Filled 2019-03-19: qty 15

## 2019-03-19 NOTE — ED Notes (Signed)
Pt given water. Pt reports no difficulty drinking or holding down fluids

## 2019-03-19 NOTE — ED Notes (Signed)
PA at bedside. Pt made aware urine sample needed

## 2019-03-19 NOTE — Discharge Instructions (Signed)
Please read and follow all provided instructions.  Your diagnoses today include:  1. Syncope, unspecified syncope type   2. Epigastric pain   3. Facial laceration, initial encounter     Tests performed today include:  Blood counts and electrolytes  EKG -does not show any concerning findings  Vital signs. See below for your results today.   Medications prescribed:   None  Take any prescribed medications only as directed.   Home care instructions:  Follow any educational materials and wound care instructions contained in this packet.   Keep affected area above the level of your heart when possible to minimize swelling. Wash area gently twice a day with warm soapy water. Do not apply alcohol or hydrogen peroxide. Cover the area if it draining or weeping.   Follow-up instructions: Suture Removal: Return to the Emergency Department or see your primary care care doctor in 7 days for a recheck of your wound and removal of your sutures or staples.    Return instructions:  Return to the Emergency Department if you have:  Fever  Worsening pain  Worsening swelling of the wound  Pus draining from the wound  Redness of the skin that moves away from the wound, especially if it streaks away from the affected area   Return if you have additional episodes of passing out or any seizure-like activity  Return with chest pain or shortness of breath  Any other emergent concerns  Your vital signs today were: BP (!) 142/91   Pulse 79   Temp 98.4 F (36.9 C) (Oral)   Resp 16   Ht 6\' 1"  (1.854 m)   Wt 81.2 kg   SpO2 100%   BMI 23.62 kg/m  If your blood pressure (BP) was elevated above 135/85 this visit, please have this repeated by your doctor within one month. --------------

## 2019-03-19 NOTE — ED Notes (Addendum)
Pt verbalizes understanding of DC instructions. Pt belongings returned and is ambulatory out of ED.    Signature pad not availible 

## 2019-03-19 NOTE — ED Provider Notes (Signed)
[facility]-EMERGENCY DEPT Provider Note   CSN: [id number] Arrival date & time: 03/18/19  1316     History Chief Complaint  Patient presents with  . Loss of Consciousness  . Facial Laceration    Anthony Atkinson is a 49 y.o. male.  Patient with history of cyclic vomiting, and GERD --presents to the emergency department with complaint of syncope as well as loose teeth and a laceration above his right eye.  Patient states that he went to his doctor yesterday for "stomach pains".  Patient's blood pressure was low at his doctor's office thought likely related to dehydration.  He was instructed to hold his blood pressure medicines for 2 days.  Upon returning home the patient woke up from a nap.  He states that he was looking at some male when he woke up on the floor.  He denies any significant prodrome.  No seizure-like activity reported by family member who was nearby.  Patient sustained a laceration to his right forehead and also noted that some of his teeth were loose.  He states that he went to [facility] and was referred to ED for possible imaging.  Patient has been waiting in the emergency department for 17 hours with only complaint of "hunger pains".  He has not had any additional episodes of lightheadedness or syncope.        Past Medical History:  Diagnosis Date  . Esophageal perforation   . Hidradenitis    Bilateral Axilla  . Hypertension     Patient Active Problem List   Diagnosis Date Noted  . Hyperglycemia 03/18/2019  . Epigastric discomfort 03/18/2019  . Gastroesophageal reflux disease 03/18/2019  . Marijuana use 12/26/2017  . Cyclic vomiting syndrome   . History of esophageal disorder   . Chronic diastolic CHF (congestive heart failure) (HCC)   . Essential hypertension   . Panlobular emphysema (HCC)   . Nausea & vomiting   . 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 Tobacco abuse 02/13/2015    Past Surgical History:  Procedure  Laterality Date  . FACIAL COSMETIC SURGERY  11/2015   cysts removed d/t infected ingrown hairs "3 wks ago" per pt (asked on 12/15/15)  . HYDRADENITIS EXCISION Bilateral 10/27/2016   Procedure: WIDE EXCISION HIDRADENITIS BILATERAL AXILLA;  Surgeon: Abigail Miyamoto, MD;  Location: [facility] OR;  Service: General;  Laterality: Bilateral;       Family History  Problem Relation Age of Onset  . Colon cancer Neg Hx   . Esophageal cancer Neg Hx   . Pancreatic cancer Neg Hx   . Stomach cancer Neg Hx   . Liver disease Neg Hx     Social History   Tobacco Use  . Smoking status: Former Smoker    Packs/day: 0.50    Types: Cigarettes    Quit date: 12/14/2015    Years since quitting: 3.2  . Smokeless tobacco: Never Used  Substance Use Topics  . Alcohol use: Yes    Comment: occasional  . Drug use: Yes    Types: Marijuana    Comment: 1/day per pt    Home Medications Prior to Admission medications   Medication Sig Start Date End Date Taking? Authorizing Provider  clindamycin (CLEOCIN T) 1 % external solution Apply 1 application topically 2 (two) times daily. 12/19/17   [provider]  cloNIDine (CATAPRES) 0.3 MG tablet Take 1 tablet (0.3 mg total) by mouth 2 (two) times daily. Needs office visit for further refills 11/05/18  Myrlene Brokerrawford, Elizabeth A, MD  hydrALAZINE (APRESOLINE) 25 MG tablet Take 1 tablet (25 mg total) by mouth 2 (two) times daily. ** OFFICE VISIT DUE** 01/29/19   Myrlene Brokerrawford, Elizabeth A, MD  lisinopril-hydrochlorothiazide (ZESTORETIC) 20-25 MG tablet TAKE 1 TABLET BY MOUTH  DAILY 02/15/19   Myrlene Brokerrawford, Elizabeth A, MD  omeprazole (PRILOSEC) 20 MG capsule Take 1 capsule (20 mg total) by mouth daily. 03/18/19   Pincus SanesBurns, Stacy J, MD  ondansetron (ZOFRAN) 4 MG tablet Take 1 tablet (4 mg total) by mouth every 8 (eight) hours as needed for nausea or vomiting. 01/02/18   Myrlene Brokerrawford, Elizabeth A, MD  sildenafil (VIAGRA) 100 MG tablet TAKE ONE-HALF TO ONE TABLET BY MOUTH DAILY AS NEEDED  FOR  ERECTILE DYSFUNCTION. 12/24/18   Myrlene Brokerrawford, Elizabeth A, MD  tretinoin (RETIN-A) 0.05 % cream Apply 1 application topically 2 (two) times daily as needed (wound healing).  09/30/16   [provider]    Allergies    Patient has no known allergies.  Review of Systems   Review of Systems  Constitutional: Negative for fever.  HENT: Negative for rhinorrhea and sore throat.   Eyes: Negative for redness.  Respiratory: Negative for cough.   Cardiovascular: Negative for chest pain.  Gastrointestinal: Positive for abdominal pain. Negative for diarrhea, nausea and vomiting.  Genitourinary: Negative for dysuria.  Musculoskeletal: Negative for myalgias.  Skin: Positive for wound. Negative for rash.  Neurological: Positive for syncope. Negative for headaches.    Physical Exam Updated Vital Signs BP (!) 113/94   Pulse 84   Temp 98.4 F (36.9 C) (Oral)   Resp 16   Ht 6\' 1"  (1.854 m)   Wt 81.2 kg   SpO2 97%   BMI 23.62 kg/m   Physical Exam Vitals and nursing note reviewed.  Constitutional:      Appearance: He is well-developed.  HENT:     Head: Normocephalic. No raccoon eyes or Battle's sign.     Comments: Patient with 2 cm laceration over the right superior orbit, well approximated.  Full range of motion of jaw without point tenderness.  No point tenderness over the maxilla, zygoma, or around the orbits.  Patient has not developed any ecchymoses in these areas.    Right Ear: Tympanic membrane, ear canal and external ear normal. No hemotympanum.     Left Ear: Tympanic membrane, ear canal and external ear normal. No hemotympanum.     Nose: Nose normal.     Mouth/Throat:     Comments: Tooth #9 is loose but intact.  Eyes:     General: Lids are normal.        Right eye: No discharge.        Left eye: No discharge.     Conjunctiva/sclera: Conjunctivae normal.     Pupils: Pupils are equal, round, and reactive to light.     Comments: No visible hyphema  Cardiovascular:     Rate  and Rhythm: Normal rate and regular rhythm.     Heart sounds: Normal heart sounds.  Pulmonary:     Effort: Pulmonary effort is normal.     Breath sounds: Normal breath sounds.  Abdominal:     Palpations: Abdomen is soft.     Tenderness: There is no abdominal tenderness. There is no guarding or rebound.  Musculoskeletal:        General: Normal range of motion.     Cervical back: Normal range of motion and neck supple. No tenderness or bony tenderness.  Thoracic back: No tenderness or bony tenderness.     Lumbar back: No tenderness or bony tenderness.  Skin:    General: Skin is warm and dry.  Neurological:     Mental Status: He is alert and oriented to person, place, and time.     GCS: GCS eye subscore is 4. GCS verbal subscore is 5. GCS motor subscore is 6.     Cranial Nerves: No cranial nerve deficit.     Sensory: No sensory deficit.     Coordination: Coordination normal.     Deep Tendon Reflexes: Reflexes are normal and symmetric.     ED Results / Procedures / Treatments   Labs (all labs ordered are listed, but only abnormal results are displayed) Labs Reviewed  BASIC METABOLIC PANEL - Abnormal; Notable for the following components:      Result Value   Sodium 131 (*)    Chloride 90 (*)    Glucose, Bld 126 (*)    BUN 49 (*)    Creatinine, Ser 1.65 (*)    GFR calc non Af Amer 48 (*)    GFR calc Af Amer 56 (*)    All other components within normal limits  CBC - Abnormal; Notable for the following components:   RBC 4.11 (*)    HCT 38.6 (*)    Platelets 446 (*)    All other components within normal limits  CBG MONITORING, ED - Abnormal; Notable for the following components:   Glucose-Capillary 127 (*)    All other components within normal limits  URINALYSIS, ROUTINE W REFLEX MICROSCOPIC    ED ECG REPORT   Date: 03/19/2019  Rate: 84  Rhythm: normal sinus rhythm  QRS Axis: normal  Intervals: normal  ST/T Wave abnormalities: nonspecific ST changes  Conduction  Disutrbances:none  Narrative Interpretation: Normal QTC, no signs of Brugada syndrome, WPW or other reentrant tachycardia, heart block, hocm  Old EKG Reviewed: unchanged essentially from 10/19  I have personally reviewed the EKG tracing and agree with the computerized printout as noted.  Radiology No results found.  Procedures .Marland KitchenLaceration Repair  Date/Time: 03/19/2019 7:41 AM Performed by: Renne Crigler, PA-C Authorized by: Renne Crigler, PA-C   Consent:    Consent obtained:  Verbal   Consent given by:  Patient   Risks discussed:  Retained foreign body, pain, infection, poor cosmetic result, poor wound healing, vascular damage and need for additional repair   Alternatives discussed:  No treatment Anesthesia (see MAR for exact dosages):    Anesthesia method:  Local infiltration   Local anesthetic:  Lidocaine 2% WITH epi Laceration details:    Location:  Face   Face location:  R eyebrow   Length (cm):  2 Repair type:    Repair type:  Simple Pre-procedure details:    Preparation:  Patient was prepped and draped in usual sterile fashion Exploration:    Wound exploration: wound explored through full range of motion and entire depth of wound probed and visualized     Wound extent: no foreign bodies/material noted     Contaminated: no   Treatment:    Area cleansed with:  Shur-Clens   Amount of cleaning:  Standard Skin repair:    Repair method:  Sutures   Suture size:  5-0   Suture material:  Nylon   Suture technique:  Simple interrupted   Number of sutures:  5 Approximation:    Approximation:  Close Post-procedure details:    Dressing:  Open (no dressing)  Patient tolerance of procedure:  Tolerated well, no immediate complications   (including critical care time)  Medications Ordered in ED Medications  alum & mag hydroxide-simeth (MAALOX/MYLANTA) 200-200-20 MG/5ML suspension 30 mL (30 mLs Oral Given 03/19/19 0702)    And  lidocaine (XYLOCAINE) 2 % viscous mouth  solution 15 mL (15 mLs Oral Given 03/19/19 0701)  lidocaine-EPINEPHrine (XYLOCAINE W/EPI) 2 %-1:200000 (PF) injection 10 mL (10 mLs Infiltration Given by Other 03/19/19 2992)    ED Course  I have reviewed the triage vital signs and the nursing notes.  Pertinent labs & imaging results that were available during my care of the patient were reviewed by me and considered in my medical decision making (see chart for details).  Patient seen and examined.  Patient looks comfortable, however at end of exam he began to have stomach cramps and is doubled over in pain.  Will give GI cocktail, check orthostatics.  He will need wound repair of his forehead laceration.  At this point, I do not feel that he requires CT imaging of the head or face.  Vital signs reviewed and are as follows: BP (!) 113/94   Pulse 84   Temp 98.4 F (36.9 C) (Oral)   Resp 16   Ht 6\' 1"  (1.854 m)   Wt 81.2 kg   SpO2 97%   BMI 23.62 kg/m   Orthostatic VS for the past 24 hrs:  BP- Lying Pulse- Lying BP- Sitting Pulse- Sitting BP- Standing at 0 minutes Pulse- Standing at 0 minutes  03/19/19 0740 (!) 142/97 87 (!) 135/101 95 115/85 115    7:41 AM abdominal pain resolved after GI cocktail.  Wound cleaned and repaired as detailed above.  7:44 AM HR increased to 115 with standing, BP 142>135>115.   8:22 AM Discussed treatment plan, follow-up plan with patient.  =Patient counseled on wound care. Patient counseled on need to return or see PCP/urgent care for suture removal in 7 days.  He knows to discontinue his fluid pill for the next 2 days.  He is encouraged to maintain good hydration at home.  Patient was urged to return to the Emergency Department urgently with worsening pain, swelling, expanding erythema especially if it streaks away from the affected area, fever, or if they have any other concerns. Patient verbalized understanding.     MDM Rules/Calculators/A&P                      Patient here with facial laceration in  setting of syncopal episode.  Also ongoing epigastric pain.  I suspect that the patient has an element of dehydration.  He is on a fluid pill and had low blood pressures at his doctor yesterday.  He is holding this medication for a couple of days.  Patient is drinking well here in emergency department.  Unfortunately he had an extended ED stay due to Covid holds.  Patient was in the emergency department for over 19 hours.  Facial laceration was repaired.  He has a loose tooth which will need to be followed up by his dentist.  I reviewed his EKG which does not have any findings of prolonged QTC, preexcitation, Brugada syndrome, heart block or other concerning findings.  He does not have any significant family history.  Patient counseled to rest and hydrate well at home.  Encouraged follow-up with PCP.  He was started on PPI for abdominal pain which will need to be rechecked.  I have not identified any indications for  admission or further work-up today.  Patient looks well.  Mild orthostasis.      Final Clinical Impression(s) / ED Diagnoses Final diagnoses:  Syncope, unspecified syncope type  Epigastric pain  Facial laceration, initial encounter    Rx / DC Orders ED Discharge Orders    None       Carlisle Cater, PA-C 03/19/19 8546    Rolland Porter, MD 03/22/19 2329

## 2019-03-19 NOTE — ED Notes (Signed)
Pt presents calm and resting.  Initially only complaining of "hunger pains."  After assessing laceration, patient reports having a headache.  6/10.  Remained calm and resting.

## 2019-03-19 NOTE — ED Notes (Signed)
PT ambulatory to RR independently

## 2019-03-19 NOTE — ED Notes (Signed)
After being assessed by PA, Pt is now up and pacing in front of bed and c/o abdominal pain.  PA reports pain began after the patient bent over.

## 2019-03-23 ENCOUNTER — Other Ambulatory Visit: Payer: Self-pay | Admitting: Internal Medicine

## 2019-03-23 DIAGNOSIS — I10 Essential (primary) hypertension: Secondary | ICD-10-CM

## 2019-03-27 ENCOUNTER — Other Ambulatory Visit: Payer: Self-pay | Admitting: Family

## 2019-03-27 ENCOUNTER — Encounter: Payer: Self-pay | Admitting: Family

## 2019-03-27 ENCOUNTER — Ambulatory Visit (INDEPENDENT_AMBULATORY_CARE_PROVIDER_SITE_OTHER): Payer: 59 | Admitting: Family

## 2019-03-27 ENCOUNTER — Encounter: Payer: Self-pay | Admitting: Nurse Practitioner

## 2019-03-27 ENCOUNTER — Other Ambulatory Visit: Payer: Self-pay

## 2019-03-27 VITALS — BP 102/68 | HR 84 | Temp 98.5°F | Ht 73.0 in | Wt 184.0 lb

## 2019-03-27 DIAGNOSIS — D509 Iron deficiency anemia, unspecified: Secondary | ICD-10-CM

## 2019-03-27 DIAGNOSIS — R55 Syncope and collapse: Secondary | ICD-10-CM

## 2019-03-27 DIAGNOSIS — I10 Essential (primary) hypertension: Secondary | ICD-10-CM

## 2019-03-27 LAB — CBC WITH DIFFERENTIAL/PLATELET
Basophils Absolute: 0.1 10*3/uL (ref 0.0–0.1)
Basophils Relative: 1.2 % (ref 0.0–3.0)
Eosinophils Absolute: 0.2 10*3/uL (ref 0.0–0.7)
Eosinophils Relative: 2.1 % (ref 0.0–5.0)
HCT: 29.4 % — ABNORMAL LOW (ref 39.0–52.0)
Hemoglobin: 10 g/dL — ABNORMAL LOW (ref 13.0–17.0)
Lymphocytes Relative: 25.9 % (ref 12.0–46.0)
Lymphs Abs: 1.8 10*3/uL (ref 0.7–4.0)
MCHC: 33.9 g/dL (ref 30.0–36.0)
MCV: 100.1 fl — ABNORMAL HIGH (ref 78.0–100.0)
Monocytes Absolute: 0.7 10*3/uL (ref 0.1–1.0)
Monocytes Relative: 9.6 % (ref 3.0–12.0)
Neutro Abs: 4.3 10*3/uL (ref 1.4–7.7)
Neutrophils Relative %: 61.2 % (ref 43.0–77.0)
Platelets: 364 10*3/uL (ref 150.0–400.0)
RBC: 2.93 Mil/uL — ABNORMAL LOW (ref 4.22–5.81)
RDW: 13.8 % (ref 11.5–15.5)
WBC: 7.1 10*3/uL (ref 4.0–10.5)

## 2019-03-27 LAB — COMPREHENSIVE METABOLIC PANEL
ALT: 12 U/L (ref 0–53)
AST: 12 U/L (ref 0–37)
Albumin: 3.4 g/dL — ABNORMAL LOW (ref 3.5–5.2)
Alkaline Phosphatase: 37 U/L — ABNORMAL LOW (ref 39–117)
BUN: 13 mg/dL (ref 6–23)
CO2: 27 mEq/L (ref 19–32)
Calcium: 8.7 mg/dL (ref 8.4–10.5)
Chloride: 106 mEq/L (ref 96–112)
Creatinine, Ser: 1.15 mg/dL (ref 0.40–1.50)
GFR: 81.49 mL/min (ref >60.00)
Glucose, Bld: 100 mg/dL — ABNORMAL HIGH (ref 70–99)
Potassium: 4.3 mEq/L (ref 3.5–5.1)
Sodium: 139 mEq/L (ref 135–145)
Total Bilirubin: 0.3 mg/dL (ref 0.2–1.2)
Total Protein: 6.3 g/dL (ref 6.0–8.3)

## 2019-03-27 NOTE — Progress Notes (Signed)
Anthony Atkinson is a 49 y.o. male with the following history as recorded in EpicCare:  Patient Active Problem List   Diagnosis Date Noted  . Hyperglycemia 03/18/2019  . Epigastric discomfort 03/18/2019  . Gastroesophageal reflux disease 03/18/2019  . Marijuana use 12/26/2017  . Cyclic vomiting syndrome   . History of esophageal disorder   . Chronic diastolic CHF (congestive heart failure) ([place])   . Essential hypertension   . Panlobular emphysema ([place])   . Nausea & vomiting   . 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 Tobacco abuse 02/13/2015    Current Outpatient Medications  Medication Sig Dispense Refill  . clindamycin (CLEOCIN T) 1 % external solution Apply 1 application topically 2 (two) times daily.  3  . cloNIDine (CATAPRES) 0.3 MG tablet Take 1 tablet (0.3 mg total) by mouth 2 (two) times daily. Needs office visit for further refills 180 tablet 0  . hydrALAZINE (APRESOLINE) 25 MG tablet TAKE 1 TABLET BY MOUTH TWICE DAILY 60 tablet 1  . omeprazole (PRILOSEC) 20 MG capsule Take 1 capsule (20 mg total) by mouth daily. 30 capsule 3  . ondansetron (ZOFRAN) 4 MG tablet Take 1 tablet (4 mg total) by mouth every 8 (eight) hours as needed for nausea or vomiting. 60 tablet 2  . sildenafil (VIAGRA) 100 MG tablet TAKE ONE-HALF TO ONE TABLET BY MOUTH DAILY AS NEEDED  FOR ERECTILE DYSFUNCTION. 48 tablet 11  . tretinoin (RETIN-A) 0.05 % cream Apply 1 application topically 2 (two) times daily as needed (wound healing).     Marland Kitchen lisinopril-hydrochlorothiazide (ZESTORETIC) 20-25 MG tablet TAKE 1 TABLET BY MOUTH  DAILY (Patient not taking: Reported on 03/27/2019) 10 tablet 0   No current facility-administered medications for this visit.    Allergies: Patient has no known allergies.  Past Medical History:  Diagnosis Date  . Esophageal perforation   . Hidradenitis    Bilateral Axilla  . Hypertension     Past Surgical History:  Procedure Laterality Date  . FACIAL COSMETIC SURGERY  11/2015   cysts removed d/t infected ingrown hairs "3 wks ago" per pt (asked on 12/15/15)  . HYDRADENITIS EXCISION Bilateral 10/27/2016   Procedure: WIDE EXCISION HIDRADENITIS BILATERAL AXILLA;  Surgeon: Coralie Keens, MD;  Location: [facility] OR;  Service: General;  Laterality: Bilateral;    Family History  Problem Relation Age of Onset  . Colon cancer Neg Hx   . Esophageal cancer Neg Hx   . Pancreatic cancer Neg Hx   . Stomach cancer Neg Hx   . Liver disease Neg Hx     Social History   Tobacco Use  . Smoking status: Former Smoker    Packs/day: 0.50    Types: Cigarettes    Quit date: 12/14/2015    Years since quitting: 3.2  . Smokeless tobacco: Never Used  Substance Use Topics  . Alcohol use: Yes    Comment: occasional    Subjective:  Patient had a syncopal episode on 03/19/19 and ended up at the ER; required 5 stitches over his right eyebrow/ forehead and needs those removed today; notes that he has been feeling better recently and feels that his appetite has returned to normal; he was told to stop the Lisinopril HCT by the ER provider and has continued to stay off that medication; he has continued to take his Clonidine and Hydralazine; Denies any chest pain, shortness of breath, blurred vision or headache   Objective:  Vitals:   03/27/19 1020  BP: 102/68  Pulse: 84  Temp:  98.5 F (36.9 C)  TempSrc: Oral  SpO2: 96%  Weight: 184 lb (83.5 kg)  Height: 6' 1" (1.854 m)    General: Well developed, well nourished, in no acute distress  Skin : Warm and dry. Healed laceration noted over right eyebrow- 5 stitches removed with no difficulty;  Head: Normocephalic and atraumatic  Eyes: Sclera and conjunctiva clear; pupils round and reactive to light; extraocular movements intact  Lungs: Respirations unlabored; clear to auscultation bilaterally without wheeze, rales, rhonchi  Neurologic: Alert and oriented; speech intact; face symmetrical; moves all extremities well; CNII-XII intact without focal  deficit   Assessment:  1. Syncope, unspecified syncope type   2. HTN (hypertension), benign     Plan:  Stitches removed with no difficulty; area covered with Triple antibiotic and band-aid; patient is encouraged to keep area covered for next few days; can also start using Mederma to help minimize scarring; He is only taking 2 of his 3 blood pressure medications ( off Lisinopril HCT since 03/19/2019) and pressure appears low again today; check CBC, CMP today; follow-up with his PCP in 1 week for re-check.  Weight appears to be normalizing as well- up 5 pounds compared to OV on 03/18/19.  This visit occurred during the SARS-CoV-2 public health emergency.  Safety protocols were in place, including screening questions prior to the visit, additional usage of staff PPE, and extensive cleaning of exam room while observing appropriate contact time as indicated for disinfecting solutions.    Return in about 1 week (around 04/03/2019) for with Dr. Sharlet Salina.  Orders Placed This Encounter  Procedures  . CBC w/Diff  . Comp Met (CMET)    Requested Prescriptions    No prescriptions requested or ordered in this encounter

## 2019-03-29 ENCOUNTER — Other Ambulatory Visit: Payer: 59

## 2019-03-29 ENCOUNTER — Other Ambulatory Visit: Payer: Self-pay

## 2019-03-29 DIAGNOSIS — D509 Iron deficiency anemia, unspecified: Secondary | ICD-10-CM

## 2019-03-30 LAB — IRON,TIBC AND FERRITIN PANEL
%SAT: 14 % (calc) — ABNORMAL LOW (ref 20–48)
Ferritin: 278 ng/mL (ref 38–380)
Iron: 34 ug/dL — ABNORMAL LOW (ref 50–180)
TIBC: 242 mcg/dL (calc) — ABNORMAL LOW (ref 250–425)

## 2019-04-03 ENCOUNTER — Ambulatory Visit: Payer: 59 | Admitting: Family

## 2019-04-10 ENCOUNTER — Encounter: Payer: Self-pay | Admitting: Nurse Practitioner

## 2019-04-10 ENCOUNTER — Other Ambulatory Visit: Payer: Self-pay

## 2019-04-10 ENCOUNTER — Ambulatory Visit (INDEPENDENT_AMBULATORY_CARE_PROVIDER_SITE_OTHER): Payer: 59 | Admitting: Nurse Practitioner

## 2019-04-10 ENCOUNTER — Other Ambulatory Visit (INDEPENDENT_AMBULATORY_CARE_PROVIDER_SITE_OTHER): Payer: 59

## 2019-04-10 VITALS — BP 80/60 | HR 88 | Temp 98.5°F | Ht 73.0 in | Wt 183.0 lb

## 2019-04-10 DIAGNOSIS — K219 Gastro-esophageal reflux disease without esophagitis: Secondary | ICD-10-CM

## 2019-04-10 DIAGNOSIS — K920 Hematemesis: Secondary | ICD-10-CM

## 2019-04-10 DIAGNOSIS — D539 Nutritional anemia, unspecified: Secondary | ICD-10-CM

## 2019-04-10 DIAGNOSIS — Z1211 Encounter for screening for malignant neoplasm of colon: Secondary | ICD-10-CM

## 2019-04-10 DIAGNOSIS — Z01818 Encounter for other preprocedural examination: Secondary | ICD-10-CM | POA: Diagnosis not present

## 2019-04-10 LAB — FOLATE: Folate: 11.5 ng/mL (ref >5.9)

## 2019-04-10 LAB — VITAMIN B12: Vitamin B-12: 310 pg/mL (ref 211–911)

## 2019-04-10 MED ORDER — NA SULFATE-K SULFATE-MG SULF 17.5-3.13-1.6 GM/177ML PO SOLN: ORAL | 0 refills | Status: DC

## 2019-04-10 NOTE — Patient Instructions (Signed)
If you are age 50 or older, your body mass index should be between 23-30. Your Body mass index is 24.14 kg/m. If this is out of the aforementioned range listed, please consider follow up with your Primary Care Provider.  If you are age 50 or younger, your body mass index should be between 19-25. Your Body mass index is 24.14 kg/m. If this is out of the aformentioned range listed, please consider follow up with your Primary Care Provider.   You have been scheduled for an endoscopy and colonoscopy. Please follow the written instructions given to you at your visit today. Please pick up your prep supplies at the pharmacy within the next 1-3 days. If you use inhalers (even only as needed), please bring them with you on the day of your procedure. Your physician has requested that you go to www.startemmi.com and enter the access code given to you at your visit today. This web site gives a general overview about your procedure. However, you should still follow specific instructions given to you by our office regarding your preparation for the procedure.  We have sent the following medications to your pharmacy for you to pick up at your convenience: Suprep  Your provider has requested that you go to the basement level for lab work before leaving today. Press "B" on the elevator. The lab is located at the first door on the left as you exit the elevator.  Continue daily Prilosec.  Thank you for choosing me and [facility].   Willette Cluster, NP

## 2019-04-10 NOTE — Progress Notes (Signed)
ASSESSMENT / PLAN:   1. 49 yo male with macrocytic anemia / hematemesis last month.  Several episodes of "brown emesis"  the day after Christmas. Symptoms have since resolved. Interestingly hgb on 03/18/19 was normal at 13, now down to 10 in absence of ongoing / recurrent dark emesis or other signs of GI bleeding.   -Doesn't appear to be iron deficient. Given macrocytosis I will check B12, folate -Better now but he was having burning in upper abdomen and chest. GERD?  -For further evaluation of anemia / dark emesis and burning patient will be scheduled for EGD. The risks and benefits of EGD were discussed and the patient agrees to proceed.  -continue daily PPI  2. CRC screening.  -Will schedule him for colonoscopy to be done at time of EGD. The risks and benefits of EGD were discussed and the patient agrees to proceed.   3. Weight loss. Documented loss of ~ 20 pounds since late December. He was scared to eat due to fear of vomiting and tearing esophagus (history of Boerhaave's syndrome). He hasn't had any further N/V in weeks. He has been able to regain a few pounds.   4. Hypotension. BP has been fluctuating. At one time BP meds were on hold but he is back on them. BP 80/60 today, he feels fine without dizziness.  -Will reach out to PCP -perhaps she prefers patient to hold BP meds for now     HPI:    Referring Provider:  Olive Bass , FNP    Reason for referral:   anemia   Chief Complaint:   anemia  History comes from patient and the chart 49 yo male with hx of chronic intermittent vomiting / history of Boerhaave's syndrome treated converatively, grade I diastolic heart failure, tobacco use, marijuana use, GERD  The day after Christmas patient began having nausea / vomiting, possible related to dining out. He subsequently had ~ 5 episodes of dark brown emesis. Felt better after a couple of days but was scared to eat, didn't want to vomit and tear his  esophagus. He lose about 20 pounds before seeing PCP 03/18/19. He had been having burning from umbilicus to chest. Started on Prilosec, labs obtained. Still taking Prilosec and no further burning. Labs were remarkable for low sodium and Cl- . He had AKI, total bili up at 1.3 but predominantly indirect. WBC and hgb were normal. BP was low, advised to hold BP meds for a couple of days.   After leaving PCP's office patient went home and later that day had a syncopal episode,  sustained a laceration over right eye. Went to ED, waited hours to be seen. By that time he was having recurrent burning in chest but no more syncopal episodes. GI cocktail helped the burning.. EKG showed NSR. He was mildly orthostatic. BP meds were held. No repeat labs obtained nor imaging performed.   Patient had a follow up with PCP on 03/27/19 as advised by ED. Repeat labs remarkable for hgb drop of 3 points from 13.4 to 10 (repeated and verified). AKI resolved. Sodium and K+ back to normal. He hasn't had any further episodes of dark emesis since day after Christmas, never did have any dark stool. Doesn't take NSAIDS.    Data reviewed 03/27/19 hgb 10 mcv 100 Ferritin 278, tibc 242, 14 % sat  Past Medical History:  Diagnosis Date  . Esophageal  perforation   . Hidradenitis    Bilateral Axilla  . Hypertension      Past Surgical History:  Procedure Laterality Date  . FACIAL COSMETIC SURGERY  11/2015   cysts removed d/t infected ingrown hairs "3 wks ago" per pt (asked on 12/15/15)  . HYDRADENITIS EXCISION Bilateral 10/27/2016   Procedure: WIDE EXCISION HIDRADENITIS BILATERAL AXILLA;  Surgeon: Coralie Keens, MD;  Location: [facility] OR;  Service: General;  Laterality: Bilateral;   Family History  Problem Relation Age of Onset  . Colon cancer Neg Hx   . Esophageal cancer Neg Hx   . Pancreatic cancer Neg Hx   . Stomach cancer Neg Hx   . Liver disease Neg Hx    Social History   Tobacco Use  . Smoking status: Former  Smoker    Packs/day: 0.50    Types: Cigarettes    Quit date: 12/14/2015    Years since quitting: 3.3  . Smokeless tobacco: Never Used  Substance Use Topics  . Alcohol use: Yes    Comment: occasional  . Drug use: Yes    Types: Marijuana    Comment: 1/day per pt   Current Outpatient Medications  Medication Sig Dispense Refill  . clindamycin (CLEOCIN T) 1 % external solution Apply 1 application topically 2 (two) times daily.  3  . cloNIDine (CATAPRES) 0.3 MG tablet Take 1 tablet (0.3 mg total) by mouth 2 (two) times daily. Needs office visit for further refills 180 tablet 0  . hydrALAZINE (APRESOLINE) 25 MG tablet TAKE 1 TABLET BY MOUTH TWICE DAILY 60 tablet 1  . lisinopril-hydrochlorothiazide (ZESTORETIC) 20-25 MG tablet TAKE 1 TABLET BY MOUTH  DAILY (Patient not taking: Reported on 03/27/2019) 10 tablet 0  . omeprazole (PRILOSEC) 20 MG capsule Take 1 capsule (20 mg total) by mouth daily. 30 capsule 3  . ondansetron (ZOFRAN) 4 MG tablet Take 1 tablet (4 mg total) by mouth every 8 (eight) hours as needed for nausea or vomiting. 60 tablet 2  . sildenafil (VIAGRA) 100 MG tablet TAKE ONE-HALF TO ONE TABLET BY MOUTH DAILY AS NEEDED  FOR ERECTILE DYSFUNCTION. 48 tablet 11  . tretinoin (RETIN-A) 0.05 % cream Apply 1 application topically 2 (two) times daily as needed (wound healing).      No current facility-administered medications for this visit.   No Known Allergies   Review of Systems: All systems reviewed and negative except where noted in HPI.    Physical Exam:    Wt Readings from Last 3 Encounters:  03/27/19 184 lb (83.5 kg)  03/18/19 179 lb (81.2 kg)  03/18/19 179 lb (81.2 kg)    BP (!) 80/60   Pulse 88   Temp 98.5 F (36.9 C)   Ht 6\' 1"  (1.854 m)   Wt 183 lb (83 kg)   BMI 24.14 kg/m  Constitutional:  Pleasant well developed male in no acute distress. Psychiatric: Normal mood and affect. Behavior is normal. EENT: Pupils normal.  Conjunctivae are normal. No scleral  icterus. Neck supple.  Cardiovascular: Normal rate, regular rhythm. No edema Pulmonary/chest: Effort normal and breath sounds normal. No wheezing, rales or rhonchi. Abdominal: Soft, nondistended, nontender. Bowel sounds active throughout. There are no masses palpable. No hepatomegaly. Neurological: Alert and oriented to person place and time. Skin: Skin is warm and dry. No rashes noted.  Tye Savoy, NP  04/10/2019, 2:56 PM  Cc:  Marrian Salvage,*

## 2019-04-22 ENCOUNTER — Other Ambulatory Visit: Payer: Self-pay | Admitting: Internal Medicine

## 2019-04-22 DIAGNOSIS — I10 Essential (primary) hypertension: Secondary | ICD-10-CM

## 2019-04-22 NOTE — Progress Notes (Signed)
Reviewed and agree with documentation and assessment and plan. K. Veena Kaye Luoma , MD   

## 2019-04-24 ENCOUNTER — Telehealth: Payer: Self-pay | Admitting: Internal Medicine

## 2019-04-24 NOTE — Telephone Encounter (Signed)
   *  STAT* If patient is at the pharmacy, call can be transferred to refill team.   1. Which medications need to be refilled? (please list name of each medication and dose if known) ALL MEDS  2. Which pharmacy/location (including street and city if local pharmacy) is medication to be sent to [facility] [phone] - [place], [place] - [address] OF [address]. Do they need a 30 day or 90 day supply? 90

## 2019-04-24 NOTE — Telephone Encounter (Signed)
Denied. Pt has not been seen since 2019. Please call to schedule an OV.

## 2019-04-29 ENCOUNTER — Ambulatory Visit (INDEPENDENT_AMBULATORY_CARE_PROVIDER_SITE_OTHER): Payer: 59

## 2019-04-29 ENCOUNTER — Other Ambulatory Visit: Payer: Self-pay | Admitting: Gastroenterology

## 2019-04-29 DIAGNOSIS — Z1159 Encounter for screening for other viral diseases: Secondary | ICD-10-CM

## 2019-04-29 LAB — SARS CORONAVIRUS 2 (TAT 6-24 HRS): SARS Coronavirus 2: NEGATIVE

## 2019-04-30 ENCOUNTER — Telehealth: Payer: Self-pay

## 2019-04-30 ENCOUNTER — Emergency Department (HOSPITAL_COMMUNITY)
Admission: EM | Admit: 2019-04-30 | Discharge: 2019-04-30 | Disposition: A | Discharge status: Home / Self Care | Payer: 59 | Attending: Emergency Medicine | Admitting: Emergency Medicine

## 2019-04-30 ENCOUNTER — Encounter (HOSPITAL_COMMUNITY): Payer: Self-pay | Admitting: Emergency Medicine

## 2019-04-30 ENCOUNTER — Other Ambulatory Visit: Payer: Self-pay

## 2019-04-30 ENCOUNTER — Emergency Department (HOSPITAL_COMMUNITY): Payer: 59

## 2019-04-30 DIAGNOSIS — Z79899 Other long term (current) drug therapy: Secondary | ICD-10-CM | POA: Diagnosis not present

## 2019-04-30 DIAGNOSIS — I10 Essential (primary) hypertension: Secondary | ICD-10-CM | POA: Diagnosis not present

## 2019-04-30 DIAGNOSIS — Z87891 Personal history of nicotine dependence: Secondary | ICD-10-CM | POA: Diagnosis not present

## 2019-04-30 DIAGNOSIS — R112 Nausea with vomiting, unspecified: Secondary | ICD-10-CM | POA: Insufficient documentation

## 2019-04-30 LAB — CBC WITH DIFFERENTIAL/PLATELET
Abs Immature Granulocytes: 0.07 10*3/uL (ref 0.00–0.07)
Basophils Absolute: 0 10*3/uL (ref 0.0–0.1)
Basophils Relative: 0 %
Eosinophils Absolute: 0 10*3/uL (ref 0.0–0.5)
Eosinophils Relative: 0 %
HCT: 46.3 % (ref 39.0–52.0)
Hemoglobin: 15.4 g/dL (ref 13.0–17.0)
Immature Granulocytes: 0 %
Lymphocytes Relative: 5 %
Lymphs Abs: 0.9 10*3/uL (ref 0.7–4.0)
MCH: 32.5 pg (ref 26.0–34.0)
MCHC: 33.3 g/dL (ref 30.0–36.0)
MCV: 97.7 fL (ref 80.0–100.0)
Monocytes Absolute: 0.8 10*3/uL (ref 0.1–1.0)
Monocytes Relative: 5 %
Neutro Abs: 15.2 10*3/uL — ABNORMAL HIGH (ref 1.7–7.7)
Neutrophils Relative %: 90 %
Platelets: 444 10*3/uL — ABNORMAL HIGH (ref 150–400)
RBC: 4.74 MIL/uL (ref 4.22–5.81)
RDW: 14.1 % (ref 11.5–15.5)
WBC: 17 10*3/uL — ABNORMAL HIGH (ref 4.0–10.5)
nRBC: 0 % (ref 0.0–0.2)

## 2019-04-30 LAB — COMPREHENSIVE METABOLIC PANEL
ALT: 31 U/L (ref 0–44)
AST: 41 U/L (ref 15–41)
Albumin: 5 g/dL (ref 3.5–5.0)
Alkaline Phosphatase: 54 U/L (ref 38–126)
Anion gap: 20 — ABNORMAL HIGH (ref 5–15)
BUN: 23 mg/dL — ABNORMAL HIGH (ref 6–20)
CO2: 19 mmol/L — ABNORMAL LOW (ref 22–32)
Calcium: 10.5 mg/dL — ABNORMAL HIGH (ref 8.9–10.3)
Chloride: 101 mmol/L (ref 98–111)
Creatinine, Ser: 1.57 mg/dL — ABNORMAL HIGH (ref 0.61–1.24)
GFR calc Af Amer: 59 mL/min — ABNORMAL LOW (ref >60)
GFR calc non Af Amer: 51 mL/min — ABNORMAL LOW (ref >60)
Glucose, Bld: 171 mg/dL — ABNORMAL HIGH (ref 70–99)
Potassium: 3.8 mmol/L (ref 3.5–5.1)
Sodium: 140 mmol/L (ref 135–145)
Total Bilirubin: 1.5 mg/dL — ABNORMAL HIGH (ref 0.3–1.2)
Total Protein: 9.5 g/dL — ABNORMAL HIGH (ref 6.5–8.1)

## 2019-04-30 LAB — URINALYSIS, ROUTINE W REFLEX MICROSCOPIC
Bacteria, UA: NONE SEEN
Bilirubin Urine: NEGATIVE
Glucose, UA: 150 mg/dL — AB
Hgb urine dipstick: NEGATIVE
Ketones, ur: 5 mg/dL — AB
Leukocytes,Ua: NEGATIVE
Nitrite: NEGATIVE
Protein, ur: >=300 mg/dL — AB
Specific Gravity, Urine: 1.018 (ref 1.005–1.030)
pH: 5 (ref 5.0–8.0)

## 2019-04-30 LAB — RAPID URINE DRUG SCREEN, HOSP PERFORMED
Amphetamines: NOT DETECTED
Barbiturates: NOT DETECTED
Benzodiazepines: NOT DETECTED
Cocaine: NOT DETECTED
Opiates: NOT DETECTED
Tetrahydrocannabinol: POSITIVE — AB

## 2019-04-30 LAB — MAGNESIUM: Magnesium: 1.8 mg/dL (ref 1.7–2.4)

## 2019-04-30 MED ORDER — ONDANSETRON 4 MG PO TBDP: 4.0000 mg | ORAL_TABLET | Freq: Three times a day (TID) | ORAL | 0 refills | Status: DC | PRN

## 2019-04-30 MED ORDER — LORAZEPAM 2 MG/ML IJ SOLN
1.0000 mg | Freq: Once | INTRAMUSCULAR | Status: AC
  Administered 2019-04-30: 1 mg via INTRAVENOUS
  Filled 2019-04-30: qty 1

## 2019-04-30 MED ORDER — SODIUM CHLORIDE 0.9 % IV BOLUS
1000.0000 mL | Freq: Once | INTRAVENOUS | Status: AC
  Administered 2019-04-30: 1000 mL via INTRAVENOUS

## 2019-04-30 NOTE — Discharge Instructions (Signed)
We saw in the ER for nausea and vomiting. Fortunately it seems like her symptoms have improved.  Take nausea medication as prescribed. We recommend clear liquid diet for now.

## 2019-04-30 NOTE — ED Provider Notes (Signed)
[facility] DEPT Provider Note   CSN: [id number] Arrival date & time: 04/30/19  1831     History Chief Complaint  Patient presents with  . Abdominal Pain  . Emesis    Anthony Atkinson is a 49 y.o. male.  HPI     49 year old with history of GERD, cyclic vomiting syndrome, chronic CHF, esophageal perforation comes in a chief complaint of nausea and vomiting.  Patient reports that he supposed to get upper endoscopy tomorrow, however he woke up this morning and has been constantly nauseated and vomiting.  He had emesis x5 thus far.  He denies any blood in his vomitus, and it is not bilious.  He has some epigastric abdominal pain.  Patient feels like his symptoms are because of anxiety.  He denies any marijuana use.   Past Medical History:  Diagnosis Date  . Esophageal perforation   . Hidradenitis    Bilateral Axilla  . Hypertension     Patient Active Problem List   Diagnosis Date Noted  . Hyperglycemia 03/18/2019  . Epigastric discomfort 03/18/2019  . Gastroesophageal reflux disease 03/18/2019  . Marijuana use 12/26/2017  . Cyclic vomiting syndrome   . History of esophageal disorder   . Chronic diastolic CHF (congestive heart failure) ([place])   . Essential hypertension   . Panlobular emphysema ([place])   . Nausea & vomiting   . 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 Tobacco abuse 02/13/2015    Past Surgical History:  Procedure Laterality Date  . FACIAL COSMETIC SURGERY  11/2015   cysts removed d/t infected ingrown hairs "3 wks ago" per pt (asked on 12/15/15)  . HYDRADENITIS EXCISION Bilateral 10/27/2016   Procedure: WIDE EXCISION HIDRADENITIS BILATERAL AXILLA;  Surgeon: Coralie Keens, MD;  Location: [facility] OR;  Service: General;  Laterality: Bilateral;       Family History  Problem Relation Age of Onset  . Colon cancer Neg Hx   . Esophageal cancer Neg Hx   . Pancreatic cancer Neg Hx   . Stomach cancer Neg Hx   . Liver disease Neg Hx      Social History   Tobacco Use  . Smoking status: Former Smoker    Packs/day: 0.50    Types: Cigarettes    Quit date: 12/14/2015    Years since quitting: 3.3  . Smokeless tobacco: Never Used  Substance Use Topics  . Alcohol use: Yes    Comment: occasional  . Drug use: Yes    Types: Marijuana    Comment: 1/day per pt    Home Medications Prior to Admission medications   Medication Sig Start Date End Date Taking? Authorizing Provider  hydrALAZINE (APRESOLINE) 25 MG tablet TAKE 1 TABLET BY MOUTH TWICE DAILY 03/25/19  Yes Hoyt Koch, MD  lisinopril-hydrochlorothiazide (ZESTORETIC) 20-25 MG tablet TAKE 1 TABLET BY MOUTH  DAILY 02/15/19  Yes Hoyt Koch, MD  omeprazole (PRILOSEC) 20 MG capsule Take 1 capsule (20 mg total) by mouth daily. 03/18/19  Yes Burns, Claudina Lick, MD  ondansetron (ZOFRAN) 4 MG tablet Take 1 tablet (4 mg total) by mouth every 8 (eight) hours as needed for nausea or vomiting. 01/02/18  Yes Hoyt Koch, MD  sildenafil (VIAGRA) 100 MG tablet TAKE ONE-HALF TO ONE TABLET BY MOUTH DAILY AS NEEDED  FOR ERECTILE DYSFUNCTION. Patient taking differently: Take 25-50 mg by mouth daily as needed for erectile dysfunction.  12/24/18  Yes Hoyt Koch, MD  tretinoin (RETIN-A) 0.05 % cream Apply 1 application topically  2 (two) times daily as needed (wound healing).  09/30/16  Yes [provider]  cloNIDine (CATAPRES) 0.3 MG tablet Take 1 tablet (0.3 mg total) by mouth 2 (two) times daily. Needs office visit for further refills Patient not taking: Reported on 04/30/2019 11/05/18   Myrlene Broker, MD  Na Sulfate-K Sulfate-Mg Sulf 17.5-3.13-1.6 GM/177ML SOLN Suprep-Use as directed Patient not taking: Reported on 04/30/2019 04/10/19   Meredith Pel, NP  ondansetron (ZOFRAN ODT) 4 MG disintegrating tablet Take 1 tablet (4 mg total) by mouth every 8 (eight) hours as needed for nausea or vomiting. 04/30/19   Derwood Kaplan, MD    Allergies     Patient has no known allergies.  Review of Systems   Review of Systems  Constitutional: Positive for activity change.  Respiratory: Negative for shortness of breath.   Cardiovascular: Negative for chest pain.  Gastrointestinal: Positive for abdominal pain, nausea and vomiting.  Allergic/Immunologic: Negative for immunocompromised state.  Hematological: Does not bruise/bleed easily.    Physical Exam Updated Vital Signs BP (!) 194/125   Pulse (!) 111   Temp 98.7 F (37.1 C)   Resp 20   Ht 6\' 1"  (1.854 m)   SpO2 99%   BMI 24.14 kg/m   Physical Exam Vitals and nursing note reviewed.  Constitutional:      Appearance: He is well-developed.  HENT:     Head: Atraumatic.     Comments: Mucous membranes are dry Cardiovascular:     Rate and Rhythm: Normal rate.  Pulmonary:     Effort: Pulmonary effort is normal.  Abdominal:     Tenderness: There is abdominal tenderness in the epigastric area and periumbilical area. There is no guarding or rebound.  Musculoskeletal:     Cervical back: Neck supple.  Skin:    General: Skin is warm.  Neurological:     Mental Status: He is alert and oriented to person, place, and time.     ED Results / Procedures / Treatments   Labs (all labs ordered are listed, but only abnormal results are displayed) Labs Reviewed  COMPREHENSIVE METABOLIC PANEL - Abnormal; Notable for the following components:      Result Value   CO2 19 (*)    Glucose, Bld 171 (*)    BUN 23 (*)    Creatinine, Ser 1.57 (*)    Calcium 10.5 (*)    Total Protein 9.5 (*)    Total Bilirubin 1.5 (*)    GFR calc non Af Amer 51 (*)    GFR calc Af Amer 59 (*)    Anion gap 20 (*)    All other components within normal limits  CBC WITH DIFFERENTIAL/PLATELET - Abnormal; Notable for the following components:   WBC 17.0 (*)    Platelets 444 (*)    Neutro Abs 15.2 (*)    All other components within normal limits  URINALYSIS, ROUTINE W REFLEX MICROSCOPIC - Abnormal; Notable for  the following components:   APPearance HAZY (*)    Glucose, UA 150 (*)    Ketones, ur 5 (*)    Protein, ur >=300 (*)    All other components within normal limits  RAPID URINE DRUG SCREEN, HOSP PERFORMED - Abnormal; Notable for the following components:   Tetrahydrocannabinol POSITIVE (*)    All other components within normal limits  MAGNESIUM    EKG EKG Interpretation  Date/Time:  Tuesday April 30 2019 18:54:13 EST Ventricular Rate:  98 PR Interval:    QRS Duration:  98 QT Interval:  351 QTC Calculation: 449 R Axis:   86 Text Interpretation: Sinus rhythm Ventricular premature complex Probable left atrial enlargement No acute changes No significant change since last tracing Confirmed by Derwood Kaplan ([id number]) on 04/30/2019 8:05:30 PM   Radiology DG Chest Port 1 View  Result Date: 04/30/2019 CLINICAL DATA:  Vomiting. EXAM: PORTABLE CHEST 1 VIEW COMPARISON:  12/15/2015 FINDINGS: The heart size and mediastinal contours are within normal limits. Both lungs are clear. The visualized skeletal structures are unremarkable. IMPRESSION: No active disease. Electronically Signed   By: Katherine Mantle M.D.   On: 04/30/2019 20:24    Procedures Procedures (including critical care time)  Medications Ordered in ED Medications  sodium chloride 0.9 % bolus 1,000 mL (0 mLs Intravenous Stopped 04/30/19 2240)  LORazepam (ATIVAN) injection 1 mg (1 mg Intravenous Given 04/30/19 2008)    ED Course  I have reviewed the triage vital signs and the nursing notes.  Pertinent labs & imaging results that were available during my care of the patient were reviewed by me and considered in my medical decision making (see chart for details).    MDM Rules/Calculators/A&P                      49 year old comes in a chief complaint of nausea and vomiting.  He reports that he has been feeling anxious all day and is noted to be tachypneic.  Patient does appear dry.  We will give IV fluids and check basic  labs which are reassuring.  Patient was given Ativan so that his nausea and anxiety can get better.  On reassessment patient is feeling 100% better.  The other thought was that the nausea and vomiting could be cyclic vomiting syndrome -however given that he is feeling well after Ativan we will not pursue giving him any Haldol.  Patient recommended to follow clear liquid diet.  The patient appears reasonably screened and/or stabilized for discharge and I doubt any other medical condition or other [facility] requiring further screening, evaluation, or treatment in the ED at this time prior to discharge.   Results from the ER workup discussed with the patient face to face and all questions answered to the best of my ability. The patient is safe for discharge with strict return precautions.    Final Clinical Impression(s) / ED Diagnoses Final diagnoses:  Non-intractable vomiting with nausea, unspecified vomiting type    Rx / DC Orders ED Discharge Orders         Ordered    ondansetron (ZOFRAN ODT) 4 MG disintegrating tablet  Every 8 hours PRN     04/30/19 2259           Derwood Kaplan, MD 04/30/19 2306

## 2019-04-30 NOTE — ED Notes (Signed)
Pt attempting to provide urine specimen

## 2019-04-30 NOTE — Telephone Encounter (Signed)
Please advise patient to come to ER for IV hydration given he is having persistent vomiting despite Zofran and unable to tolerate any PO intake.  We will need to reschedule the EGD and colonoscopy for a later date. Thanks

## 2019-04-30 NOTE — Telephone Encounter (Signed)
Call to pt and informed of Dr Lavon Paganini advice to go to ER for fluids and that he will need to call back to reschedule procedure when he is able.

## 2019-04-30 NOTE — ED Notes (Signed)
Holly, RN made aware of pt's blood pressure and HR previously documented. Pt placed back on cardiac monitor and asked to remain in the bed. Pt states he needs water, to go home and to sit on the stool because "his back hurts".

## 2019-04-30 NOTE — ED Triage Notes (Signed)
Pt reports abd pains and vomiting that started this morning and gone on all day. Was advised to come to ED for IV hydration by PCP.

## 2019-04-30 NOTE — Telephone Encounter (Signed)
Pt called c/o vomiting "all day", pt's wife takes the phone as pt is retching, pt has endocolon sched for tomorrow at 130pm. States no fever, but has chills and sweats, states took zofran x2 and vomitted both times right afterward.   Let them know I will call Dr Lavon Paganini to speak with them.

## 2019-04-30 NOTE — ED Notes (Signed)
Pt noted to be drinking water from sink in the room after staff asking pt to refrain from drinking anything at this time.

## 2019-05-01 ENCOUNTER — Emergency Department (HOSPITAL_COMMUNITY)
Admission: EM | Admit: 2019-05-01 | Discharge: 2019-05-02 | Disposition: A | Discharge status: Home / Self Care | Payer: 59 | Attending: Emergency Medicine | Admitting: Emergency Medicine

## 2019-05-01 ENCOUNTER — Encounter (HOSPITAL_COMMUNITY): Payer: Self-pay

## 2019-05-01 ENCOUNTER — Other Ambulatory Visit: Payer: Self-pay

## 2019-05-01 ENCOUNTER — Encounter: Payer: 59 | Admitting: Gastroenterology

## 2019-05-01 DIAGNOSIS — Z79899 Other long term (current) drug therapy: Secondary | ICD-10-CM | POA: Diagnosis not present

## 2019-05-01 DIAGNOSIS — I5032 Chronic diastolic (congestive) heart failure: Secondary | ICD-10-CM | POA: Diagnosis not present

## 2019-05-01 DIAGNOSIS — I110 Hypertensive heart disease with heart failure: Secondary | ICD-10-CM | POA: Insufficient documentation

## 2019-05-01 DIAGNOSIS — R1114 Bilious vomiting: Secondary | ICD-10-CM | POA: Insufficient documentation

## 2019-05-01 DIAGNOSIS — Z87891 Personal history of nicotine dependence: Secondary | ICD-10-CM | POA: Insufficient documentation

## 2019-05-01 DIAGNOSIS — R112 Nausea with vomiting, unspecified: Secondary | ICD-10-CM | POA: Diagnosis present

## 2019-05-01 LAB — CBC WITH DIFFERENTIAL/PLATELET
Abs Immature Granulocytes: 0.07 10*3/uL (ref 0.00–0.07)
Basophils Absolute: 0 10*3/uL (ref 0.0–0.1)
Basophils Relative: 0 %
Eosinophils Absolute: 0 10*3/uL (ref 0.0–0.5)
Eosinophils Relative: 0 %
HCT: 39.1 % (ref 39.0–52.0)
Hemoglobin: 13.3 g/dL (ref 13.0–17.0)
Immature Granulocytes: 1 %
Lymphocytes Relative: 11 %
Lymphs Abs: 1.6 10*3/uL (ref 0.7–4.0)
MCH: 33.3 pg (ref 26.0–34.0)
MCHC: 34 g/dL (ref 30.0–36.0)
MCV: 98 fL (ref 80.0–100.0)
Monocytes Absolute: 0.7 10*3/uL (ref 0.1–1.0)
Monocytes Relative: 5 %
Neutro Abs: 12.9 10*3/uL — ABNORMAL HIGH (ref 1.7–7.7)
Neutrophils Relative %: 83 %
Platelets: 297 10*3/uL (ref 150–400)
RBC: 3.99 MIL/uL — ABNORMAL LOW (ref 4.22–5.81)
RDW: 14.5 % (ref 11.5–15.5)
WBC: 15.3 10*3/uL — ABNORMAL HIGH (ref 4.0–10.5)
nRBC: 0 % (ref 0.0–0.2)

## 2019-05-01 LAB — LIPASE, BLOOD: Lipase: 39 U/L (ref 11–51)

## 2019-05-01 LAB — BLOOD GAS, VENOUS
Acid-Base Excess: 4.6 mmol/L — ABNORMAL HIGH (ref 0.0–2.0)
Bicarbonate: 26.4 mmol/L (ref 20.0–28.0)
O2 Saturation: 99.4 %
Patient temperature: 98.6
pCO2, Ven: 31.1 mmHg — ABNORMAL LOW (ref 44.0–60.0)
pH, Ven: 7.539 — ABNORMAL HIGH (ref 7.250–7.430)
pO2, Ven: 163 mmHg — ABNORMAL HIGH (ref 32.0–45.0)

## 2019-05-01 LAB — COMPREHENSIVE METABOLIC PANEL
ALT: 18 U/L (ref 0–44)
AST: 26 U/L (ref 15–41)
Albumin: 4.5 g/dL (ref 3.5–5.0)
Alkaline Phosphatase: 45 U/L (ref 38–126)
Anion gap: 14 (ref 5–15)
BUN: 30 mg/dL — ABNORMAL HIGH (ref 6–20)
CO2: 23 mmol/L (ref 22–32)
Calcium: 9.7 mg/dL (ref 8.9–10.3)
Chloride: 102 mmol/L (ref 98–111)
Creatinine, Ser: 1.24 mg/dL (ref 0.61–1.24)
GFR calc Af Amer: >60 mL/min (ref >60)
GFR calc non Af Amer: >60 mL/min (ref >60)
Glucose, Bld: 123 mg/dL — ABNORMAL HIGH (ref 70–99)
Potassium: 3.1 mmol/L — ABNORMAL LOW (ref 3.5–5.1)
Sodium: 139 mmol/L (ref 135–145)
Total Bilirubin: 1.6 mg/dL — ABNORMAL HIGH (ref 0.3–1.2)
Total Protein: 8.1 g/dL (ref 6.5–8.1)

## 2019-05-01 MED ORDER — LORAZEPAM 2 MG/ML IJ SOLN
1.0000 mg | Freq: Once | INTRAMUSCULAR | Status: AC
  Administered 2019-05-01: 1 mg via INTRAVENOUS
  Filled 2019-05-01: qty 1

## 2019-05-01 MED ORDER — FAMOTIDINE IN NACL 20-0.9 MG/50ML-% IV SOLN
20.0000 mg | Freq: Once | INTRAVENOUS | Status: AC
  Administered 2019-05-01: 20 mg via INTRAVENOUS
  Filled 2019-05-01: qty 50

## 2019-05-01 MED ORDER — SODIUM CHLORIDE 0.9 % IV BOLUS
500.0000 mL | Freq: Once | INTRAVENOUS | Status: AC
  Administered 2019-05-01: 500 mL via INTRAVENOUS

## 2019-05-01 NOTE — ED Triage Notes (Signed)
Patient returning to [facility]  stating that around 5pm he began having abdominal pain and vomiting. States around 7pm he took his nausea medication as prescribed yesterday with little relief.

## 2019-05-01 NOTE — ED Notes (Signed)
Pt walking in halls asking for blankets and someone to adjust his bed for him.  He has been advised twice that he needs to use call light in room.

## 2019-05-01 NOTE — ED Provider Notes (Signed)
[facility]-EMERGENCY DEPT Provider Note   CSN: [id number] Arrival date & time: 05/01/19  2118     History Chief Complaint  Patient presents with  . Emesis    Anthony Atkinson is a 49 y.o. male.  HPI    Patient presents for the second time in 2 days with concern for nausea, vomiting, epigastric pain.  He notes that he was better after discharge, but today about 4 hours prior to ED arrival developed nausea, vomiting.  He notes that he did use marijuana earlier in the day. He denies other new complaints including diarrhea, lower abdominal pain.  The pain is focally in the epigastrium, sore, worse with vomiting.   Past Medical History:  Diagnosis Date  . Esophageal perforation   . Hidradenitis    Bilateral Axilla  . Hypertension     Patient Active Problem List   Diagnosis Date Noted  . Hyperglycemia 03/18/2019  . Epigastric discomfort 03/18/2019  . Gastroesophageal reflux disease 03/18/2019  . Marijuana use 12/26/2017  . Cyclic vomiting syndrome   . History of esophageal disorder   . Chronic diastolic CHF (congestive heart failure) (HCC)   . Essential hypertension   . Panlobular emphysema (HCC)   . Nausea & vomiting   . 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 Tobacco abuse 02/13/2015    Past Surgical History:  Procedure Laterality Date  . FACIAL COSMETIC SURGERY  11/2015   cysts removed d/t infected ingrown hairs "3 wks ago" per pt (asked on 12/15/15)  . HYDRADENITIS EXCISION Bilateral 10/27/2016   Procedure: WIDE EXCISION HIDRADENITIS BILATERAL AXILLA;  Surgeon: Abigail Miyamoto, MD;  Location: [facility] OR;  Service: General;  Laterality: Bilateral;       Family History  Problem Relation Age of Onset  . Colon cancer Neg Hx   . Esophageal cancer Neg Hx   . Pancreatic cancer Neg Hx   . Stomach cancer Neg Hx   . Liver disease Neg Hx     Social History   Tobacco Use  . Smoking status: Former Smoker    Packs/day: 0.50    Types: Cigarettes      Quit date: 12/14/2015    Years since quitting: 3.3  . Smokeless tobacco: Never Used  Substance Use Topics  . Alcohol use: Yes    Comment: occasional  . Drug use: Yes    Types: Marijuana    Comment: 1/day per pt    Home Medications Prior to Admission medications   Medication Sig Start Date End Date Taking? Authorizing Provider  cloNIDine (CATAPRES) 0.3 MG tablet Take 1 tablet (0.3 mg total) by mouth 2 (two) times daily. Needs office visit for further refills Patient not taking: Reported on 04/30/2019 11/05/18   Myrlene Broker, MD  hydrALAZINE (APRESOLINE) 25 MG tablet TAKE 1 TABLET BY MOUTH TWICE DAILY 03/25/19   Myrlene Broker, MD  lisinopril-hydrochlorothiazide (ZESTORETIC) 20-25 MG tablet TAKE 1 TABLET BY MOUTH  DAILY 02/15/19   Myrlene Broker, MD  Na Sulfate-K Sulfate-Mg Sulf 17.5-3.13-1.6 GM/177ML SOLN Suprep-Use as directed Patient not taking: Reported on 04/30/2019 04/10/19   Meredith Pel, NP  omeprazole (PRILOSEC) 20 MG capsule Take 1 capsule (20 mg total) by mouth daily. 03/18/19   Pincus Sanes, MD  ondansetron (ZOFRAN ODT) 4 MG disintegrating tablet Take 1 tablet (4 mg total) by mouth every 8 (eight) hours as needed for nausea or vomiting. 04/30/19   Derwood Kaplan, MD  ondansetron (ZOFRAN) 4 MG tablet Take 1 tablet (4 mg  total) by mouth every 8 (eight) hours as needed for nausea or vomiting. 01/02/18   Hoyt Koch, MD  sildenafil (VIAGRA) 100 MG tablet TAKE ONE-HALF TO ONE TABLET BY MOUTH DAILY AS NEEDED  FOR ERECTILE DYSFUNCTION. Patient taking differently: Take 25-50 mg by mouth daily as needed for erectile dysfunction.  12/24/18   Hoyt Koch, MD  tretinoin (RETIN-A) 0.05 % cream Apply 1 application topically 2 (two) times daily as needed (wound healing).  09/30/16   [provider]    Allergies    Patient has no known allergies.  Review of Systems   Review of Systems  Constitutional:       Per HPI, otherwise negative   HENT:       Per HPI, otherwise negative  Respiratory:       Per HPI, otherwise negative  Cardiovascular:       Per HPI, otherwise negative  Gastrointestinal: Positive for abdominal pain, nausea and vomiting.  Endocrine:       Negative aside from HPI  Genitourinary:       Neg aside from HPI   Musculoskeletal:       Per HPI, otherwise negative  Skin: Negative.   Neurological: Negative for syncope.    Physical Exam Updated Vital Signs BP (!) 179/110 (BP Location: Right Arm)   Temp 98.3 F (36.8 C) (Oral)   Resp 18   SpO2 100%   Physical Exam Vitals and nursing note reviewed.  Constitutional:      General: He is not in acute distress.    Appearance: He is well-developed.  HENT:     Head: Normocephalic and atraumatic.  Eyes:     Conjunctiva/sclera: Conjunctivae normal.  Cardiovascular:     Rate and Rhythm: Normal rate and regular rhythm.  Pulmonary:     Effort: Pulmonary effort is normal. No respiratory distress.     Breath sounds: No stridor.  Abdominal:     Tenderness: There is abdominal tenderness in the epigastric area.  Skin:    General: Skin is warm and dry.  Neurological:     Mental Status: He is alert and oriented to person, place, and time.     ED Results / Procedures / Treatments   Labs (all labs ordered are listed, but only abnormal results are displayed) Labs Reviewed  COMPREHENSIVE METABOLIC PANEL - Abnormal; Notable for the following components:      Result Value   Potassium 3.1 (*)    Glucose, Bld 123 (*)    BUN 30 (*)    Total Bilirubin 1.6 (*)    All other components within normal limits  CBC WITH DIFFERENTIAL/PLATELET - Abnormal; Notable for the following components:   WBC 15.3 (*)    RBC 3.99 (*)    Neutro Abs 12.9 (*)    All other components within normal limits  URINALYSIS, ROUTINE W REFLEX MICROSCOPIC - Abnormal; Notable for the following components:   Ketones, ur 5 (*)    Protein, ur 100 (*)    Bacteria, UA RARE (*)    All other  components within normal limits  BLOOD GAS, VENOUS - Abnormal; Notable for the following components:   pH, Ven 7.539 (*)    pCO2, Ven 31.1 (*)    pO2, Ven 163.0 (*)    Acid-Base Excess 4.6 (*)    All other components within normal limits  BRAIN NATRIURETIC PEPTIDE  LIPASE, BLOOD    Radiology DG Chest Port 1 View  Result Date: 04/30/2019 CLINICAL DATA:  Vomiting. EXAM: PORTABLE CHEST 1 VIEW COMPARISON:  12/15/2015 FINDINGS: The heart size and mediastinal contours are within normal limits. Both lungs are clear. The visualized skeletal structures are unremarkable. IMPRESSION: No active disease. Electronically Signed   By: Katherine Mantle M.D.   On: 04/30/2019 20:24    Procedures Procedures (including critical care time)  Medications Ordered in ED Medications  sodium chloride 0.9 % bolus 500 mL (500 mLs Intravenous New Bag/Given 05/01/19 2339)  LORazepam (ATIVAN) injection 1 mg (1 mg Intravenous Given 05/01/19 2342)  famotidine (PEPCID) IVPB 20 mg premix (0 mg Intravenous Stopped 05/02/19 0022)    ED Course  I have reviewed the triage vital signs and the nursing notes.  Pertinent labs & imaging results that were available during my care of the patient were reviewed by me and considered in my medical decision making (see chart for details).  After initial evaluation I reviewed the patient's chart including documentation from yesterday's visit, suspicion for hyperemesis related to marijuana use. CT scan yesterday unremarkable.  12:51 AM Patient feeling better.  I reviewed his labs, which are better than those from yesterday.  He has no acidosis, only mild hypokalemia, decreasing leukocytosis, and with a nonperitoneal abdomen, recent reassuring CT scan, no indication for acute new pathology, nor additional advanced imaging.  We discussed possibility of marijuana related hyperemesis syndrome, and the patient amenable to a self-imposed trial of abstinence for the coming days to see if he  improves. Final Clinical Impression(s) / ED Diagnoses Final diagnoses:  Bilious vomiting with nausea     Gerhard Munch, MD 05/02/19 [phone]

## 2019-05-02 LAB — URINALYSIS, ROUTINE W REFLEX MICROSCOPIC
Bilirubin Urine: NEGATIVE
Glucose, UA: NEGATIVE mg/dL
Hgb urine dipstick: NEGATIVE
Ketones, ur: 5 mg/dL — AB
Leukocytes,Ua: NEGATIVE
Nitrite: NEGATIVE
Protein, ur: 100 mg/dL — AB
Specific Gravity, Urine: 1.03 (ref 1.005–1.030)
pH: 5 (ref 5.0–8.0)

## 2019-05-02 LAB — BRAIN NATRIURETIC PEPTIDE: B Natriuretic Peptide: 70.6 pg/mL (ref 0.0–100.0)

## 2019-05-02 NOTE — Discharge Instructions (Signed)
As discussed, your evaluation today has been largely reassuring.  But, it is important that you monitor your condition carefully, and do not hesitate to return to the ED if you develop new, or concerning changes in your condition.  Addition to the prescribed medication, please abstain from using marijuana for the next week to see if this improves your condition.  Otherwise, please follow-up with your physician for appropriate ongoing care.

## 2019-05-07 ENCOUNTER — Ambulatory Visit: Payer: 59 | Admitting: Internal Medicine

## 2019-05-08 ENCOUNTER — Other Ambulatory Visit: Payer: Self-pay

## 2019-05-08 ENCOUNTER — Ambulatory Visit (INDEPENDENT_AMBULATORY_CARE_PROVIDER_SITE_OTHER): Payer: 59 | Admitting: Internal Medicine

## 2019-05-08 ENCOUNTER — Encounter: Payer: Self-pay | Admitting: Internal Medicine

## 2019-05-08 ENCOUNTER — Telehealth: Payer: Self-pay

## 2019-05-08 VITALS — BP 142/94 | HR 85 | Temp 98.1°F | Ht 73.0 in | Wt 191.0 lb

## 2019-05-08 DIAGNOSIS — I10 Essential (primary) hypertension: Secondary | ICD-10-CM | POA: Diagnosis not present

## 2019-05-08 MED ORDER — ONDANSETRON HCL 4 MG PO TABS: 4.0000 mg | ORAL_TABLET | Freq: Three times a day (TID) | ORAL | 2 refills | Status: DC | PRN

## 2019-05-08 MED ORDER — CLONIDINE HCL 0.3 MG PO TABS: 0.3000 mg | ORAL_TABLET | Freq: Two times a day (BID) | ORAL | 3 refills | Status: DC

## 2019-05-08 MED ORDER — HYDRALAZINE HCL 25 MG PO TABS: 25.0000 mg | ORAL_TABLET | Freq: Two times a day (BID) | ORAL | 3 refills | Status: DC

## 2019-05-08 MED ORDER — OMEPRAZOLE 20 MG PO CPDR: 20.0000 mg | DELAYED_RELEASE_CAPSULE | Freq: Every day | ORAL | 3 refills | Status: DC

## 2019-05-08 NOTE — Telephone Encounter (Signed)
Done

## 2019-05-08 NOTE — Patient Instructions (Signed)
We have sent in the clonidine which is the medicine that you will take all the time for blood pressure. Take 1 pill twice a day.   We have also refilled the hydralazine which is the medicine for blood pressure which you can take when eating beef or if blood pressure is still running high. If needed take this 1 pill twice a day.  The goal for the blood pressure is <140/<90.   Come back in about 1-2 months to check in on blood pressure and feel free to call us sooner with questions or if blood pressure if running too high or low.

## 2019-05-08 NOTE — Telephone Encounter (Signed)
New message    The patient was seen today medication was called into [facility] on [address].   Pharmacy unable to filled unless MD called a specific a 90 days    The patient is requesting medication called into [facility]  for  90 days supply

## 2019-05-08 NOTE — Progress Notes (Signed)
   Subjective:   Patient ID: Anthony Atkinson, male    DOB: 1971-03-05, 49 y.o.   MRN: [id number]  HPI The patient is a 49 YO man coming in for follow up blood pressure. Previously on 3 medications and last seen PCP in 2019. Out of most medications lately and BP is running up some. He does feel that previously his BP was running low a lot and the medicines were too much. He does get higher BP if eating pork. He does try to avoid but eats this sometimes. Denies chest pains or SOB.    Review of Systems  Constitutional: Negative.   HENT: Negative.   Eyes: Negative.   Respiratory: Negative for cough, chest tightness and shortness of breath.   Cardiovascular: Negative for chest pain, palpitations and leg swelling.  Gastrointestinal: Negative for abdominal distention, abdominal pain, constipation, diarrhea, nausea and vomiting.  Musculoskeletal: Negative.   Skin: Negative.   Neurological: Negative.   Psychiatric/Behavioral: Negative.     Objective:  Physical Exam Constitutional:      Appearance: He is well-developed.  HENT:     Head: Normocephalic and atraumatic.  Cardiovascular:     Rate and Rhythm: Normal rate and regular rhythm.  Pulmonary:     Effort: Pulmonary effort is normal. No respiratory distress.     Breath sounds: Normal breath sounds. No wheezing or rales.  Abdominal:     General: Bowel sounds are normal. There is no distension.     Palpations: Abdomen is soft.     Tenderness: There is no abdominal tenderness. There is no rebound.  Musculoskeletal:     Cervical back: Normal range of motion.  Skin:    General: Skin is warm and dry.  Neurological:     Mental Status: He is alert and oriented to person, place, and time.     Coordination: Coordination normal.     Vitals:   05/08/19 0806  BP: (!) 142/94  Pulse: 85  Temp: 98.1 F (36.7 C)  TempSrc: Oral  SpO2: 99%  Weight: 191 lb (86.6 kg)  Height: 6\' 1"  (1.854 m)    This visit occurred during the SARS-CoV-2  public health emergency.  Safety protocols were in place, including screening questions prior to the visit, additional usage of staff PPE, and extensive cleaning of exam room while observing appropriate contact time as indicated for disinfecting solutions.   Assessment & Plan:

## 2019-05-10 NOTE — Assessment & Plan Note (Signed)
BP today off pork and off meds is close to normal. Will restart clonidine 0.3 mg BID as this he does well with and use hydralazine as prn only when eating pork 25 mg BID. Come back in 1-2 months for labs and BP check.

## 2019-05-12 ENCOUNTER — Emergency Department (HOSPITAL_COMMUNITY): Payer: 59

## 2019-05-12 ENCOUNTER — Inpatient Hospital Stay (HOSPITAL_COMMUNITY): Payer: 59

## 2019-05-12 ENCOUNTER — Other Ambulatory Visit: Payer: Self-pay

## 2019-05-12 ENCOUNTER — Encounter (HOSPITAL_COMMUNITY): Payer: Self-pay | Admitting: Emergency Medicine

## 2019-05-12 ENCOUNTER — Inpatient Hospital Stay (HOSPITAL_COMMUNITY)
Admission: EM | Admit: 2019-05-12 | Discharge: 2019-05-15 | DRG: 071 | Disposition: A | Discharge status: Home / Self Care | Payer: 59 | Attending: Internal Medicine | Admitting: Internal Medicine

## 2019-05-12 DIAGNOSIS — I160 Hypertensive urgency: Secondary | ICD-10-CM

## 2019-05-12 DIAGNOSIS — I129 Hypertensive chronic kidney disease with stage 1 through stage 4 chronic kidney disease, or unspecified chronic kidney disease: Secondary | ICD-10-CM | POA: Diagnosis present

## 2019-05-12 DIAGNOSIS — N179 Acute kidney failure, unspecified: Secondary | ICD-10-CM | POA: Diagnosis present

## 2019-05-12 DIAGNOSIS — Z79899 Other long term (current) drug therapy: Secondary | ICD-10-CM | POA: Diagnosis not present

## 2019-05-12 DIAGNOSIS — R569 Unspecified convulsions: Secondary | ICD-10-CM | POA: Diagnosis not present

## 2019-05-12 DIAGNOSIS — I6783 Posterior reversible encephalopathy syndrome: Principal | ICD-10-CM

## 2019-05-12 DIAGNOSIS — Z87891 Personal history of nicotine dependence: Secondary | ICD-10-CM | POA: Diagnosis not present

## 2019-05-12 DIAGNOSIS — R9431 Abnormal electrocardiogram [ECG] [EKG]: Secondary | ICD-10-CM | POA: Diagnosis present

## 2019-05-12 DIAGNOSIS — R112 Nausea with vomiting, unspecified: Secondary | ICD-10-CM | POA: Diagnosis not present

## 2019-05-12 DIAGNOSIS — F1290 Cannabis use, unspecified, uncomplicated: Secondary | ICD-10-CM | POA: Diagnosis present

## 2019-05-12 DIAGNOSIS — F12120 Cannabis abuse with intoxication, uncomplicated: Secondary | ICD-10-CM | POA: Diagnosis present

## 2019-05-12 DIAGNOSIS — E872 Acidosis: Secondary | ICD-10-CM | POA: Diagnosis present

## 2019-05-12 DIAGNOSIS — N1831 Chronic kidney disease, stage 3a: Secondary | ICD-10-CM | POA: Diagnosis present

## 2019-05-12 DIAGNOSIS — R1115 Cyclical vomiting syndrome unrelated to migraine: Secondary | ICD-10-CM | POA: Diagnosis present

## 2019-05-12 DIAGNOSIS — I10 Essential (primary) hypertension: Secondary | ICD-10-CM | POA: Diagnosis present

## 2019-05-12 DIAGNOSIS — I161 Hypertensive emergency: Secondary | ICD-10-CM | POA: Diagnosis present

## 2019-05-12 DIAGNOSIS — Z20822 Contact with and (suspected) exposure to covid-19: Secondary | ICD-10-CM | POA: Diagnosis present

## 2019-05-12 DIAGNOSIS — F10239 Alcohol dependence with withdrawal, unspecified: Secondary | ICD-10-CM | POA: Diagnosis present

## 2019-05-12 DIAGNOSIS — E876 Hypokalemia: Secondary | ICD-10-CM | POA: Diagnosis present

## 2019-05-12 DIAGNOSIS — I152 Hypertension secondary to endocrine disorders: Secondary | ICD-10-CM

## 2019-05-12 DIAGNOSIS — E348 Other specified endocrine disorders: Secondary | ICD-10-CM

## 2019-05-12 DIAGNOSIS — F12188 Cannabis abuse with other cannabis-induced disorder: Secondary | ICD-10-CM

## 2019-05-12 LAB — CBC
HCT: 39 % (ref 39.0–52.0)
Hemoglobin: 13.4 g/dL (ref 13.0–17.0)
MCH: 33.8 pg (ref 26.0–34.0)
MCHC: 34.4 g/dL (ref 30.0–36.0)
MCV: 98.5 fL (ref 80.0–100.0)
Platelets: 383 10*3/uL (ref 150–400)
RBC: 3.96 MIL/uL — ABNORMAL LOW (ref 4.22–5.81)
RDW: 14.2 % (ref 11.5–15.5)
WBC: 13.2 10*3/uL — ABNORMAL HIGH (ref 4.0–10.5)
nRBC: 0 % (ref 0.0–0.2)

## 2019-05-12 LAB — URINALYSIS, ROUTINE W REFLEX MICROSCOPIC
Bacteria, UA: NONE SEEN
Bilirubin Urine: NEGATIVE
Glucose, UA: 50 mg/dL — AB
Hgb urine dipstick: NEGATIVE
Ketones, ur: 5 mg/dL — AB
Leukocytes,Ua: NEGATIVE
Nitrite: NEGATIVE
Protein, ur: 100 mg/dL — AB
Specific Gravity, Urine: 1.011 (ref 1.005–1.030)
pH: 6 (ref 5.0–8.0)

## 2019-05-12 LAB — COMPREHENSIVE METABOLIC PANEL
ALT: 17 U/L (ref 0–44)
AST: 27 U/L (ref 15–41)
Albumin: 4.4 g/dL (ref 3.5–5.0)
Alkaline Phosphatase: 50 U/L (ref 38–126)
Anion gap: 18 — ABNORMAL HIGH (ref 5–15)
BUN: 16 mg/dL (ref 6–20)
CO2: 21 mmol/L — ABNORMAL LOW (ref 22–32)
Calcium: 9.5 mg/dL (ref 8.9–10.3)
Chloride: 102 mmol/L (ref 98–111)
Creatinine, Ser: 1.22 mg/dL (ref 0.61–1.24)
GFR calc Af Amer: >60 mL/min (ref >60)
GFR calc non Af Amer: >60 mL/min (ref >60)
Glucose, Bld: 133 mg/dL — ABNORMAL HIGH (ref 70–99)
Potassium: 3.2 mmol/L — ABNORMAL LOW (ref 3.5–5.1)
Sodium: 141 mmol/L (ref 135–145)
Total Bilirubin: 1.3 mg/dL — ABNORMAL HIGH (ref 0.3–1.2)
Total Protein: 8.6 g/dL — ABNORMAL HIGH (ref 6.5–8.1)

## 2019-05-12 LAB — RAPID URINE DRUG SCREEN, HOSP PERFORMED
Amphetamines: NOT DETECTED
Barbiturates: NOT DETECTED
Benzodiazepines: NOT DETECTED
Cocaine: NOT DETECTED
Opiates: NOT DETECTED
Tetrahydrocannabinol: POSITIVE — AB

## 2019-05-12 LAB — CBG MONITORING, ED: Glucose-Capillary: 157 mg/dL — ABNORMAL HIGH (ref 70–99)

## 2019-05-12 LAB — MAGNESIUM: Magnesium: 1.7 mg/dL (ref 1.7–2.4)

## 2019-05-12 LAB — RESPIRATORY PANEL BY RT PCR (FLU A&B, COVID)
Influenza A by PCR: NEGATIVE
Influenza B by PCR: NEGATIVE
SARS Coronavirus 2 by RT PCR: NEGATIVE

## 2019-05-12 LAB — LIPASE, BLOOD: Lipase: 22 U/L (ref 11–51)

## 2019-05-12 MED ORDER — LORAZEPAM 2 MG/ML IJ SOLN
1.0000 mg | Freq: Once | INTRAMUSCULAR | Status: AC
  Administered 2019-05-12: 1 mg via INTRAVENOUS
  Filled 2019-05-12: qty 1

## 2019-05-12 MED ORDER — LORAZEPAM 2 MG/ML IJ SOLN: 1.0000 mg | Freq: Four times a day (QID) | INTRAMUSCULAR | Status: DC

## 2019-05-12 MED ORDER — ONDANSETRON HCL 4 MG/2ML IJ SOLN
4.0000 mg | Freq: Once | INTRAMUSCULAR | Status: AC
  Administered 2019-05-12: 4 mg via INTRAVENOUS
  Filled 2019-05-12: qty 2

## 2019-05-12 MED ORDER — ADULT MULTIVITAMIN W/MINERALS CH
1.0000 | ORAL_TABLET | Freq: Every day | ORAL | Status: DC
  Administered 2019-05-13 – 2019-05-15 (×3): 1 via ORAL
  Filled 2019-05-12 (×3): qty 1

## 2019-05-12 MED ORDER — HYDRALAZINE HCL 10 MG PO TABS: 25.0000 mg | ORAL_TABLET | Freq: Once | ORAL | Status: DC

## 2019-05-12 MED ORDER — LORAZEPAM 2 MG/ML IJ SOLN
1.0000 mg | Freq: Four times a day (QID) | INTRAMUSCULAR | Status: DC
  Administered 2019-05-13 – 2019-05-14 (×2): 1 mg via INTRAVENOUS
  Filled 2019-05-12 (×2): qty 1

## 2019-05-12 MED ORDER — HYDRALAZINE HCL 20 MG/ML IJ SOLN
10.0000 mg | Freq: Once | INTRAMUSCULAR | Status: AC
  Administered 2019-05-12: 10 mg via INTRAVENOUS
  Filled 2019-05-12: qty 1

## 2019-05-12 MED ORDER — LORAZEPAM 2 MG/ML IJ SOLN
2.0000 mg | Freq: Once | INTRAMUSCULAR | Status: AC
  Administered 2019-05-12: 2 mg via INTRAVENOUS
  Filled 2019-05-12: qty 1

## 2019-05-12 MED ORDER — LORAZEPAM 2 MG/ML IJ SOLN: 1.0000 mg | INTRAMUSCULAR | Status: DC | PRN

## 2019-05-12 MED ORDER — SODIUM CHLORIDE 0.9 % IV BOLUS
1000.0000 mL | Freq: Once | INTRAVENOUS | Status: AC
  Administered 2019-05-12: 1000 mL via INTRAVENOUS

## 2019-05-12 MED ORDER — FAMOTIDINE IN NACL 20-0.9 MG/50ML-% IV SOLN
20.0000 mg | Freq: Once | INTRAVENOUS | Status: AC
  Administered 2019-05-12: 20 mg via INTRAVENOUS
  Filled 2019-05-12: qty 50

## 2019-05-12 MED ORDER — POTASSIUM CHLORIDE 10 MEQ/100ML IV SOLN
10.0000 meq | Freq: Once | INTRAVENOUS | Status: AC
  Administered 2019-05-12: 09:00:00 10 meq via INTRAVENOUS
  Filled 2019-05-12: qty 100

## 2019-05-12 MED ORDER — LORAZEPAM 2 MG/ML IJ SOLN
1.0000 mg | Freq: Once | INTRAMUSCULAR | Status: DC
  Filled 2019-05-12: qty 1

## 2019-05-12 MED ORDER — CLONIDINE HCL 0.3 MG/24HR TD PTWK
0.3000 mg | MEDICATED_PATCH | TRANSDERMAL | Status: DC
  Administered 2019-05-12: 0.3 mg via TRANSDERMAL
  Filled 2019-05-12: qty 1

## 2019-05-12 MED ORDER — ENOXAPARIN SODIUM 40 MG/0.4ML ~~LOC~~ SOLN
40.0000 mg | SUBCUTANEOUS | Status: DC
  Administered 2019-05-12 – 2019-05-13 (×2): 40 mg via SUBCUTANEOUS
  Filled 2019-05-12 (×2): qty 0.4

## 2019-05-12 MED ORDER — LORAZEPAM 2 MG/ML IJ SOLN
2.0000 mg | Freq: Four times a day (QID) | INTRAMUSCULAR | Status: AC
  Administered 2019-05-12 – 2019-05-13 (×4): 2 mg via INTRAVENOUS
  Filled 2019-05-12 (×4): qty 1

## 2019-05-12 MED ORDER — SODIUM CHLORIDE 0.9 % IV SOLN
750.0000 mg | Freq: Two times a day (BID) | INTRAVENOUS | Status: DC
  Administered 2019-05-12 – 2019-05-14 (×4): 750 mg via INTRAVENOUS
  Filled 2019-05-12 (×6): qty 7.5

## 2019-05-12 MED ORDER — LEVETIRACETAM IN NACL 500 MG/100ML IV SOLN: 500.0000 mg | Freq: Two times a day (BID) | INTRAVENOUS | Status: DC

## 2019-05-12 MED ORDER — LABETALOL HCL 5 MG/ML IV SOLN
10.0000 mg | INTRAVENOUS | Status: DC | PRN
  Administered 2019-05-12: 10 mg via INTRAVENOUS
  Filled 2019-05-12: qty 4

## 2019-05-12 MED ORDER — LORAZEPAM BOLUS VIA INFUSION: 1.0000 mg | Freq: Four times a day (QID) | INTRAVENOUS | Status: DC

## 2019-05-12 MED ORDER — LEVETIRACETAM IN NACL 1500 MG/100ML IV SOLN
1500.0000 mg | Freq: Once | INTRAVENOUS | Status: AC
  Administered 2019-05-12: 1500 mg via INTRAVENOUS
  Filled 2019-05-12: qty 100

## 2019-05-12 MED ORDER — PANTOPRAZOLE SODIUM 40 MG PO TBEC
40.0000 mg | DELAYED_RELEASE_TABLET | Freq: Every day | ORAL | Status: DC
  Administered 2019-05-13 – 2019-05-15 (×3): 40 mg via ORAL
  Filled 2019-05-12 (×3): qty 1

## 2019-05-12 MED ORDER — STERILE WATER FOR INJECTION IJ SOLN
INTRAMUSCULAR | Status: AC
  Administered 2019-05-12: 10 mL
  Filled 2019-05-12: qty 10

## 2019-05-12 MED ORDER — ZIPRASIDONE MESYLATE 20 MG IM SOLR
10.0000 mg | Freq: Once | INTRAMUSCULAR | Status: AC
  Administered 2019-05-12: 10 mg via INTRAMUSCULAR

## 2019-05-12 MED ORDER — ONDANSETRON HCL 4 MG PO TABS: 4.0000 mg | ORAL_TABLET | Freq: Three times a day (TID) | ORAL | Status: DC | PRN

## 2019-05-12 MED ORDER — LABETALOL HCL 5 MG/ML IV SOLN
20.0000 mg | Freq: Once | INTRAVENOUS | Status: AC
  Administered 2019-05-12: 20 mg via INTRAVENOUS
  Filled 2019-05-12: qty 4

## 2019-05-12 MED ORDER — LACTATED RINGERS IV SOLN: INTRAVENOUS | Status: DC

## 2019-05-12 MED ORDER — LORAZEPAM BOLUS VIA INFUSION
2.0000 mg | Freq: Four times a day (QID) | INTRAVENOUS | Status: DC
  Filled 2019-05-12: qty 2

## 2019-05-12 MED ORDER — CLONIDINE HCL 0.1 MG PO TABS: 0.3000 mg | ORAL_TABLET | Freq: Once | ORAL | Status: DC

## 2019-05-12 MED ORDER — MAGNESIUM SULFATE 2 GM/50ML IV SOLN
2.0000 g | Freq: Once | INTRAVENOUS | Status: AC
  Administered 2019-05-12: 2 g via INTRAVENOUS
  Filled 2019-05-12: qty 50

## 2019-05-12 MED ORDER — LORAZEPAM 2 MG/ML IJ SOLN
1.0000 mg | Freq: Once | INTRAMUSCULAR | Status: AC
  Administered 2019-05-12: 1 mg via INTRAVENOUS

## 2019-05-12 MED ORDER — ZIPRASIDONE MESYLATE 20 MG IM SOLR
INTRAMUSCULAR | Status: AC
  Filled 2019-05-12: qty 20

## 2019-05-12 MED ORDER — SODIUM CHLORIDE 0.9 % IV BOLUS
1000.0000 mL | Freq: Once | INTRAVENOUS | Status: AC
  Administered 2019-05-12: 09:00:00 1000 mL via INTRAVENOUS

## 2019-05-12 NOTE — H&P (Signed)
History and Physical    ATZIN BUCHTA GTX:[id number] DOB: 05/10/1970 DOA: 05/12/2019  Referring MD/NP/PA: Cathren Laine PCP: Myrlene Broker, MD   Patient coming from: Home  Chief Complaint: Nausea and vomiting  HPI: Anthony Atkinson is a 49 y.o. male with medical history significant of hypertension and marijuana use who presented to the ED due to nausea and vomiting. He is altered currently so history is obtained from previous documentation. He started having nausea and vomiting last night. He has had symptoms like this before with possible cyclical vomiting syndrome related to his marijuana use. He described his symptoms as moderate in severity with clear emesis without blood. He also admits to associated loose BMs although denies persistent diarrhea. ROS is unable to be obtained due to mental status.  Of note, spoke to the patient's wife and she reports that a couple weeks ago he had a fall where he hit his head. He did not remember the fall or how he fell and was slightly confused afterwards according to his wife. At that time he went to the ED and was eventually discharged home in stable condition. She confirmed that he has never had seizures before.   ED Course: While in the ED, patient developed severe hypertension with persistent nausea, vomiting and abdominal pain when he had generalized seizure activity. Seizure lasted approximately 1 minute and he had prolonged post ictal period as well. He received IV ativan and keppra IV with continued agitation requiring another dose of ativan at 1 pm. Neurology was consulted and recommended keppra and admission to medicine [facility] for EEG and further evaluation of his seizure as this would be his first seizure.  CT scan also performed and showed no acute abnormalities.   Review of Systems: Unable to obtain due to mental status Past Medical History:  Diagnosis Date  . Esophageal perforation   . Hidradenitis    Bilateral Axilla  .  Hypertension     Past Surgical History:  Procedure Laterality Date  . FACIAL COSMETIC SURGERY  11/2015   cysts removed d/t infected ingrown hairs "3 wks ago" per pt (asked on 12/15/15)  . HYDRADENITIS EXCISION Bilateral 10/27/2016   Procedure: WIDE EXCISION HIDRADENITIS BILATERAL AXILLA;  Surgeon: Abigail Miyamoto, MD;  Location: [facility] OR;  Service: General;  Laterality: Bilateral;     reports that he quit smoking about 3 years ago. His smoking use included cigarettes. He smoked 0.50 packs per day. He has never used smokeless tobacco. He reports current alcohol use. He reports current drug use. Drug: Marijuana.  No Known Allergies  Family History  Problem Relation Age of Onset  . Colon cancer Neg Hx   . Esophageal cancer Neg Hx   . Pancreatic cancer Neg Hx   . Stomach cancer Neg Hx   . Liver disease Neg Hx     Prior to Admission medications   Medication Sig Start Date End Date Taking? Authorizing Provider  cloNIDine (CATAPRES) 0.3 MG tablet Take 1 tablet (0.3 mg total) by mouth 2 (two) times daily. 05/08/19   Myrlene Broker, MD  hydrALAZINE (APRESOLINE) 25 MG tablet Take 1 tablet (25 mg total) by mouth 2 (two) times daily. 05/08/19   Myrlene Broker, MD  omeprazole (PRILOSEC) 20 MG capsule Take 1 capsule (20 mg total) by mouth daily. 05/08/19   Myrlene Broker, MD  ondansetron (ZOFRAN) 4 MG tablet Take 1 tablet (4 mg total) by mouth every 8 (eight) hours as needed for nausea or  vomiting. 05/08/19   Myrlene Broker, MD  sildenafil (VIAGRA) 100 MG tablet TAKE ONE-HALF TO ONE TABLET BY MOUTH DAILY AS NEEDED  FOR ERECTILE DYSFUNCTION. Patient taking differently: Take 25-50 mg by mouth daily as needed for erectile dysfunction.  12/24/18   Myrlene Broker, MD  tretinoin (RETIN-A) 0.05 % cream Apply 1 application topically 2 (two) times daily as needed (wound healing).  09/30/16   [provider]    Physical Exam: Vitals:   05/12/19 1315 05/12/19 1330  05/12/19 1345 05/12/19 1400  BP: (!) 162/108 (!) 159/98 (!) 162/110 (!) 159/102  Pulse: 94 95 (!) 101 94  Resp:  16 (!) 35 (!) 38  Temp:      TempSrc:      SpO2: 97% 99% 96% 93%      Constitutional: NAD, easily agitated, altered Vitals:   05/12/19 1315 05/12/19 1330 05/12/19 1345 05/12/19 1400  BP: (!) 162/108 (!) 159/98 (!) 162/110 (!) 159/102  Pulse: 94 95 (!) 101 94  Resp:  16 (!) 35 (!) 38  Temp:      TempSrc:      SpO2: 97% 99% 96% 93%   Eyes: PERRL, lids and conjunctivae normal ENMT: Mucous membranes are moist. Posterior pharynx clear of any exudate or lesions. Neck: normal, supple, no masses, no thyromegaly Respiratory: clear to auscultation bilaterally, no wheezing, no crackles. Normal respiratory effort. No accessory muscle use.  Cardiovascular: Regular rate and rhythm, no murmurs / rubs / gallops. No extremity edema. 2+ pedal pulses.   Abdomen: no tenderness, no masses palpated. No hepatosplenomegaly. Bowel sounds positive.  Musculoskeletal: no clubbing / cyanosis. No joint deformity upper and lower extremities. Good ROM, no contractures. Normal muscle tone.  Skin: no rashes, lesions, ulcers. No induration Neurologic: CN 2-12 grossly intact. Strength 5/5 in all 4 as he moves all 4 extremities although not following commands Psychiatric: Agitated. Not able to further assess due to mentation.    Labs on Admission: I have personally reviewed following labs and imaging studies  CBC: Recent Labs  Lab 05/12/19 0754  WBC 13.2*  HGB 13.4  HCT 39.0  MCV 98.5  PLT 383   Basic Metabolic Panel: Recent Labs  Lab 05/12/19 0754 05/12/19 0901  NA 141  --   K 3.2*  --   CL 102  --   CO2 21*  --   GLUCOSE 133*  --   BUN 16  --   CREATININE 1.22  --   CALCIUM 9.5  --   MG  --  1.7   GFR: Estimated Creatinine Clearance: 82.8 mL/min (by C-G formula based on SCr of 1.22 mg/dL). Liver Function Tests: Recent Labs  Lab 05/12/19 0754  AST 27  ALT 17  ALKPHOS 50    BILITOT 1.3*  PROT 8.6*  ALBUMIN 4.4   Recent Labs  Lab 05/12/19 0754  LIPASE 22   No results for input(s): AMMONIA in the last 168 hours. Coagulation Profile: No results for input(s): INR, PROTIME in the last 168 hours. Cardiac Enzymes: No results for input(s): CKTOTAL, CKMB, CKMBINDEX, TROPONINI in the last 168 hours. BNP (last 3 results) No results for input(s): PROBNP in the last 8760 hours. HbA1C: No results for input(s): HGBA1C in the last 72 hours. CBG: Recent Labs  Lab 05/12/19 0820  GLUCAP 157*   Lipid Profile: No results for input(s): CHOL, HDL, LDLCALC, TRIG, CHOLHDL, LDLDIRECT in the last 72 hours. Thyroid Function Tests: No results for input(s): TSH, T4TOTAL, FREET4, T3FREE, THYROIDAB  in the last 72 hours. Anemia Panel: No results for input(s): VITAMINB12, FOLATE, FERRITIN, TIBC, IRON, RETICCTPCT in the last 72 hours. Urine analysis:    Component Value Date/Time   COLORURINE STRAW (A) 05/12/2019 0754   APPEARANCEUR CLEAR 05/12/2019 0754   LABSPEC 1.011 05/12/2019 0754   PHURINE 6.0 05/12/2019 0754   GLUCOSEU 50 (A) 05/12/2019 0754   HGBUR NEGATIVE 05/12/2019 0754   BILIRUBINUR NEGATIVE 05/12/2019 0754   KETONESUR 5 (A) 05/12/2019 0754   PROTEINUR 100 (A) 05/12/2019 0754   UROBILINOGEN 0.2 01/06/2015 0328   NITRITE NEGATIVE 05/12/2019 0754   LEUKOCYTESUR NEGATIVE 05/12/2019 0754   Sepsis Labs: @LABRCNTIP (procalcitonin:4,lacticidven:4) ) Recent Results (from the past 240 hour(s))  Respiratory Panel by RT PCR (Flu A&B, Covid) - Nasopharyngeal Swab     Status: None   Collection Time: 05/12/19 11:36 AM   Specimen: Nasopharyngeal Swab  Result Value Ref Range Status   SARS Coronavirus 2 by RT PCR NEGATIVE NEGATIVE Final    Comment: (NOTE) SARS-CoV-2 target nucleic acids are NOT DETECTED. The SARS-CoV-2 RNA is generally detectable in upper respiratoy specimens during the acute phase of infection. The lowest concentration of SARS-CoV-2 viral copies  this assay can detect is 131 copies/mL. A negative result does not preclude SARS-Cov-2 infection and should not be used as the sole basis for treatment or other patient management decisions. A negative result may occur with  improper specimen collection/handling, submission of specimen other than nasopharyngeal swab, presence of viral mutation(s) within the areas targeted by this assay, and inadequate number of viral copies (<131 copies/mL). A negative result must be combined with clinical observations, patient history, and epidemiological information. The expected result is Negative. Fact Sheet for Patients:  05/14/19 Fact Sheet for Healthcare Providers:  https://www.moore.com/ This test is not yet ap proved or cleared by the https://www.young.biz/ FDA and  has been authorized for detection and/or diagnosis of SARS-CoV-2 by FDA under an Emergency Use Authorization (EUA). This EUA will remain  in effect (meaning this test can be used) for the duration of the COVID-19 declaration under Section 564(b)(1) of the Act, 21 U.S.C. section 360bbb-3(b)(1), unless the authorization is terminated or revoked sooner.    Influenza A by PCR NEGATIVE NEGATIVE Final   Influenza B by PCR NEGATIVE NEGATIVE Final    Comment: (NOTE) The Xpert Xpress SARS-CoV-2/FLU/RSV assay is intended as an aid in  the diagnosis of influenza from Nasopharyngeal swab specimens and  should not be used as a sole basis for treatment. Nasal washings and  aspirates are unacceptable for Xpert Xpress SARS-CoV-2/FLU/RSV  testing. Fact Sheet for Patients: [place] Fact Sheet for Healthcare Providers: https://www.moore.com/ This test is not yet approved or cleared by the https://www.young.biz/ FDA and  has been authorized for detection and/or diagnosis of SARS-CoV-2 by  FDA under an Emergency Use Authorization (EUA). This EUA will remain  in  effect (meaning this test can be used) for the duration of the  Covid-19 declaration under Section 564(b)(1) of the Act, 21  U.S.C. section 360bbb-3(b)(1), unless the authorization is  terminated or revoked. Performed at [facility], [address], [place], [place]      Radiological Exams on Admission: CT HEAD WO CONTRAST  Result Date: 05/12/2019 CLINICAL DATA:  Encephalopathy EXAM: CT HEAD WITHOUT CONTRAST TECHNIQUE: Contiguous axial images were obtained from the base of the skull through the vertex without intravenous contrast. COMPARISON:  None. FINDINGS: Brain: No evidence of acute infarction, hemorrhage, hydrocephalus, extra-axial collection or mass lesion/mass  effect. Vascular: No hyperdense vessel or unexpected calcification. Skull: Normal. Negative for fracture or focal lesion. Sinuses/Orbits: No acute finding. Other: None. IMPRESSION: No acute intracranial pathology. Electronically Signed   By: Eddie Candle M.D.   On: 05/12/2019 09:16   DG Chest Port 1 View  Result Date: 05/12/2019 CLINICAL DATA:  Seizure, hypotension EXAM: PORTABLE CHEST 1 VIEW COMPARISON:  04/30/2019 FINDINGS: Mild cardiomegaly. Both lungs are clear. The visualized skeletal structures are unremarkable. IMPRESSION: Mild cardiomegaly without acute abnormality of the lungs in AP portable projection. Electronically Signed   By: Eddie Candle M.D.   On: 05/12/2019 14:00    EKG: Independently reviewed. Sinus tachycardia with mildly prolonged QTc  Assessment/Plan Active Problems:   Malignant hypertension   Nausea & vomiting   Essential hypertension   Cyclic vomiting syndrome   Marijuana use   Seizure ([place])  Seizure - Possible related to hypertensive emergency although as this is his first seizure will have neurology evaluate for underlying causes. - Seizure precautions - CT negative - Ativan available as needed - IV keppra 500 mg BID. Pharmacy consult - Q4H neuro checks -  Replacing low normal magnesium level with 2 g IV  Malignant HTN - Patient with improvement in his BP after treatment with hydralazine and labetaolol in the ED. Will start clonidine patch. - Will have labetalol 10 mg Q4H prn as well when systolic BP >465  N/V - Improved per ED  - Continue zofran as needed  Cyclical vomiting syndrome/marijuana use - Will need counseling on illicit drug use once able   DVT prophylaxis: Lovenox Code Status: Full Family Communication: [place] Disposition Plan: Home pending clinical improvement Consults called: Neurology Admission status: Inpatient  Arlan Organ DO [facility]  05/12/2019, 2:45 PM

## 2019-05-12 NOTE — ED Notes (Signed)
Pt back from MRI and [facility] notified that patient was ready for transport.

## 2019-05-12 NOTE — ED Notes (Signed)
[facility] here for transport to [place].  

## 2019-05-12 NOTE — Progress Notes (Signed)
Brief Pharmacy note:  Pharmacy consulted for drug interaction and monitoring of AEDs None noted Pharmacy will continue to follow  Arley Phenix RPh 05/12/2019, 2:57 PM

## 2019-05-12 NOTE — Consult Note (Addendum)
NAME:  Anthony Atkinson, MRN:  [id number], DOB:  06-10-1970, LOS: 0 ADMISSION DATE:  05/12/2019, CONSULTATION DATE:  05/12/19 REFERRING MD: Cheral Marker  , CHIEF COMPLAINT:  PRES   Brief History   49 year old male who presented with nausea, vomiting abdominal pain and had new onset seizure in the ED.  Hypertensive as he was not able to tolerate home oral antihypertensives.  MRI showed press and PCCM consulted for possible IV antihypertensive infusion.  History of present illness   49 year old male with history of hypertension and likely cannabis hyperemesis who presented with abdominal pain, nausea and vomiting.  He says this began after eating a bad meatball sub yesterday and he was not able to hold down his oral clonidine.  Patient has a history of similar episodes of abdominal pain and vomiting, thought possibly secondary to continued marijuana use.  Patient says he is scheduled for a gastric emptying study soon and that this pain felt similar.  He had some loose stools yesterday, but no bowel movement since presentation to the ED.  He also denies fever, chills, chest pain, chest pressure, numbness, tingling, vision changes.  ED, labs showed mild leukocytosis and hypokalemia, normal lipase.  In the ED, pt had approximately 1 minute tonic-clonic seizure followed by post-ictal state. Head CT negative and patient admitted to Hospitalist service.  Pt received 20 IV labetalol and Hydralazine 10mg  IV and  SBP still 160-180.  MRI showed concern for PRES, so PCCM consulted for possible antihypertensive gtt.  On evaluation, patient is awake and appropriate without agitation and is alert and oriented x4.  He denies any current nausea, vomiting, abdominal pain, headache, vision changes or any other complaints.  Past Medical History   has a past medical history of Esophageal perforation, Hidradenitis, and Hypertension.  Significant Hospital Events   2/28>> admit to hospitalist and transferred to  PCCM  Consults:  Neurology, PCCM  Procedures:    Significant Diagnostic Tests:  2/28 CT head>> no acute findings 2/28 MRI brain>>Pattern of cortical edema consistent with posterior reversible encephalopathy. No evidence of true infarction or hemorrhage.  Micro Data:  2/28 respiratory viral panel>> negative  Antimicrobials:    Interim history/subjective:  Patient was reportedly agitated in the ED and at [facility] long, on arrival to [facility] is alert calm and oriented.  SBP 160, but had not received any antihypertensive medications for about 7 hours  Objective   Blood pressure (!) 181/98, pulse 94, temperature 98.1 F (36.7 C), resp. rate 18, SpO2 99 %.        Intake/Output Summary (Last 24 hours) at 05/12/2019 2225 Last data filed at 05/12/2019 1756 Gross per 24 hour  Intake 50 ml  Output --  Net 50 ml   There were no vitals filed for this visit.  General: Well-nourished male in no acute distress HEENT: MM pink/moist Neuro: Awake, alert, oriented x3 moving all extremities without decreased sensation, no focal neuro deficits CV: s1s2 RRR, no m/r/g PULM: CTAB  GI: soft, bsx4 active , nontender throughout Extremities: warm/dry, no edema  Skin: no rashes or lesions   Resolved Hospital Problem list   Postictal state  Assessment & Plan:   New onset seizure activity likely secondary to uncontrolled hypertension and PRES -Neurology following, goal SBP <140 P: -Cleviprex gtt, also resume home Norvasc and metoprolol, and clonidine patch as started on admission, continue holding hydralazine -Loaded with Keppra, continuing IV Keppra mg  twice daily per neurology recommendations -Neurochecks and seizure precautions -EEG pending -No chest  pain, no signs of ischemia on EKG   Abdominal pain, nausea and vomiting -Resolved at the time of ICU transfer, has a history of recurrent episodes are consistent with presenting symptoms today -Question of cannabis hyperemesis/cyclical  vomiting syndrome -Patient reports outpatient GI follow-up for gastric emptying study   Possible alcohol withdrawal -Waxing and waning agitation, improved at time of ICU transfer P: -Continue CIWA   Prolonged QTC -QTC 517 P: -Hold further Zofran as patient nausea has resolved -Repeat EKG -Mag 1.7, given 2g IV overnight   Best practice:  Diet: N.p.o. Pain/Anxiety/Delirium protocol (if indicated): N/A VAP protocol (if indicated): N/A DVT prophylaxis: Lovenox GI prophylaxis: N/A Glucose control: N/A Mobility: As tolerated Code Status: Full code Family Communication: Patient able to communicate Disposition: ICU  Labs   CBC: Recent Labs  Lab 05/12/19 0754  WBC 13.2*  HGB 13.4  HCT 39.0  MCV 98.5  PLT 383    Basic Metabolic Panel: Recent Labs  Lab 05/12/19 0754 05/12/19 0901  NA 141  --   K 3.2*  --   CL 102  --   CO2 21*  --   GLUCOSE 133*  --   BUN 16  --   CREATININE 1.22  --   CALCIUM 9.5  --   MG  --  1.7   GFR: Estimated Creatinine Clearance: 82.8 mL/min (by C-G formula based on SCr of 1.22 mg/dL). Recent Labs  Lab 05/12/19 0754  WBC 13.2*    Liver Function Tests: Recent Labs  Lab 05/12/19 0754  AST 27  ALT 17  ALKPHOS 50  BILITOT 1.3*  PROT 8.6*  ALBUMIN 4.4   Recent Labs  Lab 05/12/19 0754  LIPASE 22   No results for input(s): AMMONIA in the last 168 hours.  ABG    Component Value Date/Time   HCO3 26.4 05/01/2019 2210   TCO2 20.8 04/23/2015 1615   O2SAT 99.4 05/01/2019 2210     Coagulation Profile: No results for input(s): INR, PROTIME in the last 168 hours.  Cardiac Enzymes: No results for input(s): CKTOTAL, CKMB, CKMBINDEX, TROPONINI in the last 168 hours.  HbA1C: Hgb A1c MFr Bld  Date/Time Value Ref Range Status  03/18/2019 10:44 AM 6.1 4.6 - 6.5 % Final    Comment:    Glycemic Control Guidelines for People with Diabetes:Non Diabetic:  <6%Goal of Therapy: <7%Additional Action Suggested:  >8%     CBG: Recent  Labs  Lab 05/12/19 0820  GLUCAP 157*    Review of Systems:   Negative except as noted in HPI  Past Medical History  He,  has a past medical history of Esophageal perforation, Hidradenitis, and Hypertension.   Surgical History    Past Surgical History:  Procedure Laterality Date  . FACIAL COSMETIC SURGERY  11/2015   cysts removed d/t infected ingrown hairs "3 wks ago" per pt (asked on 12/15/15)  . HYDRADENITIS EXCISION Bilateral 10/27/2016   Procedure: WIDE EXCISION HIDRADENITIS BILATERAL AXILLA;  Surgeon: Abigail Miyamoto, MD;  Location: [facility] OR;  Service: General;  Laterality: Bilateral;     Social History   reports that he quit smoking about 3 years ago. His smoking use included cigarettes. He smoked 0.50 packs per day. He has never used smokeless tobacco. He reports current alcohol use. He reports current drug use. Drug: Marijuana.   Family History   His family history is negative for Colon cancer, Esophageal cancer, Pancreatic cancer, Stomach cancer, and Liver disease.   Allergies No Known Allergies   Home  Medications  Prior to Admission medications   Medication Sig Start Date End Date Taking? Authorizing Provider  cloNIDine (CATAPRES) 0.3 MG tablet Take 1 tablet (0.3 mg total) by mouth 2 (two) times daily. 05/08/19   Myrlene Broker, MD  hydrALAZINE (APRESOLINE) 25 MG tablet Take 1 tablet (25 mg total) by mouth 2 (two) times daily. 05/08/19   Myrlene Broker, MD  omeprazole (PRILOSEC) 20 MG capsule Take 1 capsule (20 mg total) by mouth daily. 05/08/19   Myrlene Broker, MD  ondansetron (ZOFRAN) 4 MG tablet Take 1 tablet (4 mg total) by mouth every 8 (eight) hours as needed for nausea or vomiting. 05/08/19   Myrlene Broker, MD  sildenafil (VIAGRA) 100 MG tablet TAKE ONE-HALF TO ONE TABLET BY MOUTH DAILY AS NEEDED  FOR ERECTILE DYSFUNCTION. Patient taking differently: Take 25-50 mg by mouth daily as needed for erectile dysfunction.  12/24/18   Myrlene Broker, MD  tretinoin (RETIN-A) 0.05 % cream Apply 1 application topically 2 (two) times daily as needed (wound healing).  09/30/16   [provider]     Critical care time: 55 minutes    CRITICAL CARE Performed by: Darcella Gasman Mida Cory   Total critical care time: 55 minutes  Critical care time was exclusive of separately billable procedures and treating other patients.  Critical care was necessary to treat or prevent imminent or life-threatening deterioration.  Critical care was time spent personally by me on the following activities: development of treatment plan with patient and/or surrogate as well as nursing, discussions with consultants, evaluation of patient's response to treatment, examination of patient, obtaining history from patient or surrogate, ordering and performing treatments and interventions, ordering and review of laboratory studies, ordering and review of radiographic studies, pulse oximetry and re-evaluation of patient's condition.  Darcella Gasman Zyrion Coey, PA-C

## 2019-05-12 NOTE — ED Notes (Signed)
As I was about to give IV ativan, he had a tonic-clonic seizure ~ 1 min. In duration. After this seizure activity he was non-verbal and very combative and confused. For his safety a crew was assembled, and pt. Was placed in restraints (see restraint documentation). He was then given further medication and has undergone CT of his head. As I write this, he is resting quietly.

## 2019-05-12 NOTE — ED Notes (Signed)
Pt transported to MRI 

## 2019-05-12 NOTE — ED Notes (Signed)
[facility] dispatch notified for need of transport.  

## 2019-05-12 NOTE — ED Notes (Signed)
Report attempted and was advised by nurse at [facility] to call back in 5 minutes.

## 2019-05-12 NOTE — Progress Notes (Signed)
Pt arrived from [facility], awake ,alert and oriented, answer questions appropraitely. Pupils PERRL  No seizure at this time  But bp high  [facility] and Neurology on call made aware . RN will monitor.

## 2019-05-12 NOTE — Progress Notes (Signed)
Paged by patient placement regarding confusion on patient status upon arrival.  Patient had MRI completed in the emergency department before leaving with transport.  MRI results showed pattern of cortical edema consistent with posterior reversible encephalopathy.  Spoke with bedside RN who had already consulted neurology.  Upon arrival to the floor patient resting in bed hypertensive with a systolic blood pressure into the 170s, patient denies alcohol use but also states he drank Hennessy approximately 2 days ago.  Patient is alert and oriented x2 to self and time.  Spoke with Dr. Otelia Limes of neurology who recommended that the patient be moved to an ICU setting for alcohol withdrawal management, and management of PRES. Per Dr. Shelbie Hutching recommendations he would like the patient placed on a titratable IV antihypertensive instead of using as needed medications to avoid labile blood pressures.  Consulted PCCM regarding situation who stated they would send the ground team to see the patient.  Upon exiting the room patient resting in bed and plan discussed with bedside RN  Denny Levy, APRN-C [facility] Pager: [phone]

## 2019-05-12 NOTE — Progress Notes (Signed)
MRI reveals findings most consistent with PRES. Also with agitation, suggestive of EtOH withdrawal (history of EtOH use).   Recommendations: 1. May need to be in an ICU setting for possible EtOH withdrawal management in conjunction with aggressive BP management.  2. Consider scheduled Ativan 2 mg IV q6h x 1 day then 1 mg IV q6h x 2 days, then PRN with CIWA protocol.  3. SBP goal of < 140 in the setting of PRES. Most likely will need titratable IV antihypertensive for optimal control.  4. Images from MRI reviewed. Discussed case with Dr. Wilford Corner in sign out.   Electronically signed: Dr. Caryl Pina

## 2019-05-12 NOTE — Consult Note (Signed)
Neurology Consultation  Reason for Consult: Seizure-seen at [facility] long hospital Referring Physician: Dr. Iven Finn  CC: Seizures  History is obtained from: Patient, chart  HPI: Anthony Atkinson is a 49 y.o. male past medical history hypertension, esophageal perforation, THC abuse, presented to hospital hospital for evaluation of abdominal pain, which was sudden in onset recurrent and persistent. He had been having symptoms since sometimes yesterday. As he was being evaluated for this abdominal pain, meds been given, he had a generalized tonic-clonic seizure that lasted 1 to 2 minutes. He became very lethargic after this and remained confused and combative requiring multiple staff members to restrain him as well as received Ativan and dose of Geodon. He was loaded with IV Keppra, but continued to be confused agitated. He then following this became very somnolent. He was not coming around for the ER team as quickly as they would expect a 49 year old with first-time seizure and hence neurological consultation was placed. Head CT was done which was unremarkable. Systolic blood pressure was in the 220s.  Is on clonidine but was not able to take it because of nausea and vomiting.  One of his antihypertensives was recently discontinued-lisinopril because his blood pressures were lowered on his outpatient doctors evaluation.  Patient still continues to be drowsy and is not able to provide good history.  I called his wife and spoke with her over the phone. She reports that he has been in the emergency room multiple times for complaints of nausea vomiting and abdominal pain over the past few months.  There was one visit that specifically was different because he had a fall from the bed and had a laceration on his eye.  He was seen in the emergency room, the laceration sutured and sent home. She does not report any seizure-like activity but upon knowing that he had seizure today, was wondering if the  unwitnessed fall at that time could have been a seizure-this was sometime in early part of January. According to the wife, he has been excessively abusing marijuana.  He was also abusing tobacco before but he has given up cigarette smoking for now but she is unable to get him to quit marijuana. She reports that both of them got food poisoning a couple of days ago over the weekend, she has started recover well but he was not feeling well and at 4 AM became very uncomfortable with his nausea and vomiting and decided that he needs to come to the emergency room.  She dropped her to the emergency room and left and was not able to provide much information to the team caring for him and is very frustrated on multiple ER visits without any answers.    ROS: Unable to obtain due to altered mental status.   Past Medical History:  Diagnosis Date  . Esophageal perforation   . Hidradenitis    Bilateral Axilla  . Hypertension     Family History  Problem Relation Age of Onset  . Colon cancer Neg Hx   . Esophageal cancer Neg Hx   . Pancreatic cancer Neg Hx   . Stomach cancer Neg Hx   . Liver disease Neg Hx     Social History:   reports that he quit smoking about 3 years ago. His smoking use included cigarettes. He smoked 0.50 packs per day. He has never used smokeless tobacco. He reports current alcohol use. He reports current drug use. Drug: Marijuana. Excessive marijuana use  Medications  Current Facility-Administered Medications:  .  cloNIDine (CATAPRES - Dosed in mg/24 hr) patch 0.3 mg, 0.3 mg, Transdermal, Weekly, Bell, Thomas N, DO, 0.3 mg at 05/12/19 1405 .  enoxaparin (LOVENOX) injection 40 mg, 40 mg, Subcutaneous, Q24H, Bell, Thomas N, DO .  labetalol (NORMODYNE) injection 10 mg, 10 mg, Intravenous, Q4H PRN, Bell, Thomas N, DO .  lactated ringers infusion, , Intravenous, Continuous, Bell, Thomas N, DO .  levETIRAcetam (KEPPRA) IVPB 500 mg/100 mL premix, 500 mg, Intravenous, Q12H, Bell,  Thomas N, DO .  LORazepam (ATIVAN) injection 1-2 mg, 1-2 mg, Intravenous, Q2H PRN, Bell, Thomas N, DO .  magnesium sulfate IVPB 2 g 50 mL, 2 g, Intravenous, Once, Bell, Thomas N, DO .  multivitamin with minerals tablet 1 tablet, 1 tablet, Oral, Daily, Bell, Thomas N, DO .  ondansetron (ZOFRAN) tablet 4 mg, 4 mg, Oral, Q8H PRN, Bell, Thomas N, DO .  pantoprazole (PROTONIX) EC tablet 40 mg, 40 mg, Oral, Daily, Pollyann Savoy, DO  Current Outpatient Medications:  .  cloNIDine (CATAPRES) 0.3 MG tablet, Take 1 tablet (0.3 mg total) by mouth 2 (two) times daily., Disp: 180 tablet, Rfl: 3 .  hydrALAZINE (APRESOLINE) 25 MG tablet, Take 1 tablet (25 mg total) by mouth 2 (two) times daily., Disp: 180 tablet, Rfl: 3 .  omeprazole (PRILOSEC) 20 MG capsule, Take 1 capsule (20 mg total) by mouth daily., Disp: 90 capsule, Rfl: 3 .  ondansetron (ZOFRAN) 4 MG tablet, Take 1 tablet (4 mg total) by mouth every 8 (eight) hours as needed for nausea or vomiting., Disp: 60 tablet, Rfl: 2 .  sildenafil (VIAGRA) 100 MG tablet, TAKE ONE-HALF TO ONE TABLET BY MOUTH DAILY AS NEEDED  FOR ERECTILE DYSFUNCTION. (Patient taking differently: Take 25-50 mg by mouth daily as needed for erectile dysfunction. ), Disp: 48 tablet, Rfl: 11 .  tretinoin (RETIN-A) 0.05 % cream, Apply 1 application topically 2 (two) times daily as needed (wound healing). , Disp: , Rfl:   Exam: Current vital signs: BP (!) 148/99   Pulse (!) 105   Temp 99.6 F (37.6 C) (Rectal)   Resp (!) 23   SpO2 94%  Vital signs in last 24 hours: Temp:  [97.9 F (36.6 C)-99.6 F (37.6 C)] 99.6 F (37.6 C) (02/28 1545) Pulse Rate:  [83-110] 105 (02/28 1445) Resp:  [16-38] 23 (02/28 1445) BP: (148-208)/(92-140) 148/99 (02/28 1445) SpO2:  [92 %-100 %] 94 % (02/28 1445) General: Drowsy, wakes to voice, in no apparent discomfort. HEENT: Normocephalic atraumatic Cardiovascular regular rate rhythm Respiratory: Clear to auscultation and saturating well on room  air Extremities warm well perfused without edema Abdomen nondistended nontender Neurological exam He is drowsy, opens eyes to voice, follows all commands. Is able to tell me his name and age.  Not able to tell me where he is right now.-The caveat is that he did receive multiple doses of Ativan and 1 dose of Geodon. His speech is clear.  Very poor attention concentration and keeps falling asleep during the interview.  Could not reliably assess repetition and naming. Cranial nerves: Pupils equal round react light, extraocular movements intact, visual fields full, face appears symmetric, shoulder shrug intact, tongue and palate midline. Motor exam: He is antigravity full-strength in all 4 extremities. Sensory exam: Intact to touch all over Coordination: Kept falling asleep and did not perform-no gross ataxia based on reach for his gown and adjusting himself in the bed.  Could not perform formal assessment. Gait testing deferred due to mental status  Labs I have reviewed  labs in epic and the results pertinent to this consultation are:  CBC    Component Value Date/Time   WBC 13.2 (H) 05/12/2019 0754   RBC 3.96 (L) 05/12/2019 0754   HGB 13.4 05/12/2019 0754   HCT 39.0 05/12/2019 0754   PLT 383 05/12/2019 0754   MCV 98.5 05/12/2019 0754   MCH 33.8 05/12/2019 0754   MCHC 34.4 05/12/2019 0754   RDW 14.2 05/12/2019 0754   LYMPHSABS 1.6 05/01/2019 2210   MONOABS 0.7 05/01/2019 2210   EOSABS 0.0 05/01/2019 2210   BASOSABS 0.0 05/01/2019 2210    CMP     Component Value Date/Time   NA 141 05/12/2019 0754   K 3.2 (L) 05/12/2019 0754   CL 102 05/12/2019 0754   CO2 21 (L) 05/12/2019 0754   GLUCOSE 133 (H) 05/12/2019 0754   BUN 16 05/12/2019 0754   CREATININE 1.22 05/12/2019 0754   CALCIUM 9.5 05/12/2019 0754   PROT 8.6 (H) 05/12/2019 0754   ALBUMIN 4.4 05/12/2019 0754   AST 27 05/12/2019 0754   ALT 17 05/12/2019 0754   ALKPHOS 50 05/12/2019 0754   BILITOT 1.3 (H) 05/12/2019 0754    GFRNONAA >60 05/12/2019 0754   GFRAA >60 05/12/2019 0754    Imaging I have reviewed the images obtained:  CT-scan of the head with no acute changes.  No bleed.  No evidence of evolving stroke  Assessment: 49 year old with first-time seizure in the emergency room. Being evaluated for abdominal pain and cyclical vomiting.  Has a history of excessive marijuana abuse.  Also has had nausea and vomiting now for months for which she has had multiple ER visits. There has been an episode where he had a fall in the house unwitnessed where he lacerated his eye without remembering. Other than that, no other seizure episodes or 6 episodes suspicious for seizure have been reported by his wife. At this time, my suspicion is that this is probably had dehydration and hypertensive emergency and possibly provoked seizure from his underlying systemic illness. There is no neck stiffness, no fever, he has only mild leukocytosis-CNS infection should be in the differentials but is not on the top of my differential. He has been loaded with Keppra, I would continue that for now. Cannabis intoxication is well associated and reported with seizures and I suspect that this is also contributing to his current seizure.   Impression: New onset seizure-likely hypertensive emergency versus cannabis intoxication Abdominal pain-etiology under investigation Hypertensive emergency  Recommendations: -I would recommend patient be transferred to [facility] should he require emergent EEG or long-term EEG-both facilities not available at [facility] long hospital. -Obtain a routine EEG.  If has another seizure, would want a stat EEG and possible LTM. -I agree with Dr. Denton Lank loading him with Keppra 1500 mg IV. -Continue Keppra 750 twice daily. -Maintain seizure precautions -Keep him on a stepdown unit with close neurological observation -Management of abdominal pain nausea vomiting per primary team -Watch for any signs of  withdrawal-unclear how much alcohol he drinks or not but I would watch him closely. -I would also obtain MRI of the brain with and without contrast for further evaluation to ensure there is no structural lesion.  Neurology will follow with you. Spoke to and discussed the plan with the wife.  Also d/w Dr. Denton Lank.  -- Milon Dikes, MD [facility] Pager: [phone] If 7pm to 7am, please call on call as listed on AMION.

## 2019-05-12 NOTE — ED Triage Notes (Signed)
Pt c/o abd pains with n/v that started yesterday.

## 2019-05-12 NOTE — ED Notes (Addendum)
He had again become very agitated, attempting to get up and pulling at lines. We clean and dry him and medicate him. He again is able to use the urinal to void. Also, I remove his restraints at this time, as they seem to aggravate him more than help.

## 2019-05-12 NOTE — ED Provider Notes (Addendum)
[facility] DEPT Provider Note   CSN: [id number] Arrival date & time: 05/12/19  4562     History Chief Complaint  Patient presents with  . Emesis  . Abdominal Pain    Anthony Atkinson is a 49 y.o. male.  Patient c/o nausea and vomiting onset last PM. Symptoms acute onset, moderate, episodic, persistent, recurrent. Emesis clear or color recently ingested liquids, no bloody or bilious emesis. Had loose bm last PM, no severe diarrhea. No abd distension. Also w dull, burning epigastric pain, constant, moderate, non radiating. Denies hx pud. No hx gallstones or pancreatitis. + hx THC use, and appears to have hx recurrent vomiting syndrome. No fever or chills. No chest pain or sob.   The history is provided by the patient.  Emesis Associated symptoms: abdominal pain   Associated symptoms: no cough, no fever, no headaches and no sore throat   Abdominal Pain Associated symptoms: nausea and vomiting   Associated symptoms: no chest pain, no cough, no dysuria, no fever, no shortness of breath and no sore throat        Past Medical History:  Diagnosis Date  . Esophageal perforation   . Hidradenitis    Bilateral Axilla  . Hypertension     Patient Active Problem List   Diagnosis Date Noted  . Hyperglycemia 03/18/2019  . Epigastric discomfort 03/18/2019  . Gastroesophageal reflux disease 03/18/2019  . Marijuana use 12/26/2017  . Cyclic vomiting syndrome   . History of esophageal disorder   . Chronic diastolic CHF (congestive heart failure) ([place])   . Essential hypertension   . Panlobular emphysema ([place])   . Nausea & vomiting   . 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 Tobacco abuse 02/13/2015    Past Surgical History:  Procedure Laterality Date  . FACIAL COSMETIC SURGERY  11/2015   cysts removed d/t infected ingrown hairs "3 wks ago" per pt (asked on 12/15/15)  . HYDRADENITIS EXCISION Bilateral 10/27/2016   Procedure: WIDE EXCISION HIDRADENITIS  BILATERAL AXILLA;  Surgeon: Coralie Keens, MD;  Location: [facility] OR;  Service: General;  Laterality: Bilateral;       Family History  Problem Relation Age of Onset  . Colon cancer Neg Hx   . Esophageal cancer Neg Hx   . Pancreatic cancer Neg Hx   . Stomach cancer Neg Hx   . Liver disease Neg Hx     Social History   Tobacco Use  . Smoking status: Former Smoker    Packs/day: 0.50    Types: Cigarettes    Quit date: 12/14/2015    Years since quitting: 3.4  . Smokeless tobacco: Never Used  Substance Use Topics  . Alcohol use: Yes    Comment: occasional  . Drug use: Yes    Types: Marijuana    Comment: 1/day per pt    Home Medications Prior to Admission medications   Medication Sig Start Date End Date Taking? Authorizing Provider  cloNIDine (CATAPRES) 0.3 MG tablet Take 1 tablet (0.3 mg total) by mouth 2 (two) times daily. 05/08/19   Hoyt Koch, MD  hydrALAZINE (APRESOLINE) 25 MG tablet Take 1 tablet (25 mg total) by mouth 2 (two) times daily. 05/08/19   Hoyt Koch, MD  omeprazole (PRILOSEC) 20 MG capsule Take 1 capsule (20 mg total) by mouth daily. 05/08/19   Hoyt Koch, MD  ondansetron (ZOFRAN) 4 MG tablet Take 1 tablet (4 mg total) by mouth every 8 (eight) hours as needed for nausea or vomiting. 05/08/19  Myrlene Broker, MD  sildenafil (VIAGRA) 100 MG tablet TAKE ONE-HALF TO ONE TABLET BY MOUTH DAILY AS NEEDED  FOR ERECTILE DYSFUNCTION. Patient taking differently: Take 25-50 mg by mouth daily as needed for erectile dysfunction.  12/24/18   Myrlene Broker, MD  tretinoin (RETIN-A) 0.05 % cream Apply 1 application topically 2 (two) times daily as needed (wound healing).  09/30/16   [provider]    Allergies    Patient has no known allergies.  Review of Systems   Review of Systems  Constitutional: Negative for fever.  HENT: Negative for sore throat.   Eyes: Negative for redness.  Respiratory: Negative for cough and  shortness of breath.   Cardiovascular: Negative for chest pain.  Gastrointestinal: Positive for abdominal pain, nausea and vomiting.  Endocrine: Negative for polyuria.  Genitourinary: Negative for dysuria and flank pain.  Musculoskeletal: Negative for back pain and neck pain.  Skin: Negative for rash.  Neurological: Negative for headaches.  Hematological: Does not bruise/bleed easily.  Psychiatric/Behavioral: Negative for confusion.    Physical Exam Updated Vital Signs BP (!) 159/120 Comment: pt moving around  Pulse 83   Resp 19   SpO2 100%   Physical Exam Vitals and nursing note reviewed.  Constitutional:      Appearance: Normal appearance. He is well-developed.  HENT:     Head: Atraumatic.     Nose: Nose normal.     Mouth/Throat:     Mouth: Mucous membranes are moist.     Pharynx: Oropharynx is clear.  Eyes:     General: No scleral icterus.    Conjunctiva/sclera: Conjunctivae normal.     Pupils: Pupils are equal, round, and reactive to light.  Neck:     Trachea: No tracheal deviation.     Comments: No neck pain, stiffness or rigidity.  Cardiovascular:     Rate and Rhythm: Normal rate and regular rhythm.     Pulses: Normal pulses.     Heart sounds: Normal heart sounds. No murmur. No friction rub. No gallop.   Pulmonary:     Effort: Pulmonary effort is normal. No accessory muscle usage or respiratory distress.     Breath sounds: Normal breath sounds.  Abdominal:     General: Bowel sounds are normal. There is no distension.     Palpations: Abdomen is soft. There is no mass.     Tenderness: There is no abdominal tenderness. There is no guarding or rebound.     Hernia: No hernia is present.  Genitourinary:    Comments: No cva tenderness. Musculoskeletal:        General: No swelling.     Cervical back: Normal range of motion and neck supple. No rigidity.     Right lower leg: No edema.     Left lower leg: No edema.  Skin:    General: Skin is warm and dry.      Findings: No rash.  Neurological:     Mental Status: He is alert.     Comments: Alert, speech clear. Motor/sens grossly intact. Steady gait.   Psychiatric:     Comments: Anxious appearing.      ED Results / Procedures / Treatments   Labs (all labs ordered are listed, but only abnormal results are displayed) Results for orders placed or performed during the hospital encounter of 05/12/19  Respiratory Panel by RT PCR (Flu A&B, Covid) - Nasopharyngeal Swab   Specimen: Nasopharyngeal Swab  Result Value Ref Range   SARS Coronavirus 2  by RT PCR NEGATIVE NEGATIVE   Influenza A by PCR NEGATIVE NEGATIVE   Influenza B by PCR NEGATIVE NEGATIVE  CBC  Result Value Ref Range   WBC 13.2 (H) 4.0 - 10.5 K/uL   RBC 3.96 (L) 4.22 - 5.81 MIL/uL   Hemoglobin 13.4 13.0 - 17.0 g/dL   HCT 94.4 96.7 - 59.1 %   MCV 98.5 80.0 - 100.0 fL   MCH 33.8 26.0 - 34.0 pg   MCHC 34.4 30.0 - 36.0 g/dL   RDW 63.8 46.6 - 59.9 %   Platelets 383 150 - 400 K/uL   nRBC 0.0 0.0 - 0.2 %  Comprehensive metabolic panel  Result Value Ref Range   Sodium 141 135 - 145 mmol/L   Potassium 3.2 (L) 3.5 - 5.1 mmol/L   Chloride 102 98 - 111 mmol/L   CO2 21 (L) 22 - 32 mmol/L   Glucose, Bld 133 (H) 70 - 99 mg/dL   BUN 16 6 - 20 mg/dL   Creatinine, Ser 3.57 0.61 - 1.24 mg/dL   Calcium 9.5 8.9 - 01.7 mg/dL   Total Protein 8.6 (H) 6.5 - 8.1 g/dL   Albumin 4.4 3.5 - 5.0 g/dL   AST 27 15 - 41 U/L   ALT 17 0 - 44 U/L   Alkaline Phosphatase 50 38 - 126 U/L   Total Bilirubin 1.3 (H) 0.3 - 1.2 mg/dL   GFR calc non Af Amer >60 >60 mL/min   GFR calc Af Amer >60 >60 mL/min   Anion gap 18 (H) 5 - 15  Lipase, blood  Result Value Ref Range   Lipase 22 11 - 51 U/L  Urinalysis, Routine w reflex microscopic  Result Value Ref Range   Color, Urine STRAW (A) YELLOW   APPearance CLEAR CLEAR   Specific Gravity, Urine 1.011 1.005 - 1.030   pH 6.0 5.0 - 8.0   Glucose, UA 50 (A) NEGATIVE mg/dL   Hgb urine dipstick NEGATIVE NEGATIVE    Bilirubin Urine NEGATIVE NEGATIVE   Ketones, ur 5 (A) NEGATIVE mg/dL   Protein, ur 793 (A) NEGATIVE mg/dL   Nitrite NEGATIVE NEGATIVE   Leukocytes,Ua NEGATIVE NEGATIVE   RBC / HPF 0-5 0 - 5 RBC/hpf   WBC, UA 0-5 0 - 5 WBC/hpf   Bacteria, UA NONE SEEN NONE SEEN   Mucus PRESENT   Rapid urine drug screen (hospital performed)  Result Value Ref Range   Opiates NONE DETECTED NONE DETECTED   Cocaine NONE DETECTED NONE DETECTED   Benzodiazepines NONE DETECTED NONE DETECTED   Amphetamines NONE DETECTED NONE DETECTED   Tetrahydrocannabinol POSITIVE (A) NONE DETECTED   Barbiturates NONE DETECTED NONE DETECTED  Magnesium  Result Value Ref Range   Magnesium 1.7 1.7 - 2.4 mg/dL  CBG monitoring, ED  Result Value Ref Range   Glucose-Capillary 157 (H) 70 - 99 mg/dL   DG Chest Port 1 View  Result Date: 04/30/2019 CLINICAL DATA:  Vomiting. EXAM: PORTABLE CHEST 1 VIEW COMPARISON:  12/15/2015 FINDINGS: The heart size and mediastinal contours are within normal limits. Both lungs are clear. The visualized skeletal structures are unremarkable. IMPRESSION: No active disease. Electronically Signed   By: Katherine Mantle M.D.   On: 04/30/2019 20:24    ED ECG REPORT   Date: 05/12/2019  Rate: 109  Rhythm: sinus tachycardia  QRS Axis: left  Intervals: QT prolonged  ST/T Wave abnormalities: nonspecific ST changes  Conduction Disutrbances:none  Narrative Interpretation:   Old EKG Reviewed: changes noted  I  have personally reviewed the EKG tracing    Radiology CT HEAD WO CONTRAST  Result Date: 05/12/2019 CLINICAL DATA:  Encephalopathy EXAM: CT HEAD WITHOUT CONTRAST TECHNIQUE: Contiguous axial images were obtained from the base of the skull through the vertex without intravenous contrast. COMPARISON:  None. FINDINGS: Brain: No evidence of acute infarction, hemorrhage, hydrocephalus, extra-axial collection or mass lesion/mass effect. Vascular: No hyperdense vessel or unexpected calcification. Skull:  Normal. Negative for fracture or focal lesion. Sinuses/Orbits: No acute finding. Other: None. IMPRESSION: No acute intracranial pathology. Electronically Signed   By: Lauralyn Primes M.D.   On: 05/12/2019 09:16    Procedures Procedures (including critical care time)  Medications Ordered in ED Medications  sodium chloride 0.9 % bolus 1,000 mL (has no administration in time range)  famotidine (PEPCID) IVPB 20 mg premix (has no administration in time range)  ondansetron (ZOFRAN) injection 4 mg (has no administration in time range)  LORazepam (ATIVAN) injection 1 mg (has no administration in time range)    ED Course  I have reviewed the triage vital signs and the nursing notes.  Pertinent labs & imaging results that were available during my care of the patient were reviewed by me and considered in my medical decision making (see chart for details).    MDM Rules/Calculators/A&P                      Iv ns bolus. pepcid iv, zofran iv, ativan 1 mg iv.   Labs sent.  Reviewed nursing notes and prior charts for additional history. Patient w multiple prior evaluations for nausea, vomiting and abd pain. Prior CTs neg for acute abd process. Prior UDS + thc, and hx ongoing THC use - suspect THC associated hyperemesis and abd pain syndrome. Will hydrate and provide symptom relief.  Initial bp high. No headache or neuro c/o. Pt with abd pain, nausea, and very anxious. Repeat vitals ordered post initial meds.  RN indicates when at bedside, establishing IV and about to give meds, patient had acute onset of generalized seizure. I was called to room, as I arrived - pt with generalized seizure activity, RN had recently given the ativan, and seizure stopped. RN indicates seizure had lasted approximately 1 minute. Patient post ictal, confused, combative, requiring multi staff to support patient, and restrain for safety. CBG 157. Patient remains very agitated, trying to remove monitoring, iv, grabbing/fighting w  staff - continual reassurance provided patient, and additional ativan iv, and geodon 10 mg iv for symptom improvement.  Keppra IV ordered.   Patient becomes calmer, more cooperative. Restraints removed. Repeat vitals and imaging ordered. Perrl. EOMI. No neck stiffness or rigidity. Spine nt. Chest cta. abd soft nt. No new c/o or new pain. Motor intact bil, strength 5/5. Sensation grossly intact. Moves bil extremities purposefully, follows commands.   Labs reviewed/interpreted by me - k low, 3.2. Mg added to labs. kcl iv. kcl po.   CT reviewed/interpreted by me - no acute hem.   Recheck pt, alert, content. No new c/o.   CRITICAL CARE RE: acute onset/new onset seizure, seizure requiring IV medications, postictal period with agitation/aggressive behavior requiring restraint/medication management, uncontrolled htn/hypertensive urgency.  Performed by: Suzi Roots Total critical care time: 110 minutes Critical care time was exclusive of separately billable procedures and treating other patients. Critical care was necessary to treat or prevent imminent or life-threatening deterioration. Critical care was time spent personally by me on the following activities: development of treatment plan with patient and/or  surrogate as well as nursing, discussions with consultants, evaluation of patient's response to treatment, examination of patient, obtaining history from patient or surrogate, ordering and performing treatments and interventions, ordering and review of laboratory studies, ordering and review of radiographic studies, pulse oximetry and re-evaluation of patient's condition.  Recheck, bp high. Pt is now following commands, re-directable. No new c/o. No headache.  On BP meds at home, has not had today. Clonidine po. Hydralazine po.  Episode nv post oral meds. Clonidine patch. Iv meds for bp.   Additional rechecks, bp remains high, iv medication given.   ?whether due to recurrent nv at home for 12+  hours pta, whether abrupt cessation clonidine is contributing to symptoms ? Due to catechol/adrenergic excess, further exacerbated by cannabinoid hyperemesis/anxiety and related adrenergic symptoms - resultant v high bp/hypertensive urgency, and seizure.   Neurology consulted re new onset seizure, post ictal period, uncontrolled htn,  - discussed w Dr Wilford Corner - he agrees w Keppra, requests admit to hospitalist service at [facility] - he will see in consult.   Hospitalists consulted for admission to [facility].  Serial rechecks of bp - currently improved w meds, 159/98.  On recheck of patient no new c/o, no headache. No neck stiffness or rigidity. Afebrile. Chest cta. abd soft nt. Motor/sens intact bil.   Final Clinical Impression(s) / ED Diagnoses Final diagnoses:  None    Rx / DC Orders ED Discharge Orders    None           Cathren Laine, MD 05/12/19 1352

## 2019-05-12 NOTE — ED Notes (Signed)
He asks for a urinal, and I assist him with its use. He is successful at voiding. His speech is intelligible, however, he remains drowsy and continues to pull on lines and attempts to get up. I insure that he is clean and dry.

## 2019-05-13 DIAGNOSIS — I10 Essential (primary) hypertension: Secondary | ICD-10-CM

## 2019-05-13 DIAGNOSIS — I6783 Posterior reversible encephalopathy syndrome: Secondary | ICD-10-CM

## 2019-05-13 LAB — GLUCOSE, CAPILLARY
Glucose-Capillary: 104 mg/dL — ABNORMAL HIGH (ref 70–99)
Glucose-Capillary: 111 mg/dL — ABNORMAL HIGH (ref 70–99)
Glucose-Capillary: 92 mg/dL (ref 70–99)
Glucose-Capillary: 99 mg/dL (ref 70–99)
Glucose-Capillary: 99 mg/dL (ref 70–99)

## 2019-05-13 LAB — MRSA PCR SCREENING: MRSA by PCR: NEGATIVE

## 2019-05-13 MED ORDER — THIAMINE HCL 100 MG PO TABS
100.0000 mg | ORAL_TABLET | Freq: Every day | ORAL | Status: DC
  Administered 2019-05-14 – 2019-05-15 (×2): 100 mg via ORAL
  Filled 2019-05-13 (×2): qty 1

## 2019-05-13 MED ORDER — AMLODIPINE BESYLATE 10 MG PO TABS
10.0000 mg | ORAL_TABLET | Freq: Every day | ORAL | Status: DC
  Administered 2019-05-13 – 2019-05-15 (×3): 10 mg via ORAL
  Filled 2019-05-13 (×3): qty 1

## 2019-05-13 MED ORDER — CLEVIDIPINE BUTYRATE 0.5 MG/ML IV EMUL
0.0000 mg/h | INTRAVENOUS | Status: DC
  Administered 2019-05-13 (×2): 5 mg/h via INTRAVENOUS
  Filled 2019-05-13 (×2): qty 50

## 2019-05-13 MED ORDER — CHLORHEXIDINE GLUCONATE 0.12 % MT SOLN
15.0000 mL | Freq: Two times a day (BID) | OROMUCOSAL | Status: DC
  Administered 2019-05-13 – 2019-05-15 (×3): 15 mL via OROMUCOSAL
  Filled 2019-05-13 (×3): qty 15

## 2019-05-13 MED ORDER — THIAMINE HCL 100 MG/ML IJ SOLN
100.0000 mg | Freq: Every day | INTRAMUSCULAR | Status: DC
  Administered 2019-05-13: 100 mg via INTRAVENOUS
  Filled 2019-05-13: qty 2

## 2019-05-13 MED ORDER — FOLIC ACID 1 MG PO TABS
1.0000 mg | ORAL_TABLET | Freq: Every day | ORAL | Status: DC
  Administered 2019-05-13 – 2019-05-15 (×3): 1 mg via ORAL
  Filled 2019-05-13 (×3): qty 1

## 2019-05-13 MED ORDER — CHLORHEXIDINE GLUCONATE CLOTH 2 % EX PADS
6.0000 | MEDICATED_PAD | Freq: Every day | CUTANEOUS | Status: DC
  Administered 2019-05-13 – 2019-05-15 (×3): 6 via TOPICAL

## 2019-05-13 NOTE — Progress Notes (Signed)
Patient belongings inventory  1x sweater  1x sweatpants  1x pair of socks 1 x underwear  1 x pair of sneakers  1 x t shirts Earrings in a specimen cup  Patient has his phone and wallet by the bed side

## 2019-05-13 NOTE — Progress Notes (Addendum)
Neurology Progress Note   S:// Patient is lying in bed, awake and alert in NAD.  Patients wife is at bedside.  Patient is still on 5mg  of cleviprex IV with SBP124's.  He tells me today [birth year]. He denies any complaints All of patient's and wife's questions answered to the best of our ability. They both express understanding.   O:// Current vital signs: BP (!) 133/96   Pulse (!) 107   Temp 100.1 F (37.8 C) (Axillary)   Resp (!) 22   SpO2 100%  Vital signs in last 24 hours: Temp:  [98.1 F (36.7 C)-100.1 F (37.8 C)] 100.1 F (37.8 C) (03/01 0800) Pulse Rate:  [29-128] 107 (03/01 1045) Resp:  [14-38] 22 (03/01 1045) BP: (103-208)/(41-140) 133/96 (03/01 1045) SpO2:  [90 %-100 %] 100 % (03/01 1045)   GENERAL: Awake, alert in NAD HEENT: - Normocephalic and atraumatic, dry mm LUNGS - Clear to auscultation bilaterally with no wheezes CV - S1S2 RRR, no m/r/g, equal pulses bilaterally. ABDOMEN - Soft, nontender, nondistended with normoactive BS Ext: warm, well perfused, intact peripheral pulses, no edema  NEURO:  Mental Status: AA&Ox3  Language: speech is clear.  Naming, repetition, fluency, and comprehension intact. Cranial Nerves: PERRL 2 mm/brisk. EOMI, visual fields full, no facial asymmetry, facial sensation intact, hearing intact, tongue/uvula/soft palate midline, normal sternocleidomastoid and trapezius muscle strength. No evidence of tongue atrophy or fibrillations Motor: 5/5 in all 4 extremities. Tone: is normal and bulk is normal Sensation- Intact to light touch bilaterally Coordination: FTN intact bilaterally, no ataxia in BLE. Gait- deferred   Medications  Current Facility-Administered Medications:  .  amLODipine (NORVASC) tablet 10 mg, 10 mg, Oral, Daily, Gleason, 06-08-1969, PA-C, 10 mg at 05/13/19 1054 .  chlorhexidine (PERIDEX) 0.12 % solution 15 mL, 15 mL, Mouth Rinse, BID, Gleason, 07/13/19, PA-C .  Chlorhexidine Gluconate Cloth 2 % PADS 6 each, 6 each,  Topical, Daily, Darcella Gasman, MD .  clevidipine (CLEVIPREX) infusion 0.5 mg/mL, 0-21 mg/hr, Intravenous, Continuous, Caryl Pina, MD, Last Rate: 10 mL/hr at 05/13/19 1000, 5 mg/hr at 05/13/19 1000 .  cloNIDine (CATAPRES - Dosed in mg/24 hr) patch 0.3 mg, 0.3 mg, Transdermal, Weekly, Bell, Thomas N, DO, 0.3 mg at 05/12/19 1405 .  enoxaparin (LOVENOX) injection 40 mg, 40 mg, Subcutaneous, Q24H, Bell, Thomas N, DO, 40 mg at 05/12/19 2209 .  labetalol (NORMODYNE) injection 10 mg, 10 mg, Intravenous, Q4H PRN, 2210, DO, 10 mg at 05/12/19 2210 .  lactated ringers infusion, , Intravenous, Continuous, Bell03/02/21, DO, Last Rate: 125 mL/hr at 05/13/19 1055, New Bag at 05/13/19 1055 .  levETIRAcetam (KEPPRA) 750 mg in sodium chloride 0.9 % 100 mL IVPB, 750 mg, Intravenous, Q12H, 07/13/19, MD, Stopped at 05/12/19 2321 .  LORazepam (ATIVAN) injection 2 mg, 2 mg, Intravenous, Q6H, 2 mg at 05/13/19 0433 **FOLLOWED BY** LORazepam (ATIVAN) injection 1 mg, 1 mg, Intravenous, Q6H, Bell, Thomas N, DO .  LORazepam (ATIVAN) injection 1-2 mg, 1-2 mg, Intravenous, Q2H PRN, 07/13/19, DO .  multivitamin with minerals tablet 1 tablet, 1 tablet, Oral, Daily, Pollyann Savoy, DO, 1 tablet at 05/13/19 1054 .  pantoprazole (PROTONIX) EC tablet 40 mg, 40 mg, Oral, Daily, Bell, Thomas N, DO, 40 mg at 05/13/19 1054 Labs CBC    Component Value Date/Time   WBC 13.2 (H) 05/12/2019 0754   RBC 3.96 (L) 05/12/2019 0754   HGB 13.4 05/12/2019 0754   HCT 39.0 05/12/2019 0754  PLT 383 05/12/2019 0754   MCV 98.5 05/12/2019 0754   MCH 33.8 05/12/2019 0754   MCHC 34.4 05/12/2019 0754   RDW 14.2 05/12/2019 0754   LYMPHSABS 1.6 05/01/2019 2210   MONOABS 0.7 05/01/2019 2210   EOSABS 0.0 05/01/2019 2210   BASOSABS 0.0 05/01/2019 2210    CMP     Component Value Date/Time   NA 141 05/12/2019 0754   K 3.2 (L) 05/12/2019 0754   CL 102 05/12/2019 0754   CO2 21 (L) 05/12/2019 0754   GLUCOSE 133 (H) 05/12/2019  0754   BUN 16 05/12/2019 0754   CREATININE 1.22 05/12/2019 0754   CALCIUM 9.5 05/12/2019 0754   PROT 8.6 (H) 05/12/2019 0754   ALBUMIN 4.4 05/12/2019 0754   AST 27 05/12/2019 0754   ALT 17 05/12/2019 0754   ALKPHOS 50 05/12/2019 0754   BILITOT 1.3 (H) 05/12/2019 0754   GFRNONAA >60 05/12/2019 0754   GFRAA >60 05/12/2019 0754    glycosylated hemoglobin  Lipid Panel     Component Value Date/Time   CHOL 218 (H) 02/16/2016 0908   TRIG 167.0 (H) 02/16/2016 0908   HDL 49.10 02/16/2016 0908   CHOLHDL 4 02/16/2016 0908   VLDL 33.4 02/16/2016 0908   LDLCALC 136 (H) 02/16/2016 0908     Imaging I have reviewed images in epic and the results pertinent to this consultation are:  CT-scan of the brain no acute abnormality  MRI examination of the brain cortical edema consistent with PRES  Assessment: This is a72 yo male with Pmhx of HTN and marijuana use who presented 2/28 to the ED for N/V.  While in the ED he developed severe hypertension 200's/110's requiring IV cleviprex and continued to have N/V and abdominal pain and had generalized seizure activity lasting about 1 minute with a prolonged post ictal state.  He was given ativan and Keppra in the ED.   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  Gevena Mart, DNP    NEUROHOSPITALIST ADDENDUM Performed a face to face diagnostic evaluation.   I have reviewed the contents of history and physical exam as documented by PA/ARNP/Resident and agree with above documentation.  I have discussed and formulated  the above plan as documented. Edits to the note have been made as needed.  Patient presented with seizures, noted to have significantly elevated blood pressure- MRI reveals  edema suggestive of PRES. patient had recent food poisoning-likely unable to hold down clonidine and other BP meds due to vomiting, resulting in rebound hypertension. Blood pressure has improved after starting on Cleviprex drip.  Patient is awake and alert, visual fields are intact.  Continue to wean off Cleviprex and restart home medications.  Would watch patient for 24 hours. I would continue Keppra for 14 days due to PRES, however does not need long-term antiepileptic agents as seizure provoked in the setting of cerebral edema from PRES. 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  I suggest that he does not drive for at least 1 month, and have an outpatient neurology follow-up and  needs to be cleared first before he can resume driving.       Karena Addison Lutricia Widjaja MD [facility] [phone]   If 7pm to 7am, please call on call as listed on AMION.

## 2019-05-13 NOTE — Progress Notes (Signed)
Patient belongings inventory  1x sweater  1x sweatpants  1x pair of socks 1 x underwear  1 x pair of sneakers  1 x t shirts Earrings in a specimen cup  Patient has his phone and wallet by the bed side (patient aware that clothes were damp on arrival)

## 2019-05-13 NOTE — Progress Notes (Signed)
Pt transferred to4NICU via bed and monitor accompanied with all pt belongings .Report given to receiving RN ALI  BADI .BP prior to transfer 144/79

## 2019-05-13 NOTE — Progress Notes (Addendum)
NAME:  Anthony Atkinson, MRN:  [id number], DOB:  [birth year], LOS: 1 ADMISSION DATE:  05/12/2019, CONSULTATION DATE:  05/13/19 REFERRING MD: Cheral Marker  , CHIEF COMPLAINT:  PRES   Brief History   49 year old male who presented with nausea, vomiting abdominal pain and had new onset seizure in the ED.  Hypertensive as he was not able to tolerate home oral antihypertensives.  MRI showed press and PCCM consulted for possible IV antihypertensive infusion.  History of present illness   49 year old male with history of hypertension and likely cannabis hyperemesis who presented with abdominal pain, nausea and vomiting.  He says this began after eating a bad meatball sub yesterday and he was not able to hold down his oral clonidine.  Patient has a history of similar episodes of abdominal pain and vomiting, thought possibly secondary to continued marijuana use.  Patient says he is scheduled for a gastric emptying study soon and that this pain felt similar.  He had some loose stools yesterday, but no bowel movement since presentation to the ED.  He also denies fever, chills, chest pain, chest pressure, numbness, tingling, vision changes.  ED, labs showed mild leukocytosis and hypokalemia, normal lipase.  In the ED, pt had approximately 1 minute tonic-clonic seizure followed by post-ictal state. Head CT negative and patient admitted to Hospitalist service.  Pt received 20 IV labetalol and Hydralazine 10mg  IV and  SBP still 160-180.  MRI showed concern for PRES, so PCCM consulted for possible antihypertensive gtt.  On evaluation, patient is awake and appropriate without agitation and is alert and oriented x4.  He denies any current nausea, vomiting, abdominal pain, headache, vision changes or any other complaints.  Past Medical History   has a past medical history of Esophageal perforation, Hidradenitis, and Hypertension.  Significant Hospital Events   2/28>> admit to hospitalist and transferred to  PCCM  Consults:  Neurology, PCCM  Procedures:    Significant Diagnostic Tests:  2/28 CT head>> no acute findings 2/28 MRI brain>>Pattern of cortical edema consistent with posterior reversible encephalopathy. No evidence of true infarction or hemorrhage.  Micro Data:  2/28 respiratory viral panel>> negative  Antimicrobials:    Interim history/subjective:  Off cleviprex since 1130 Patient without complaints Wife states he is still confused, ask repetitive questions Currently extremely diaphoretic - CBG and temp ok   Objective   Blood pressure 129/83, pulse 82, temperature 98.2 F (36.8 C), temperature source Oral, resp. rate (!) 27, SpO2 96 %.        Intake/Output Summary (Last 24 hours) at 05/13/2019 1457 Last data filed at 05/13/2019 1200 Gross per 24 hour  Intake 1977.92 ml  Output 799 ml  Net 1178.92 ml   There were no vitals filed for this visit.  General:  Adult male lying in bed, extremely diaphoretic  HEENT: MM pink/moist, pupils 4/reactive Neuro: Alert to person/ place/ birthday (today), not to event, MAE, no appreciated weakness CV: rr, no murmur PULM:  Non labored, CTA GI: soft, NT/ND, +bs Extremities: warm/dry, no edema  Skin: no rashes  Resolved Hospital Problem list   Postictal state  Assessment & Plan:   New onset seizure activity likely secondary to uncontrolled hypertension and PRES  P: Strict SBP goal < 140 Continue to monitor overnight in ICU per Neuro recs Resumed clonidine, norvasc  May need to add home apresoline Seizure precautions Continue Keppra Pending EEG Serial neuro exams   Abdominal pain, nausea and vomiting -Question of cannabis hyperemesis/cyclical vomiting syndrome -Patient reports outpatient GI  follow-up for gastric emptying study P:  Resolved, monitor   Possible alcohol withdrawal - wife tells me its withdrawal from [facility], rarely drinks ETOH, but there is some question over ETOH use  P: -Continue CIWA - adding  thiamine/ folate    Prolonged QTC P: Since improved, tele monitor Recheck mag in am for goal > 2  Best practice:  Diet: heart healthy Pain/Anxiety/Delirium protocol (if indicated): N/A VAP protocol (if indicated): N/A DVT prophylaxis: Lovenox GI prophylaxis: N/A Glucose control: N/A Mobility: As tolerated Code Status: Full code Family Communication: Patient and wife updated at bedside Disposition: ICU overnight, will sign out to [facility] for pickup 3/2  Labs   CBC: Recent Labs  Lab 05/12/19 0754  WBC 13.2*  HGB 13.4  HCT 39.0  MCV 98.5  PLT 383    Basic Metabolic Panel: Recent Labs  Lab 05/12/19 0754 05/12/19 0901  NA 141  --   K 3.2*  --   CL 102  --   CO2 21*  --   GLUCOSE 133*  --   BUN 16  --   CREATININE 1.22  --   CALCIUM 9.5  --   MG  --  1.7   GFR: Estimated Creatinine Clearance: 81.9 mL/min (by C-G formula based on SCr of 1.22 mg/dL). Recent Labs  Lab 05/12/19 0754  WBC 13.2*    Liver Function Tests: Recent Labs  Lab 05/12/19 0754  AST 27  ALT 17  ALKPHOS 50  BILITOT 1.3*  PROT 8.6*  ALBUMIN 4.4   Recent Labs  Lab 05/12/19 0754  LIPASE 22   No results for input(s): AMMONIA in the last 168 hours.  ABG    Component Value Date/Time   HCO3 26.4 05/01/2019 2210   TCO2 20.8 04/23/2015 1615   O2SAT 99.4 05/01/2019 2210     Coagulation Profile: No results for input(s): INR, PROTIME in the last 168 hours.  Cardiac Enzymes: No results for input(s): CKTOTAL, CKMB, CKMBINDEX, TROPONINI in the last 168 hours.  HbA1C: Hgb A1c MFr Bld  Date/Time Value Ref Range Status  03/18/2019 10:44 AM 6.1 4.6 - 6.5 % Final    Comment:    Glycemic Control Guidelines for People with Diabetes:Non Diabetic:  <6%Goal of Therapy: <7%Additional Action Suggested:  >8%     CBG: Recent Labs  Lab 05/12/19 0820 05/13/19 0624 05/13/19 1218  GLUCAP 157* 99 99     Posey Boyer, MSN, AGACNP-BC [facility] 05/13/2019, 2:57  PM

## 2019-05-14 DIAGNOSIS — R569 Unspecified convulsions: Secondary | ICD-10-CM

## 2019-05-14 LAB — BASIC METABOLIC PANEL
Anion gap: 10 (ref 5–15)
BUN: 12 mg/dL (ref 6–20)
CO2: 25 mmol/L (ref 22–32)
Calcium: 8.4 mg/dL — ABNORMAL LOW (ref 8.9–10.3)
Chloride: 103 mmol/L (ref 98–111)
Creatinine, Ser: 0.98 mg/dL (ref 0.61–1.24)
GFR calc Af Amer: >60 mL/min (ref >60)
GFR calc non Af Amer: >60 mL/min (ref >60)
Glucose, Bld: 85 mg/dL (ref 70–99)
Potassium: 3.2 mmol/L — ABNORMAL LOW (ref 3.5–5.1)
Sodium: 138 mmol/L (ref 135–145)

## 2019-05-14 LAB — GLUCOSE, CAPILLARY: Glucose-Capillary: 135 mg/dL — ABNORMAL HIGH (ref 70–99)

## 2019-05-14 LAB — TRIGLYCERIDES: Triglycerides: 148 mg/dL (ref <150)

## 2019-05-14 MED ORDER — LORAZEPAM 1 MG PO TABS: 1.0000 mg | ORAL_TABLET | ORAL | Status: DC | PRN

## 2019-05-14 MED ORDER — LISINOPRIL 10 MG PO TABS
10.0000 mg | ORAL_TABLET | Freq: Every day | ORAL | Status: DC
  Administered 2019-05-14 – 2019-05-15 (×2): 10 mg via ORAL
  Filled 2019-05-14: qty 1

## 2019-05-14 MED ORDER — LEVETIRACETAM 750 MG PO TABS
750.0000 mg | ORAL_TABLET | Freq: Two times a day (BID) | ORAL | Status: DC
  Administered 2019-05-14 – 2019-05-15 (×2): 750 mg via ORAL
  Filled 2019-05-14 (×2): qty 1

## 2019-05-14 NOTE — Plan of Care (Signed)
  Problem: Education: Goal: Expressions of having a comfortable level of knowledge regarding the disease process will increase Outcome: Progressing   

## 2019-05-14 NOTE — Progress Notes (Addendum)
PROGRESS NOTE    Anthony Atkinson  UEA:[id number] DOB: 1970/06/04 DOA: 05/12/2019 PCP: Myrlene Broker, MD    Brief Narrative:  Patient was admitted to the hospital with working diagnosis of new onset generalized seizures due to PRES,/ hypertensive emergency and possible alcohol withdrawal syndrome.  49 year old male who presented with nausea and vomiting.  He does have significant past medical history for hypertension and marijuana use.  Apparently he sustained a mechanical fall 2 weeks prior to hospitalization.  He reported about 12 hours of nausea, vomiting and loose stools.  While at the emergency department patient developed a generalized seizure activity for approximately 1 minute with a postictal lethargy.  Patient received lorazepam and Keppra.  On his initial physical examination blood pressure 162/108, heart rate 95, respiratory rate 35, oxygen saturation 97%.  His lungs are clear to auscultation bilaterally, heart S1-S2 present rhythmic, soft abdomen, no lower extremity edema, patient was agitated, disoriented, postictal. Sodium 141, potassium 3.2, chloride 102, bicarb 21, glucose 133, BUN 16, creatinine 1.2.  White count 13.2, hemoglobin 13.4, hematocrit 39.0, platelets 383.  SARS COVID-19 negative.  Urinalysis specific gravity 1.011, 100 protein, 0-5 red cells, 0-5 white cells.  Head CT with no acute changes. Chest radiograph with no acute changes.  EKG 109 bpm, normal axis, normal intervals, sinus rhythm, no significant ST segment or T wave changes.  And underwent further work-up, brain MRI showed PRES, she was transferred to the intensive care unit for tight blood pressure control and monitor alcohol withdrawal symptoms.   Patient was placed clevidipine drip with good toleration, started on amlodipine and resumed clonidine. Seizure control with Keppra.   03/02. Transfer to medical telemetry.   Assessment & Plan:   Active Problems:   Malignant hypertension   Nausea &  vomiting   Essential hypertension   Cyclic vomiting syndrome   Marijuana use   Seizure (HCC)   PRES (posterior reversible encephalopathy syndrome)   1. PRES syndrome/ hypertensive emergency, complicated with new onset seizures. Blood pressure this am 144/91 mmHg, no headache or further seizures.  Patient at home was on 3 antihypertensive medications and developed hypotension with falls, he was transition by his primary care to monotherapy with clonidine, that he was note able to take due to nausea and vomiting.  Patient now off clevidipine, on clonidine and amlodipine.   Will plan to dc clonidine due to risk of rebound hypertension with non compliance. Will add lisinopril 10 mg, patient has CKD. Follow on renal function and electrolytes.   2. Seizures related to PRES syndrome. Will continue Keppra per neurology recommendations. Change to po, continue neuro checks, will request PT and OT evaluations. Out of bed to chair tid with meals.   3. AKI on CKD stage 3a/ hypokalemia/ anion gap metabolic acidosis. Base Cr at 1,6, check renal panel today, on 02/28 serum cr was down to 1,22 and K was 3,2, bicarb 21.   3. Alcohol use and Tetrahydrocannabinol related vomiting. No further vomiting,  Will continue as needed antiemetics. Will continue as needed lorazepam for anxiety, no clinical signs of acute withdrawal at this point in time.   DVT prophylaxis: enoxaparin   Code Status: full Family Communication: I spoke with patient's wife at the bedside, we talked in detail about patient's condition, plan of care and prognosis and all questions were addressed.  Disposition Plan/ discharge barriers: Transfer to medical telemetry. Patient from home barrier for discharge uncontrolled HTN and seizures, continue to require inpatient care.    Consultants:  Neurology   PCCM   Subjective: Patient is feeling better, no headache, no nausea or vomiting, no chest pain or dyspnea. At home not able to take  clonidine due to nausea or vomiting.   Objective: Vitals:   05/14/19 0400 05/14/19 0700 05/14/19 0800 05/14/19 0900  BP: 93/72 128/86 119/70 (!) 144/91  Pulse:   (!) 59 68  Resp: (!) 25 14 (!) 22 (!) 32  Temp: 98.5 F (36.9 C)  98.6 F (37 C)   TempSrc: Oral  Oral   SpO2:   97% 99%    Intake/Output Summary (Last 24 hours) at 05/14/2019 1019 Last data filed at 05/14/2019 0900 Gross per 24 hour  Intake 3144.63 ml  Output 375 ml  Net 2769.63 ml   There were no vitals filed for this visit.  Examination:   General: Not in pain or dyspnea, deconditioned  Neurology: Awake and alert, non focal  E ENT: mild pallor, no icterus, oral mucosa moist Cardiovascular: No JVD. S1-S2 present, rhythmic, no gallops, rubs, or murmurs. No lower extremity edema. Pulmonary: positive breath sounds bilaterally, adequate air movement, no wheezing, rhonchi or rales. Gastrointestinal. Abdomen with no organomegaly, non tender, no rebound or guarding Skin. No rashes Musculoskeletal: no joint deformities     Data Reviewed: I have personally reviewed following labs and imaging studies  CBC: Recent Labs  Lab 05/12/19 0754  WBC 13.2*  HGB 13.4  HCT 39.0  MCV 98.5  PLT 660   Basic Metabolic Panel: Recent Labs  Lab 05/12/19 0754 05/12/19 0901  NA 141  --   K 3.2*  --   CL 102  --   CO2 21*  --   GLUCOSE 133*  --   BUN 16  --   CREATININE 1.22  --   CALCIUM 9.5  --   MG  --  1.7   GFR: Estimated Creatinine Clearance: 81.9 mL/min (by C-G formula based on SCr of 1.22 mg/dL). Liver Function Tests: Recent Labs  Lab 05/12/19 0754  AST 27  ALT 17  ALKPHOS 50  BILITOT 1.3*  PROT 8.6*  ALBUMIN 4.4   Recent Labs  Lab 05/12/19 0754  LIPASE 22   No results for input(s): AMMONIA in the last 168 hours. Coagulation Profile: No results for input(s): INR, PROTIME in the last 168 hours. Cardiac Enzymes: No results for input(s): CKTOTAL, CKMB, CKMBINDEX, TROPONINI in the last 168  hours. BNP (last 3 results) No results for input(s): PROBNP in the last 8760 hours. HbA1C: No results for input(s): HGBA1C in the last 72 hours. CBG: Recent Labs  Lab 05/13/19 0624 05/13/19 1218 05/13/19 1514 05/13/19 1752 05/13/19 2338  GLUCAP 99 99 111* 104* 92   Lipid Profile: Recent Labs    05/14/19 0621  TRIG 148   Thyroid Function Tests: No results for input(s): TSH, T4TOTAL, FREET4, T3FREE, THYROIDAB in the last 72 hours. Anemia Panel: No results for input(s): VITAMINB12, FOLATE, FERRITIN, TIBC, IRON, RETICCTPCT in the last 72 hours.    Radiology Studies: I have reviewed all of the imaging during this hospital visit personally     Scheduled Meds: . amLODipine  10 mg Oral Daily  . chlorhexidine  15 mL Mouth Rinse BID  . Chlorhexidine Gluconate Cloth  6 each Topical Daily  . cloNIDine  0.3 mg Transdermal Weekly  . enoxaparin (LOVENOX) injection  40 mg Subcutaneous Q24H  . folic acid  1 mg Oral Daily  . LORazepam  1 mg Intravenous Q6H  . multivitamin with minerals  1 tablet Oral Daily  . pantoprazole  40 mg Oral Daily  . thiamine  100 mg Oral Daily   Or  . thiamine  100 mg Intravenous Daily   Continuous Infusions: . clevidipine Stopped (05/13/19 1131)  . lactated ringers 125 mL/hr at 05/14/19 0357  . levETIRAcetam Stopped (05/14/19 0645)     LOS: 2 days        Duc Crocket Annett Gula, MD

## 2019-05-14 NOTE — Progress Notes (Addendum)
Reason for consult: Seizures  Subjective: Patient is off Cleviprex drip.  Blood pressure in normal range on oral medications   ROS: negative except above  Examination  Vital signs in last 24 hours: Temp:  [98.2 F (36.8 C)-99.1 F (37.3 C)] 98.6 F (37 C) (03/02 0800) Pulse Rate:  [59-108] 68 (03/02 0900) Resp:  [14-37] 32 (03/02 0900) BP: (93-144)/(68-97) 144/91 (03/02 0900) SpO2:  [94 %-100 %] 99 % (03/02 0900)  General: lying in bed CVS: pulse-normal rate and rhythm RS: breathing comfortably Extremities: normal   Neuro: MS: Alert, oriented, follows commands CN: pupils equal and reactive,  EOMI, face symmetric, tongue midline, normal sensation over face, Motor: 5/5 strength in all 4 extremities , plantars: flexor Coordination: normal Gait: not tested  Basic Metabolic Panel: Recent Labs  Lab 05/12/19 0754 05/12/19 0901  NA 141  --   K 3.2*  --   CL 102  --   CO2 21*  --   GLUCOSE 133*  --   BUN 16  --   CREATININE 1.22  --   CALCIUM 9.5  --   MG  --  1.7    CBC: Recent Labs  Lab 05/12/19 0754  WBC 13.2*  HGB 13.4  HCT 39.0  MCV 98.5  PLT 383     Coagulation Studies: No results for input(s): LABPROT, INR in the last 72 hours.  Imaging Reviewed:     ASSESSMENT AND PLAN  Posterior reversible encephalopathy syndrome/hypertensive emergency Seizures provoked by PRES Viral Gastroenteritis vs hyperemesis from cannabis  - SBP goal < 140  - Continue Keppra for 14 days  --Seizure precautions - No driving at least for 1 month, needs clearance by neurology before he can resume driving  Outpatient neurology follow-up in 1 month    Marquese Burkland [facility] Pager Number [phone] For questions after 7pm please refer to AMION to reach the Neurologist on call

## 2019-05-14 NOTE — Evaluation (Signed)
Physical Therapy Evaluation Patient Details Name: Anthony Atkinson MRN: [id number] DOB: April 06, 1970 Today's Date: 05/14/2019   History of Present Illness  Pt is 49 yo male who presented with nausea and vomitting.  He was admitted with new onset generalized seizures due to PRES/hypertensive emergency and possible alcohol withdrawal syndrome.   Pt was in ICU for tight blood pressure control but now with orders to transfer to medical telemetry.  Clinical Impression  Pt admitted with above diagnosis. Pt progressing well and is near baseline.  Demonstrated excellent strength.  Only had mild deficits with higher level balance activities.  Pt normally very active and works active job - will benefit from acute PT to assist in returning to baseline.  Pt currently with functional limitations due to the deficits listed below (see PT Problem List). Pt will benefit from skilled PT to increase their independence and safety with mobility to allow discharge to the venue listed below.       Follow Up Recommendations No PT follow up(discussed if balance not back to baseline could pursue outpt PT if desired)    Equipment Recommendations  None recommended by PT    Recommendations for Other Services       Precautions / Restrictions Precautions Precautions: None      Mobility  Bed Mobility Overal bed mobility: Independent                Transfers Overall transfer level: Needs assistance Equipment used: None Transfers: Sit to/from Stand Sit to Stand: Supervision            Ambulation/Gait Ambulation/Gait assistance: Min guard;Supervision Gait Distance (Feet): 500 Feet Assistive device: None       General Gait Details: min guard progressed to supervision;  normal gait pattern and speed other than mild instabilities with DGI (see below)  Pt staggering/drifting with head turns but recovers  Stairs Stairs: Yes Stairs assistance: Min guard Stair Management: No rails;Alternating  pattern;Forwards Number of Stairs: 12 General stair comments: mild instability - educated to use rail at home  Wheelchair Mobility    Modified Rankin (Stroke Patients Only)       Balance Overall balance assessment: Needs assistance Sitting-balance support: No upper extremity supported;Feet supported Sitting balance-Leahy Scale: Normal     Standing balance support: No upper extremity supported;During functional activity Standing balance-Leahy Scale: Good                   Standardized Balance Assessment Standardized Balance Assessment : Dynamic Gait Index   Dynamic Gait Index Level Surface: Normal Change in Gait Speed: Normal Gait with Horizontal Head Turns: Mild Impairment Gait with Vertical Head Turns: Mild Impairment Gait and Pivot Turn: Normal Step Over Obstacle: Normal Step Around Obstacles: Normal Steps: Normal Total Score: 22       Pertinent Vitals/Pain Pain Assessment: No/denies pain    Home Living Family/patient expects to be discharged to:: Private residence Living Arrangements: Spouse/significant other Available Help at Discharge: Family;Available PRN/intermittently Type of Home: House Home Access: Level entry     Home Layout: Multi-level Home Equipment: None      Prior Function Level of Independence: Independent         Comments: Very independent; works as Geophysicist/field seismologist for [employer]        Extremity/Trunk Assessment   Upper Extremity Assessment Upper Extremity Assessment: (Bil UE ROM WFL and MMT 5/5) RUE Sensation: WNL RUE Coordination: WNL    Lower Extremity Assessment Lower Extremity Assessment: (Bil  LE ROM WFL and MMT 5/5) RLE Sensation: WNL RLE Coordination: WNL LLE Sensation: WNL LLE Coordination: WNL    Cervical / Trunk Assessment Cervical / Trunk Assessment: Normal  Communication   Communication: No difficulties  Cognition Arousal/Alertness: Awake/alert Behavior During Therapy: WFL for tasks  assessed/performed Overall Cognitive Status: Within Functional Limits for tasks assessed                                        General Comments General comments (skin integrity, edema, etc.): All VSS in all positions    Exercises     Assessment/Plan    PT Assessment Patient needs continued PT services  PT Problem List Decreased balance;Decreased safety awareness       PT Treatment Interventions Gait training;Balance training;Stair training;Neuromuscular re-education;Patient/family education    PT Goals (Current goals can be found in the Care Plan section)  Acute Rehab PT Goals Patient Stated Goal: return home PT Goal Formulation: With patient Time For Goal Achievement: 05/28/19 Potential to Achieve Goals: Good    Frequency Min 3X/week   Barriers to discharge        Co-evaluation               AM-PAC PT "6 Clicks" Mobility  Outcome Measure Help needed turning from your back to your side while in a flat bed without using bedrails?: None Help needed moving from lying on your back to sitting on the side of a flat bed without using bedrails?: None Help needed moving to and from a bed to a chair (including a wheelchair)?: None Help needed standing up from a chair using your arms (e.g., wheelchair or bedside chair)?: None Help needed to walk in hospital room?: None Help needed climbing 3-5 steps with a railing? : None 6 Click Score: 24    End of Session   Activity Tolerance: Patient tolerated treatment well Patient left: in bed;with call bell/phone within reach;with family/visitor present Nurse Communication: Mobility status PT Visit Diagnosis: Unsteadiness on feet (R26.81)    Time: 8469-6295 PT Time Calculation (min) (ACUTE ONLY): 23 min   Charges:   PT Evaluation $PT Eval Low Complexity: 1 Low          Royetta Asal, PT Acute Rehab Services Pager [phone] [facility] [phone] [facility] [phone]   Rayetta Humphrey 05/14/2019, 2:52 PM

## 2019-05-15 DIAGNOSIS — R1115 Cyclical vomiting syndrome unrelated to migraine: Secondary | ICD-10-CM

## 2019-05-15 DIAGNOSIS — F1290 Cannabis use, unspecified, uncomplicated: Secondary | ICD-10-CM

## 2019-05-15 LAB — MAGNESIUM: Magnesium: 1.7 mg/dL (ref 1.7–2.4)

## 2019-05-15 LAB — BASIC METABOLIC PANEL
Anion gap: 8 (ref 5–15)
Anion gap: 4 — ABNORMAL LOW (ref 5–15)
BUN: 8 mg/dL (ref 6–20)
BUN: 9 mg/dL (ref 6–20)
CO2: 27 mmol/L (ref 22–32)
CO2: 29 mmol/L (ref 22–32)
Calcium: 8.2 mg/dL — ABNORMAL LOW (ref 8.9–10.3)
Calcium: 8.5 mg/dL — ABNORMAL LOW (ref 8.9–10.3)
Chloride: 102 mmol/L (ref 98–111)
Chloride: 107 mmol/L (ref 98–111)
Creatinine, Ser: 1.02 mg/dL (ref 0.61–1.24)
Creatinine, Ser: 1.08 mg/dL (ref 0.61–1.24)
GFR calc Af Amer: >60 mL/min (ref >60)
GFR calc Af Amer: >60 mL/min (ref >60)
GFR calc non Af Amer: >60 mL/min (ref >60)
GFR calc non Af Amer: >60 mL/min (ref >60)
Glucose, Bld: 118 mg/dL — ABNORMAL HIGH (ref 70–99)
Glucose, Bld: 117 mg/dL — ABNORMAL HIGH (ref 70–99)
Potassium: 2.7 mmol/L — CL (ref 3.5–5.1)
Potassium: 4.3 mmol/L (ref 3.5–5.1)
Sodium: 137 mmol/L (ref 135–145)
Sodium: 140 mmol/L (ref 135–145)

## 2019-05-15 MED ORDER — HYDRALAZINE HCL 25 MG PO TABS
25.0000 mg | ORAL_TABLET | Freq: Two times a day (BID) | ORAL | Status: DC
  Filled 2019-05-15: qty 1

## 2019-05-15 MED ORDER — POTASSIUM CHLORIDE CRYS ER 20 MEQ PO TBCR
40.0000 meq | EXTENDED_RELEASE_TABLET | ORAL | Status: AC
  Administered 2019-05-15 (×2): 40 meq via ORAL
  Filled 2019-05-15 (×2): qty 2

## 2019-05-15 MED ORDER — POTASSIUM CHLORIDE CRYS ER 20 MEQ PO TBCR
40.0000 meq | EXTENDED_RELEASE_TABLET | Freq: Two times a day (BID) | ORAL | Status: DC
  Administered 2019-05-15: 40 meq via ORAL
  Filled 2019-05-15: qty 2

## 2019-05-15 MED ORDER — LEVETIRACETAM 750 MG PO TABS: 750.0000 mg | ORAL_TABLET | Freq: Two times a day (BID) | ORAL | 0 refills | Status: DC

## 2019-05-15 MED ORDER — MAGNESIUM SULFATE IN D5W 1-5 GM/100ML-% IV SOLN
1.0000 g | Freq: Once | INTRAVENOUS | Status: AC
  Administered 2019-05-15: 1 g via INTRAVENOUS
  Filled 2019-05-15: qty 100

## 2019-05-15 MED ORDER — CLONIDINE HCL 0.2 MG PO TABS
0.3000 mg | ORAL_TABLET | Freq: Two times a day (BID) | ORAL | Status: DC
  Administered 2019-05-15: 0.3 mg via ORAL
  Filled 2019-05-15: qty 1

## 2019-05-15 MED ORDER — POTASSIUM CHLORIDE CRYS ER 20 MEQ PO TBCR
40.0000 meq | EXTENDED_RELEASE_TABLET | Freq: Once | ORAL | Status: AC
  Administered 2019-05-15: 40 meq via ORAL
  Filled 2019-05-15: qty 2

## 2019-05-15 NOTE — Progress Notes (Signed)
Physical Therapy Treatment Patient Details Name: Anthony Atkinson MRN: [id number] DOB: 1971-02-04 Today's Date: 05/15/2019    History of Present Illness Pt is 49 yo male who presented with nausea and vomitting.  He was admitted with new onset generalized seizures due to PRES/hypertensive emergency and possible alcohol withdrawal syndrome.   Pt was in ICU for tight blood pressure control but now with orders to transfer to medical telemetry.    PT Comments    Pt anxious and eager to return home. No significant deficits noted throughout session and pt reporting that he feels he is at his baseline in regards to functional mobility. Plan is to d/c home today with wife's support.   Follow Up Recommendations  No PT follow up     Equipment Recommendations  None recommended by PT    Recommendations for Other Services       Precautions / Restrictions Precautions Precautions: Other (comment) Precaution Comments: Seizure precautions Restrictions Weight Bearing Restrictions: No    Mobility  Bed Mobility Overal bed mobility: Independent             General bed mobility comments: pt OOB in recliner chair upon arrival  Transfers Overall transfer level: Independent Equipment used: None Transfers: Sit to/from Stand Sit to Stand: Independent         General transfer comment: Pt able to ambulate around room independently. Pt completed toilet transfer and simulated tub/shower transfer independently. Noted 0 instances of LOB.   Ambulation/Gait Ambulation/Gait assistance: Supervision Gait Distance (Feet): 400 Feet Assistive device: None Gait Pattern/deviations: Step-through pattern;Decreased stride length;Drifts right/left Gait velocity: able to fluctuate   General Gait Details: no instability or LOB, no need for an AD or physical assistance   Stairs             Wheelchair Mobility    Modified Rankin (Stroke Patients Only)       Balance Overall balance  assessment: Independent               Single Leg Stance - Right Leg: 3(seconds) Single Leg Stance - Left Leg: 7(seconds)         High level balance activites: Side stepping;Backward walking;Direction changes;Turns;Sudden stops;Head turns High Level Balance Comments: supervision for all tasks, no difficulties noted            Cognition Arousal/Alertness: Awake/alert Behavior During Therapy: WFL for tasks assessed/performed Overall Cognitive Status: Within Functional Limits for tasks assessed                                        Exercises      General Comments General comments (skin integrity, edema, etc.): VSS on RA      Pertinent Vitals/Pain Pain Assessment: No/denies pain    Home Living Family/patient expects to be discharged to:: Private residence Living Arrangements: Spouse/significant other Available Help at Discharge: Family;Available PRN/intermittently Type of Home: House Home Access: Level entry   Home Layout: Multi-level Home Equipment: None      Prior Function Level of Independence: Independent      Comments: Pt independent in ADLs, IADLs, and mobility. Pt does not ambulate with an assistive device. Pt still works and drives. 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[employer]   PT Goals (current goals can now be found in the care plan section) Acute Rehab PT Goals PT Goal Formulation: With patient Time For Goal Achievement: 05/28/19 Potential to Achieve Goals: Good  Progress towards PT goals: Progressing toward goals    Frequency    Min 3X/week      PT Plan Current plan remains appropriate    Co-evaluation              AM-PAC PT "6 Clicks" Mobility   Outcome Measure  Help needed turning from your back to your side while in a flat bed without using bedrails?: None Help needed moving from lying on your back to sitting on the side of a flat bed without using bedrails?: None Help needed moving to and from a bed to a chair (including a  wheelchair)?: None Help needed standing up from a chair using your arms (e.g., wheelchair or bedside chair)?: None Help needed to walk in hospital room?: None Help needed climbing 3-5 steps with a railing? : None 6 Click Score: 24    End of Session   Activity Tolerance: Patient tolerated treatment well Patient left: in chair;with call bell/phone within reach Nurse Communication: Mobility status PT Visit Diagnosis: Unsteadiness on feet (R26.81)     Time: 7737-3668 PT Time Calculation (min) (ACUTE ONLY): 9 min  Charges:  $Gait Training: 8-22 mins                     Arletta Bale, DPT  Acute Rehabilitation Services Pager [phone] Office [phone]     Anthony Atkinson 05/15/2019, 9:34 AM

## 2019-05-15 NOTE — Progress Notes (Signed)
Notified Craige Cotta NP of lab result K+ 2.7. New order received.

## 2019-05-15 NOTE — Discharge Summary (Signed)
Discharge Summary  Anthony Atkinson YNW:[id number] DOB: 11-30-1970  PCP: Myrlene Broker, MD  Admit date: 05/12/2019 Discharge date: 05/15/2019  Time spent: 40 mins  Recommendations for Outpatient Follow-up:  1. PCP in 1 week 2. Neurology in 4 weeks  Discharge Diagnoses:  Active Hospital Problems   Diagnosis Date Noted  . PRES (posterior reversible encephalopathy syndrome)   . Seizure (HCC) 05/12/2019  . Marijuana use 12/26/2017  . Cyclic vomiting syndrome   . Essential hypertension   . Nausea & vomiting   . Malignant hypertension 02/13/2015    Resolved Hospital Problems  No resolved problems to display.    Discharge Condition: Stable   Diet recommendation: Heart healthy  Vitals:   05/15/19 1100 05/15/19 1526  BP: 120/90 118/84  Pulse:  61  Resp:  20  Temp:  98.2 F (36.8 C)  SpO2:  100%    History of present illness:  49 year old male who presented with nausea and vomiting.  He does have significant past medical history for hypertension and marijuana use.  Apparently he sustained a mechanical fall 2 weeks prior to hospitalization.  He reported about 12 hours of nausea, vomiting and loose stools.  While at the emergency department patient developed a generalized seizure activity for approximately 1 minute with a postictal lethargy.  Patient received lorazepam and Keppra.  On his initial physical examination blood pressure 162/108, heart rate 95, respiratory rate 35, oxygen saturation 97%.  His lungs are clear to auscultation bilaterally, heart S1-S2 present rhythmic, soft abdomen, no lower extremity edema, patient was agitated, disoriented, postictal. Sodium 141, potassium 3.2, chloride 102, bicarb 21, glucose 133, BUN 16, creatinine 1.2.  White count 13.2, hemoglobin 13.4, hematocrit 39.0, platelets 383.  SARS COVID-19 negative.  Urinalysis specific gravity 1.011, 100 protein, 0-5 red cells, 0-5 white cells.  Head CT with no acute changes. Chest radiograph with no  acute changes. Brain MRI showed PRES, she was transferred to the intensive care unit for tight blood pressure control and monitor alcohol withdrawal symptoms. Patient was placed on  clevidipine drip with good toleration, started on amlodipine and resumed clonidine. Seizure control with Keppra. 03/02 pt transfered to medical telemetry.   Today, patient reports feeling much better, denies any nausea/vomiting, was able to tolerate his diet.  No further seizures noted.  Denies any headache, chest pain, shortness of breath, abdominal pain, fever/chills.  Patient very eager to be discharged.   Hospital Course:  Active Problems:   Malignant hypertension   Nausea & vomiting   Essential hypertension   Cyclic vomiting syndrome   Marijuana use   Seizure (HCC)   PRES (posterior reversible encephalopathy syndrome)   PRES syndrome/ hypertensive emergency, complicated with new onset seizures BP stable Mentation back to baseline BP medication regimen was changed while inpatient, but patient not eager to take lisinopril because he was told that he could affect his kidneys. Patient insisting on staying on his clonidine as well as hydralazine of which he takes if his blood pressure gets elevated. 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   Weight as of 05/08/19: 86.6 kg.    Procedures:  None  Consultations:  PCCM  Neurology  Discharge Exam: BP 118/84 (BP Location: Right Arm)   Pulse 61   Temp 98.2 F (36.8 C) (Oral)   Resp 20   SpO2 100%   General: NAD Cardiovascular: S1, S2 present Respiratory: CTA B  Discharge Instructions You were cared for by a hospitalist during your hospital stay. If you have any questions about your discharge medications or the care you received while you were in the hospital after you are discharged, you can call the unit and asked to speak with the hospitalist on call if the hospitalist that took care of you is not available. Once you are discharged, your primary care physician will handle any further medical issues. Please note that NO REFILLS for any discharge medications will be authorized once you are discharged, as it is imperative that you return to your primary care physician (or establish a relationship with a primary care physician if you do not have one) for your aftercare needs so that they can reassess your need for medications and monitor your lab values.  Discharge Instructions    Diet - low sodium heart healthy   Complete by: As directed    Increase activity slowly   Complete by: As directed      Allergies as of 05/15/2019   No Known Allergies     Medication List    TAKE these medications   cloNIDine 0.3 MG tablet Commonly known as: CATAPRES Take 1 tablet (0.3 mg total) by mouth 2 (two) times daily.   hydrALAZINE 25 MG tablet Commonly known as: APRESOLINE Take 1 tablet (25 mg total) by mouth 2 (two) times daily.   levETIRAcetam 750 MG tablet Commonly known as: KEPPRA Take 1  tablet (750 mg total) by mouth 2 (two) times daily for 13 days.   omeprazole 20 MG capsule Commonly known as: PRILOSEC Take 1 capsule (20 mg total) by mouth daily.   ondansetron 4 MG tablet Commonly known as: Zofran Take 1 tablet (4 mg total) by mouth every 8 (eight) hours as needed for nausea or vomiting.   sildenafil 100 MG tablet Commonly known as: VIAGRA TAKE ONE-HALF TO ONE TABLET BY MOUTH DAILY AS NEEDED  FOR ERECTILE DYSFUNCTION. What changed: See the new instructions.   tretinoin 0.05 % cream Commonly known as: RETIN-A Apply 1 application topically 2 (two) times daily as needed (wound healing).      No Known Allergies Follow-up Information    Hoyt Koch, MD. Schedule an appointment as soon as possible for a visit in 1 week(s).   Specialty: Internal Medicine Contact information: [place] [address] [address]. Schedule an appointment as soon as possible for a visit in 4 week(s).   Contact information: [address]     [place] [address] [phone]  The results of significant diagnostics from this hospitalization (including imaging, microbiology, ancillary and laboratory) are listed below for reference.    Significant Diagnostic Studies: CT HEAD WO CONTRAST  Result Date: 05/12/2019 CLINICAL DATA:  Encephalopathy EXAM: CT HEAD WITHOUT CONTRAST TECHNIQUE: Contiguous axial images were obtained from the base of the skull through the vertex without intravenous contrast. COMPARISON:  None. FINDINGS: Brain: No evidence of acute infarction, hemorrhage, hydrocephalus, extra-axial collection or mass lesion/mass effect. Vascular: No hyperdense vessel or unexpected calcification. Skull: Normal. Negative for fracture or focal lesion. Sinuses/Orbits: No acute finding. Other: None. IMPRESSION: No acute intracranial pathology. Electronically Signed   By: Lauralyn Primes M.D.   On: 05/12/2019 09:16   MR BRAIN WO CONTRAST  Result Date: 05/12/2019 CLINICAL DATA:  Seizure like activity. Mental status changes. Combative. Cephalopathy. EXAM: MRI HEAD WITHOUT CONTRAST TECHNIQUE: Multiplanar, multiecho pulse sequences of the brain and surrounding structures were obtained without intravenous contrast. COMPARISON:  Head CT same day FINDINGS: Brain: Study suffers from some motion degradation. Rapid sequences were performed when possible. No abnormal signal affects the brainstem. Few small areas of slightly increased T2 and FLAIR signal within the peripheral cerebellum. Abnormal cortical edema in the parieto-occipital cortical brain bilaterally, extending into the frontal vertex regions, typical of posterior reversible encephalopathy. No evidence of hemorrhage. No vascular territory distribution insult. No mass, hydrocephalus or extra-axial collection. Evidence of restricted diffusion. No mesial temporal lesion is seen. Vascular: Major vessels at the base of the brain show flow. Skull and upper cervical spine: Negative Sinuses/Orbits: Clear/normal Other: None IMPRESSION: Pattern of cortical edema consistent with posterior reversible encephalopathy. No evidence of true infarction or hemorrhage. Electronically Signed   By: Paulina Fusi M.D.   On: 05/12/2019 19:58   DG Chest Port 1 View  Result Date: 05/12/2019 CLINICAL DATA:  Seizure, hypotension EXAM: PORTABLE CHEST 1 VIEW COMPARISON:  04/30/2019 FINDINGS: Mild cardiomegaly. Both lungs are clear. The visualized skeletal structures are unremarkable. IMPRESSION: Mild cardiomegaly without acute abnormality of the lungs in AP portable projection. Electronically Signed   By: Lauralyn Primes M.D.   On: 05/12/2019 14:00   DG Chest Port 1 View  Result Date: 04/30/2019 CLINICAL DATA:  Vomiting. EXAM: PORTABLE CHEST 1 VIEW COMPARISON:  12/15/2015 FINDINGS: The heart size and mediastinal contours are within normal limits. Both lungs are  clear. The visualized skeletal structures are unremarkable. IMPRESSION: No active disease. Electronically Signed   By: Katherine Mantle M.D.   On: 04/30/2019 20:24    Microbiology: Recent Results (from the past 240 hour(s))  Respiratory Panel by RT PCR (Flu A&B, Covid) - Nasopharyngeal Swab     Status: None   Collection Time: 05/12/19 11:36 AM   Specimen: Nasopharyngeal Swab  Result Value Ref Range Status   SARS Coronavirus 2 by RT PCR NEGATIVE NEGATIVE Final    Comment: (NOTE) SARS-CoV-2 target nucleic acids are NOT DETECTED. The SARS-CoV-2 RNA is generally detectable in upper respiratoy specimens during the acute phase of infection. The lowest concentration of SARS-CoV-2 viral copies this assay can detect is 131 copies/mL. A negative result does not preclude SARS-Cov-2 infection and should not be used as the sole basis for treatment or other patient management decisions. A negative result may occur with  improper specimen collection/handling, submission of specimen other than nasopharyngeal swab, presence of viral mutation(s) within the areas targeted by this assay, and inadequate number of viral copies (<131 copies/mL). A negative result must be combined with clinical observations, patient history, and epidemiological information. The  expected result is Negative. Fact Sheet for Patients:  https://www.moore.com/ Fact Sheet for Healthcare Providers:  https://www.young.biz/ This test is not yet ap proved or cleared by the [place] FDA and  has been authorized for detection and/or diagnosis of SARS-CoV-2 by FDA under an Emergency Use Authorization (EUA). This EUA will remain  in effect (meaning this test can be used) for the duration of the COVID-19 declaration under Section 564(b)(1) of the Act, 21 U.S.C. section 360bbb-3(b)(1), unless the authorization is terminated or revoked sooner.    Influenza A by PCR NEGATIVE NEGATIVE Final    Influenza B by PCR NEGATIVE NEGATIVE Final    Comment: (NOTE) The Xpert Xpress SARS-CoV-2/FLU/RSV assay is intended as an aid in  the diagnosis of influenza from Nasopharyngeal swab specimens and  should not be used as a sole basis for treatment. Nasal washings and  aspirates are unacceptable for Xpert Xpress SARS-CoV-2/FLU/RSV  testing. Fact Sheet for Patients: https://www.moore.com/ Fact Sheet for Healthcare Providers: https://www.young.biz/ This test is not yet approved or cleared by the [place] FDA and  has been authorized for detection and/or diagnosis of SARS-CoV-2 by  FDA under an Emergency Use Authorization (EUA). This EUA will remain  in effect (meaning this test can be used) for the duration of the  Covid-19 declaration under Section 564(b)(1) of the Act, 21  U.S.C. section 360bbb-3(b)(1), unless the authorization is  terminated or revoked. Performed at [facility], [address], [place], [place] [address]   MRSA PCR Screening     Status: None   Collection Time: 05/13/19 12:43 AM   Specimen: Nasopharyngeal  Result Value Ref Range Status   MRSA by PCR NEGATIVE NEGATIVE Final    Comment:        The GeneXpert MRSA Assay (FDA approved for NASAL specimens only), is one component of a comprehensive MRSA colonization surveillance program. It is not intended to diagnose MRSA infection nor to guide or monitor treatment for MRSA infections. Performed at [facility], [address], [place], [place] [address]      Labs: Basic Metabolic Panel: Recent Labs  Lab 05/12/19 0754 05/12/19 0901 05/14/19 0621 05/15/19 0115 05/15/19 1616  NA 141  --  138 137 140  K 3.2*  --  3.2* 2.7* 4.3  CL 102  --  103 102 107  CO2 21*  --  25 27 29   GLUCOSE 133*  --  85 118* 117*  BUN 16  --  12 8 9   CREATININE 1.22  --  0.98 1.02 1.08  CALCIUM 9.5  --  8.4* 8.2* 8.5*  MG  --  1.7  --  1.7  --    Liver  Function Tests: Recent Labs  Lab 05/12/19 0754  AST 27  ALT 17  ALKPHOS 50  BILITOT 1.3*  PROT 8.6*  ALBUMIN 4.4   Recent Labs  Lab 05/12/19 0754  LIPASE 22   No results for input(s): AMMONIA in the last 168 hours. CBC: Recent Labs  Lab 05/12/19 0754  WBC 13.2*  HGB 13.4  HCT 39.0  MCV 98.5  PLT 383   Cardiac Enzymes: No results for input(s): CKTOTAL, CKMB, CKMBINDEX, TROPONINI in the last 168 hours. BNP: BNP (last 3 results) Recent Labs    05/01/19 2210  BNP 70.6    ProBNP (last 3 results) No results for input(s): PROBNP in the last 8760 hours.  CBG: Recent Labs  Lab 05/13/19 1218 05/13/19 1514 05/13/19 1752 05/13/19 2338 05/14/19 1155  GLUCAP 99 111* 104* 92 135*       Signed:  Briant Cedar, MD [facility] 05/15/2019, 5:37 PM

## 2019-05-15 NOTE — Progress Notes (Signed)
Occupational Therapy Evaluation Only Patient Details Name: Anthony Atkinson MRN: [id number] DOB: [birth year] Today's Date: 05/15/2019    History of Present Illness Pt is 49 yo male who presented with nausea and vomitting.  He was admitted with new onset generalized seizures due to PRES/hypertensive emergency and possible alcohol withdrawal syndrome.   Pt was in ICU for tight blood pressure control but now with orders to transfer to medical telemetry.   Clinical Impression   PTA pt resided with wife, independent in all ADL, IADL, and mobility tasks. Pt does not ambulate with an assistive device and works as a Geophysicist/field seismologist for [employer]. Pt able to ambulate around room and to/from bathroom independently without an assistive device, noting 0 instances of LOB. Pt independent with toileting, grooming/hygiene at the sink, and LB dressing task. Simulated tub/shower transfer with pt able to complete without UE support. Educated pt on safety strategies, activity pacing, and compensatory techniques to increase balance and safety with ADLs at home with good understanding. Skilled OT services not warranted at this time as pt appears to be functioning at/near baseline for self-care and mobility tasks. All questions/concerns answered at this time.     Follow Up Recommendations  No OT follow up    Equipment Recommendations  None recommended by OT    Recommendations for Other Services       Precautions / Restrictions Precautions Precautions: Other (comment) Precaution Comments: Seizure precautions Restrictions Weight Bearing Restrictions: No      Mobility Bed Mobility Overal bed mobility: Independent             General bed mobility comments: HOB flat, without use of bed rail  Transfers Overall transfer level: Independent Equipment used: None Transfers: Sit to/from Stand Sit to Stand: Independent         General transfer comment: Pt able to ambulate around room independently. Pt completed  toilet transfer and simulated tub/shower transfer independently. Noted 0 instances of LOB.     Balance Overall balance assessment: No apparent balance deficits (not formally assessed)                                         ADL either performed or assessed with clinical judgement   ADL Overall ADL's : At baseline;Independent                                       General ADL Comments: Pt completed toileting, dressing, and grooming/hygiene tasks independently. Simulated tub/shower transfer with pt able to complete independently without use of grab bar. Pt able to ambulate around room independently, noting 0 instances of LOB.      Vision Baseline Vision/History: Wears glasses Wears Glasses: Reading only       Perception     Praxis      Pertinent Vitals/Pain Pain Assessment: No/denies pain     Hand Dominance Left   Extremity/Trunk Assessment Upper Extremity Assessment Upper Extremity Assessment: Overall WFL for tasks assessed   Lower Extremity Assessment Lower Extremity Assessment: Defer to PT evaluation       Communication Communication Communication: No difficulties   Cognition Arousal/Alertness: Awake/alert Behavior During Therapy: WFL for tasks assessed/performed Overall Cognitive Status: Within Functional Limits for tasks assessed  General Comments  VSS on RA    Exercises     Shoulder Instructions      Home Living Family/patient expects to be discharged to:: Private residence Living Arrangements: Spouse/significant other Available Help at Discharge: Family;Available PRN/intermittently Type of Home: House Home Access: Level entry     Home Layout: Multi-level Alternate Level Stairs-Number of Steps: 12 Alternate Level Stairs-Rails: Can reach both;Left;Right Bathroom Shower/Tub: Tub/shower unit;Walk-in shower   Bathroom Toilet: Standard     Home Equipment: None           Prior Functioning/Environment Level of Independence: Independent        Comments: Pt independent in ADLs, IADLs, and mobility. Pt does not ambulate with an assistive device. Pt still works and drives. 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[employer]        OT Problem List:        OT Treatment/Interventions:      OT Goals(Current goals can be found in the care plan section)    OT Frequency:     Barriers to D/C:            Co-evaluation              AM-PAC OT "6 Clicks" Daily Activity     Outcome Measure Help from another person eating meals?: None Help from another person taking care of personal grooming?: None Help from another person toileting, which includes using toliet, bedpan, or urinal?: None Help from another person bathing (including washing, rinsing, drying)?: None Help from another person to put on and taking off regular upper body clothing?: None Help from another person to put on and taking off regular lower body clothing?: None 6 Click Score: 24   End of Session Nurse Communication: Mobility status  Activity Tolerance: Patient tolerated treatment well Patient left: in chair;with call bell/phone within reach  OT Visit Diagnosis: Muscle weakness (generalized) (M62.81)                Time: 5366-4403 OT Time Calculation (min): 15 min Charges:  OT General Charges $OT Visit: 1 Visit OT Evaluation $OT Eval Low Complexity: 1 Low  Peterson Ao OTR/L [phone]  Peterson Ao 05/15/2019, 8:04 AM

## 2019-05-16 ENCOUNTER — Telehealth: Payer: Self-pay | Admitting: *Deleted

## 2019-05-16 NOTE — Telephone Encounter (Signed)
Transition Care Management Follow-up Telephone Call   Date discharged? 05/15/19   How have you been since you were released from the hospital? Pt states he is doing alright   Do you understand why you were in the hospital? YES   Do you understand the discharge instructions? YES   Where were you discharged to? HOME   Items Reviewed:  Medications reviewed: YES, currently taking keppra for 2 weeks  Allergies reviewed: YES  Dietary changes reviewed: YES, heart healthy  Referrals reviewed: He states he has to call and make f/u appt w/his neurologist   Functional Questionnaire:   Activities of Daily Living (ADLs):   He states he are independent in the following: ambulation, bathing and hygiene, feeding, continence, grooming, toileting and dressing States he doesn't require assistance    Any transportation issues/concerns?: NO   Any patient concerns? NO   Confirmed importance and date/time of follow-up visits scheduled YES, appt 05/23/19  Provider Appointment booked with Dr. Okey Dupre  Confirmed with patient if condition begins to worsen call PCP or go to the ER.  Patient was given the office number and encouraged to call back with question or concerns.  : YES

## 2019-05-23 ENCOUNTER — Encounter: Payer: Self-pay | Admitting: Internal Medicine

## 2019-05-23 ENCOUNTER — Other Ambulatory Visit: Payer: Self-pay

## 2019-05-23 ENCOUNTER — Ambulatory Visit (INDEPENDENT_AMBULATORY_CARE_PROVIDER_SITE_OTHER): Payer: 59 | Admitting: Internal Medicine

## 2019-05-23 VITALS — BP 136/82 | HR 65 | Temp 98.8°F | Ht 73.0 in | Wt 184.0 lb

## 2019-05-23 DIAGNOSIS — R112 Nausea with vomiting, unspecified: Secondary | ICD-10-CM

## 2019-05-23 DIAGNOSIS — I6783 Posterior reversible encephalopathy syndrome: Secondary | ICD-10-CM

## 2019-05-23 DIAGNOSIS — I10 Essential (primary) hypertension: Secondary | ICD-10-CM | POA: Diagnosis not present

## 2019-05-23 LAB — COMPREHENSIVE METABOLIC PANEL
ALT: 13 U/L (ref 0–53)
AST: 12 U/L (ref 0–37)
Albumin: 3.8 g/dL (ref 3.5–5.2)
Alkaline Phosphatase: 49 U/L (ref 39–117)
BUN: 8 mg/dL (ref 6–23)
CO2: 32 mEq/L (ref 19–32)
Calcium: 9.2 mg/dL (ref 8.4–10.5)
Chloride: 106 mEq/L (ref 96–112)
Creatinine, Ser: 0.98 mg/dL (ref 0.40–1.50)
GFR: 97.95 mL/min (ref >60.00)
Glucose, Bld: 116 mg/dL — ABNORMAL HIGH (ref 70–99)
Potassium: 3.9 mEq/L (ref 3.5–5.1)
Sodium: 141 mEq/L (ref 135–145)
Total Bilirubin: 0.5 mg/dL (ref 0.2–1.2)
Total Protein: 7.1 g/dL (ref 6.0–8.3)

## 2019-05-23 LAB — CBC
HCT: 37.1 % — ABNORMAL LOW (ref 39.0–52.0)
Hemoglobin: 12.3 g/dL — ABNORMAL LOW (ref 13.0–17.0)
MCHC: 33.3 g/dL (ref 30.0–36.0)
MCV: 98.8 fl (ref 78.0–100.0)
Platelets: 402 10*3/uL — ABNORMAL HIGH (ref 150.0–400.0)
RBC: 3.75 Mil/uL — ABNORMAL LOW (ref 4.22–5.81)
RDW: 15.2 % (ref 11.5–15.5)
WBC: 5.8 10*3/uL (ref 4.0–10.5)

## 2019-05-23 NOTE — Assessment & Plan Note (Signed)
Resolved at this time was likely food induced gastroenteritis.

## 2019-05-23 NOTE — Patient Instructions (Signed)
We will check the labs today and call you back about the results.    

## 2019-05-23 NOTE — Progress Notes (Signed)
   Subjective:   Patient ID: Anthony Atkinson, male    DOB: 10-24-1970, 49 y.o.   MRN: [id number]  HPI The patient is a 49 YO man coming in for hospital follow up (in for PRES syndrome, seizure, hypertensive emergency). He is taking keppra for 2 weeks for prevention of further seizure. His BP has been running normal to low at home. This morning was 110/60s. Is taking clonidine only. Denies having to take his prn hydralazine at all. Monitors BP at least daily or BID. Denies headaches, chest pains. Denies recurrent confusion or seizures. Denies side effects with medications. Is not driving and has follow up with neurology in 2-3 weeks.   PMH, [facility], social history reviewed and updated  Review of Systems  Constitutional: Negative.   HENT: Negative.   Eyes: Negative.   Respiratory: Negative for cough, chest tightness and shortness of breath.   Cardiovascular: Negative for chest pain, palpitations and leg swelling.  Gastrointestinal: Negative for abdominal distention, abdominal pain, constipation, diarrhea, nausea and vomiting.  Musculoskeletal: Negative.   Skin: Negative.   Neurological: Negative.   Psychiatric/Behavioral: Negative.     Objective:  Physical Exam Constitutional:      Appearance: He is well-developed.  HENT:     Head: Normocephalic and atraumatic.  Cardiovascular:     Rate and Rhythm: Normal rate and regular rhythm.  Pulmonary:     Effort: Pulmonary effort is normal. No respiratory distress.     Breath sounds: Normal breath sounds. No wheezing or rales.  Abdominal:     General: Bowel sounds are normal. There is no distension.     Palpations: Abdomen is soft.     Tenderness: There is no abdominal tenderness. There is no rebound.  Musculoskeletal:     Cervical back: Normal range of motion.  Skin:    General: Skin is warm and dry.  Neurological:     Mental Status: He is alert and oriented to person, place, and time.     Coordination: Coordination normal.      Vitals:   05/23/19 1421  BP: 136/82  Pulse: 65  Temp: 98.8 F (37.1 C)  TempSrc: Oral  SpO2: 98%  Weight: 184 lb (83.5 kg)  Height: 6\' 1"  (1.854 m)    This visit occurred during the SARS-CoV-2 public health emergency.  Safety protocols were in place, including screening questions prior to the visit, additional usage of staff PPE, and extensive cleaning of exam room while observing appropriate contact time as indicated for disinfecting solutions.   Assessment & Plan:

## 2019-05-23 NOTE — Assessment & Plan Note (Signed)
BP at goal on clonidine 0.3 mg BID. Has hydralazine 25 mg BID prn for high BP (when he eats pork typically). Checking CBC and CMP.

## 2019-05-23 NOTE — Assessment & Plan Note (Signed)
Talked about this and the need for BP management and if cannot swallow pills we may need alternate formulation for back up. Currently doing well with clonidine 0.3 mg BID.

## 2019-06-14 ENCOUNTER — Ambulatory Visit (INDEPENDENT_AMBULATORY_CARE_PROVIDER_SITE_OTHER): Payer: 59 | Admitting: Diagnostic Neuroimaging

## 2019-06-14 ENCOUNTER — Encounter: Payer: Self-pay | Admitting: Diagnostic Neuroimaging

## 2019-06-14 ENCOUNTER — Other Ambulatory Visit: Payer: Self-pay

## 2019-06-14 VITALS — BP 194/121 | HR 78 | Temp 98.1°F | Ht 73.0 in | Wt 189.0 lb

## 2019-06-14 DIAGNOSIS — I6783 Posterior reversible encephalopathy syndrome: Secondary | ICD-10-CM | POA: Diagnosis not present

## 2019-06-14 DIAGNOSIS — R569 Unspecified convulsions: Secondary | ICD-10-CM | POA: Diagnosis not present

## 2019-06-14 DIAGNOSIS — I10 Essential (primary) hypertension: Secondary | ICD-10-CM

## 2019-06-14 NOTE — Patient Instructions (Signed)
PRES / HYPERTENSIVE EMERGENCY  - history of labile BP; follow up with PCP - repeat MRI brain to ensure resolution of PRES changes - hold off on anti-seizure medication at this time - would recommend to hold off on driving until BP fluctuations and cyclic vomiting stabilize; so far has been seizure free since 05/12/19 - advised to avoid cannabis and alcohol

## 2019-06-14 NOTE — Progress Notes (Signed)
[facility]  PATIENT: Anthony Atkinson DOB: [birth year]  REFERRING CLINICIAN: Briant Cedar, MD HISTORY FROM: patient  REASON FOR VISIT: new consult    HISTORICAL  CHIEF COMPLAINT:  Chief Complaint  Patient presents with  . New Patient (Initial Visit)    seizure like activity; pt states his blood pressure was high (PRES) and he had seizure like symptoms. He said it's the first time this has ever happened. His doctor switched his BP medication in the middle of his stomach issues (was in ED for this).   . Referral    ED referral  . Room 7    here alone     HISTORY OF PRESENT ILLNESS:   49 year old male here for evaluation of posterior reversible encephalopathy syndrome and hypertensive emergency.  Since December 2020 patient has had multiple emergency room visits for uncontrolled nausea and vomiting.  Consideration of cannabis and alcohol use as cause of the nausea vomiting has been raised.  He has seen PCP and GI for this issue.  He was having trouble keeping his blood pressure medications in his system due to intractable vomiting.  In addition his blood pressure has been labile sometimes excessively high as well as excessively low.  He had a syncopal event on 03/19/2019 due to low blood pressure and dehydration.  05/12/2019 patient went to the hospital for similar abdominal pain, nausea and vomiting.  BP on arrival was 159/120.  While in the emergency room patient had a witnessed seizure.  Following seizure blood pressure was as high as 208/140.  Patient was treated with antiseizure medication, blood pressure control and transferred to ICU.  MRI of the brain showed posterior vasogenic edema changes similar to PRES. patient was stabilized and returned to baseline.  He was recommended to use antiseizure medication for 2 weeks and follow-up with PCP for BP control.  Since that time no further seizures.  Blood pressure continues to fluctuate.  He is taking his  blood pressure medication now.  He has cut down on cannabis use.  He states he uses alcohol very occasionally, less than once per month.    REVIEW OF SYSTEMS: Full 14 system review of systems performed and negative with exception of: As per HPI.  ALLERGIES: No Known Allergies  HOME MEDICATIONS: Outpatient Medications Prior to Visit  Medication Sig Dispense Refill  . cloNIDine (CATAPRES) 0.3 MG tablet Take 1 tablet (0.3 mg total) by mouth 2 (two) times daily. 180 tablet 3  . hydrALAZINE (APRESOLINE) 25 MG tablet Take 1 tablet (25 mg total) by mouth 2 (two) times daily. 180 tablet 3  . omeprazole (PRILOSEC) 20 MG capsule Take 1 capsule (20 mg total) by mouth daily. 90 capsule 3  . ondansetron (ZOFRAN) 4 MG tablet Take 1 tablet (4 mg total) by mouth every 8 (eight) hours as needed for nausea or vomiting. 60 tablet 2  . sildenafil (VIAGRA) 100 MG tablet TAKE ONE-HALF TO ONE TABLET BY MOUTH DAILY AS NEEDED  FOR ERECTILE DYSFUNCTION. (Patient taking differently: Take 25-50 mg by mouth daily as needed for erectile dysfunction. ) 48 tablet 11  . tretinoin (RETIN-A) 0.05 % cream Apply 1 application topically 2 (two) times daily as needed (wound healing).     Marland Kitchen levETIRAcetam (KEPPRA) 750 MG tablet Take 1 tablet (750 mg total) by mouth 2 (two) times daily for 13 days. (Patient not taking: Reported on 06/14/2019) 26 tablet 0   No facility-administered medications prior to visit.    PAST MEDICAL HISTORY:  Past Medical History:  Diagnosis Date  . Esophageal perforation   . Hidradenitis    Bilateral Axilla  . Hypertension     PAST SURGICAL HISTORY: Past Surgical History:  Procedure Laterality Date  . FACIAL COSMETIC SURGERY  11/2015   cysts removed d/t infected ingrown hairs "3 wks ago" per pt (asked on 12/15/15)  . HYDRADENITIS EXCISION Bilateral 10/27/2016   Procedure: WIDE EXCISION HIDRADENITIS BILATERAL AXILLA;  Surgeon: Coralie Keens, MD;  Location: [place];  Service: General;  Laterality:  Bilateral;    FAMILY HISTORY: Family History  Problem Relation Age of Onset  . High blood pressure Mother   . Colon cancer Neg Hx   . Esophageal cancer Neg Hx   . Pancreatic cancer Neg Hx   . Stomach cancer Neg Hx   . Liver disease Neg Hx     SOCIAL HISTORY: Social History   Socioeconomic History  . Marital status: Married    Spouse name: Not on file  . Number of children: 2  . Years of education: Not on file  . Highest education level: GED or equivalent  Occupational History  . Not on file  Tobacco Use  . Smoking status: Former Smoker    Packs/day: 0.50    Types: Cigarettes    Quit date: 12/14/2015    Years since quitting: 3.5  . Smokeless tobacco: Never Used  Substance and Sexual Activity  . Alcohol use: Yes    Comment: occasional  . Drug use: Yes    Frequency: 7.0 times per week    Types: Marijuana    Comment: 1/day per pt  . Sexual activity: Yes    Partners: Female    Birth control/protection: None  Other Topics Concern  . Not on file  Social History Narrative   Lives at home with wife   Left handed   Caffeine: none, quit Dec 2020.   Social Determinants of Health   Financial Resource Strain:   . Difficulty of Paying Living Expenses:   Food Insecurity:   . Worried About Charity fundraiser in the Last Year:   . Arboriculturist in the Last Year:   Transportation Needs:   . Film/video editor (Medical):   Marland Kitchen Lack of Transportation (Non-Medical):   Physical Activity:   . Days of Exercise per Week:   . Minutes of Exercise per Session:   Stress:   . Feeling of Stress :   Social Connections:   . Frequency of Communication with Friends and Family:   . Frequency of Social Gatherings with Friends and Family:   . Attends Religious Services:   . Active Member of Clubs or Organizations:   . Attends Archivist Meetings:   Marland Kitchen Marital Status:   Intimate Partner Violence:   . Fear of Current or Ex-Partner:   . Emotionally Abused:   Marland Kitchen Physically  Abused:   . Sexually Abused:      PHYSICAL EXAM  GENERAL EXAM/CONSTITUTIONAL: Vitals:  Vitals:   06/14/19 0820  BP: (!) 194/121  Pulse: 78  Temp: 98.1 F (36.7 C)  Weight: 189 lb (85.7 kg)  Height: 6\' 1"  (1.854 m)     Body mass index is 24.94 kg/m. Wt Readings from Last 3 Encounters:  06/14/19 189 lb (85.7 kg)  05/23/19 184 lb (83.5 kg)  05/08/19 191 lb (86.6 kg)     Patient is in no distress; well developed, nourished and groomed; neck is supple  CARDIOVASCULAR:  Examination of carotid arteries  is normal; no carotid bruits  Regular rate and rhythm, no murmurs  Examination of peripheral vascular system by observation and palpation is normal  EYES:  Ophthalmoscopic exam of optic discs and posterior segments is normal; no papilledema or hemorrhages  No exam data present  MUSCULOSKELETAL:  Gait, strength, tone, movements noted in Neurologic exam below  NEUROLOGIC: MENTAL STATUS:  No flowsheet data found.  awake, alert, oriented to person, place and time  recent and remote memory intact  normal attention and concentration  language fluent, comprehension intact, naming intact  fund of knowledge appropriate  CRANIAL NERVE:   2nd - no papilledema on fundoscopic exam  2nd, 3rd, 4th, 6th - pupils equal and reactive to light, visual fields full to confrontation, extraocular muscles intact, no nystagmus  5th - facial sensation symmetric  7th - facial strength symmetric  8th - hearing intact  9th - palate elevates symmetrically, uvula midline  11th - shoulder shrug symmetric  12th - tongue protrusion midline  MOTOR:   normal bulk and tone, full strength in the BUE, BLE  SENSORY:   normal and symmetric to light touch, temperature, vibration  COORDINATION:   finger-nose-finger, fine finger movements normal  REFLEXES:   deep tendon reflexes present and symmetric  GAIT/STATION:   narrow based gait     DIAGNOSTIC DATA (LABS,  IMAGING, TESTING) - I reviewed patient records, labs, notes, testing and imaging myself where available.  Lab Results  Component Value Date   WBC 5.8 05/23/2019   HGB 12.3 (L) 05/23/2019   HCT 37.1 (L) 05/23/2019   MCV 98.8 05/23/2019   PLT 402.0 (H) 05/23/2019      Component Value Date/Time   NA 141 05/23/2019 1448   K 3.9 05/23/2019 1448   CL 106 05/23/2019 1448   CO2 32 05/23/2019 1448   GLUCOSE 116 (H) 05/23/2019 1448   BUN 8 05/23/2019 1448   CREATININE 0.98 05/23/2019 1448   CALCIUM 9.2 05/23/2019 1448   PROT 7.1 05/23/2019 1448   ALBUMIN 3.8 05/23/2019 1448   AST 12 05/23/2019 1448   ALT 13 05/23/2019 1448   ALKPHOS 49 05/23/2019 1448   BILITOT 0.5 05/23/2019 1448   GFRNONAA >60 05/15/2019 1616   GFRAA >60 05/15/2019 1616   Lab Results  Component Value Date   CHOL 218 (H) 02/16/2016   HDL 49.10 02/16/2016   LDLCALC 136 (H) 02/16/2016   TRIG 148 05/14/2019   CHOLHDL 4 02/16/2016   Lab Results  Component Value Date   HGBA1C 6.1 03/18/2019   Lab Results  Component Value Date   VITAMINB12 310 04/10/2019   Lab Results  Component Value Date   TSH 0.939 01/07/2015    05/12/19 MRI brain [I reviewed images myself and agree with interpretation. -VRP]  - Pattern of cortical edema consistent with posterior reversible encephalopathy. No evidence of true infarction or hemorrhage.    ASSESSMENT AND PLAN  49 y.o. year old male here with:  Dx:  1. PRES (posterior reversible encephalopathy syndrome)   2. Seizure (HCC)   3. Essential hypertension      PLAN:  PRES / HYPERTENSIVE EMERGENCY  - history of labile BP; 194/121 today; follow up with PCP - repeat MRI brain to ensure resolution of PRES changes - hold off on anti-seizure medication at this time - would recommend to hold off on driving until BP fluctuations and cyclic vomiting stabilize because these may have triggered PRES and seizure; so far has been seizure free since 05/12/19 - advised  to avoid  cannabis and alcohol  Orders Placed This Encounter  Procedures  . MR BRAIN W WO CONTRAST   Return for pending if symptoms worsen or fail to improve.  I reviewed images, labs, notes, records myself. I summarized findings and reviewed with patient, for this high risk condition (seizure; hypertensive emergency) requiring high complexity decision making.     Suanne Marker, MD 06/14/2019, 8:41 AM Certified in Neurology, Neurophysiology and Neuroimaging  [facility] [address] [place], [place] [address] [phone]

## 2019-06-16 ENCOUNTER — Other Ambulatory Visit: Payer: Self-pay

## 2019-06-16 ENCOUNTER — Ambulatory Visit
Admission: EM | Admit: 2019-06-16 | Discharge: 2019-06-16 | Disposition: A | Discharge status: Home / Self Care | Payer: 59 | Attending: Physician Assistant | Admitting: Physician Assistant

## 2019-06-16 DIAGNOSIS — M542 Cervicalgia: Secondary | ICD-10-CM

## 2019-06-16 DIAGNOSIS — M545 Low back pain, unspecified: Secondary | ICD-10-CM

## 2019-06-16 MED ORDER — MELOXICAM 7.5 MG PO TABS: 7.5000 mg | ORAL_TABLET | Freq: Every day | ORAL | 0 refills | Status: DC

## 2019-06-16 MED ORDER — METHOCARBAMOL 500 MG PO TABS: 500.0000 mg | ORAL_TABLET | Freq: Two times a day (BID) | ORAL | 0 refills | Status: DC
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
## 2019-06-19 ENCOUNTER — Telehealth: Payer: Self-pay | Admitting: Diagnostic Neuroimaging

## 2019-06-19 ENCOUNTER — Ambulatory Visit (INDEPENDENT_AMBULATORY_CARE_PROVIDER_SITE_OTHER): Payer: 59 | Admitting: Internal Medicine

## 2019-06-19 ENCOUNTER — Encounter: Payer: Self-pay | Admitting: Internal Medicine

## 2019-06-19 ENCOUNTER — Other Ambulatory Visit: Payer: Self-pay

## 2019-06-19 DIAGNOSIS — I10 Essential (primary) hypertension: Secondary | ICD-10-CM

## 2019-06-19 NOTE — Telephone Encounter (Signed)
LVM for pt to call back about scheduling [facility] Auth: [id number] (exp. 06/17/19 to 08/01/19)

## 2019-06-19 NOTE — Patient Instructions (Signed)
We will have you keep taking the hydralazine in the morning and the clonidine.

## 2019-06-19 NOTE — Progress Notes (Signed)
   Subjective:   Patient ID: Anthony Atkinson, male    DOB: 1970-12-23, 49 y.o.   MRN: [id number]  HPI The patient is a 49 YO man coming in for follow up blood pressure. Taking clonidine and hydralazine (daily) currently. He was noticing some high readings in the morning before taking clonidine (he takes around 8:30 am) so added hydralazine in the morning and this has helped. Denies stroke symptoms, seizures. Denies headaches or chest pains. Stopped seizure medication as planned without incident.   Review of Systems  Constitutional: Negative.   HENT: Negative.   Eyes: Negative.   Respiratory: Negative for cough, chest tightness and shortness of breath.   Cardiovascular: Negative for chest pain, palpitations and leg swelling.  Gastrointestinal: Negative for abdominal distention, abdominal pain, constipation, diarrhea, nausea and vomiting.  Musculoskeletal: Negative.   Skin: Negative.   Neurological: Negative.   Psychiatric/Behavioral: Negative.     Objective:  Physical Exam Constitutional:      Appearance: He is well-developed.  HENT:     Head: Normocephalic and atraumatic.  Cardiovascular:     Rate and Rhythm: Normal rate and regular rhythm.  Pulmonary:     Effort: Pulmonary effort is normal. No respiratory distress.     Breath sounds: Normal breath sounds. No wheezing or rales.  Abdominal:     General: Bowel sounds are normal. There is no distension.     Palpations: Abdomen is soft.     Tenderness: There is no abdominal tenderness. There is no rebound.  Musculoskeletal:     Cervical back: Normal range of motion.  Skin:    General: Skin is warm and dry.  Neurological:     Mental Status: He is alert and oriented to person, place, and time.     Coordination: Coordination normal.     Vitals:   06/19/19 0911  BP: 126/84  Pulse: 81  Temp: 98.8 F (37.1 C)  TempSrc: Oral  SpO2: 97%  Weight: 192 lb 6.4 oz (87.3 kg)  Height: 6\' 1"  (1.854 m)    This visit occurred  during the SARS-CoV-2 public health emergency.  Safety protocols were in place, including screening questions prior to the visit, additional usage of staff PPE, and extensive cleaning of exam room while observing appropriate contact time as indicated for disinfecting solutions.   Assessment & Plan:

## 2019-06-22 NOTE — Assessment & Plan Note (Signed)
BP at goal on clonidine BID and hydralazine daily. Will keep regimen same. No labs today.

## 2019-06-27 NOTE — Telephone Encounter (Signed)
Just an fyi.  I spoke to the patient he stated he wants to hold off on having his MRI right now because he has $3,000 in bills he needs to pay down first..

## 2019-06-27 NOTE — Telephone Encounter (Signed)
Pt returned call and LVM. Please call back when available.  

## 2019-07-16 ENCOUNTER — Other Ambulatory Visit: Payer: Self-pay

## 2019-07-16 ENCOUNTER — Encounter: Payer: Self-pay | Admitting: Diagnostic Neuroimaging

## 2019-07-16 ENCOUNTER — Ambulatory Visit (INDEPENDENT_AMBULATORY_CARE_PROVIDER_SITE_OTHER): Payer: 59 | Admitting: Diagnostic Neuroimaging

## 2019-07-16 VITALS — BP 206/113 | HR 72 | Temp 97.9°F | Ht 73.0 in | Wt 191.2 lb

## 2019-07-16 DIAGNOSIS — I6783 Posterior reversible encephalopathy syndrome: Secondary | ICD-10-CM | POA: Diagnosis not present

## 2019-07-16 DIAGNOSIS — R569 Unspecified convulsions: Secondary | ICD-10-CM
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
## 2019-07-16 NOTE — Patient Instructions (Signed)
-   Ok to return to driving and follow up with PCP.

## 2019-07-19 ENCOUNTER — Ambulatory Visit: Payer: 59 | Admitting: Internal Medicine

## 2019-07-23 ENCOUNTER — Encounter: Payer: Self-pay | Admitting: Internal Medicine

## 2019-07-23 ENCOUNTER — Ambulatory Visit (INDEPENDENT_AMBULATORY_CARE_PROVIDER_SITE_OTHER): Payer: 59 | Admitting: Internal Medicine

## 2019-07-23 ENCOUNTER — Other Ambulatory Visit: Payer: Self-pay

## 2019-07-23 VITALS — BP 164/98 | HR 82 | Temp 98.3°F | Ht 73.0 in | Wt 192.6 lb

## 2019-07-23 DIAGNOSIS — R569 Unspecified convulsions: Secondary | ICD-10-CM

## 2019-07-23 NOTE — Patient Instructions (Signed)
We have done the letter for you and for your work.

## 2019-07-23 NOTE — Progress Notes (Signed)
   Subjective:   Patient ID: Anthony Atkinson, male    DOB: [birth year], 49 y.o.   MRN: [id number]  HPI The patient is a 49 YO man coming in for concerns about driving. Seen his neurologist recently and was not very satisfied with this visit. Was told he needed to get a note from [place] for [facility] and that he could drive. Had a provoked seizure 05/12/19 due to PRES. He was on anti-seizure medications for a short time afterwards while BP stabilized. He did not have further seizure and is off medications for seizures currently. He does not have epilepsy. 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 Did not take BP meds yet this morning.   Review of Systems  Constitutional: Negative.   HENT: Negative.   Eyes: Negative.   Respiratory: Negative for cough, chest tightness and shortness of breath.   Cardiovascular: Negative for chest pain, palpitations and leg swelling.  Gastrointestinal: Negative for abdominal distention, abdominal pain, constipation, diarrhea, nausea and vomiting.  Musculoskeletal: Negative.   Skin: Negative.   Neurological: Negative.   Psychiatric/Behavioral: Negative.     Objective:  Physical Exam Constitutional:      Appearance: He is well-developed.  HENT:     Head: Normocephalic and atraumatic.  Cardiovascular:     Rate and Rhythm: Normal rate and regular rhythm.  Pulmonary:     Effort: Pulmonary effort is normal. No respiratory distress.     Breath sounds: Normal breath sounds. No wheezing or rales.  Abdominal:     General: Bowel sounds are normal. There is no distension.     Palpations: Abdomen is soft.     Tenderness: There is no abdominal tenderness. There is no rebound.  Musculoskeletal:     Cervical back: Normal range of motion.  Skin:    General: Skin is warm and dry.  Neurological:     Mental Status: He is alert and oriented to person, place, and time.     Coordination: Coordination normal.     Vitals:   07/23/19 0830  BP: (!) 164/98  Pulse: 82  Temp: 98.3 F  (36.8 C)  SpO2: 100%  Weight: 192 lb 9.6 oz (87.4 kg)  Height: 6\' 1"  (1.854 m)    This visit occurred during the SARS-CoV-2 public health emergency.  Safety protocols were in place, including screening questions prior to the visit, additional usage of staff PPE, and extensive cleaning of exam room while observing appropriate contact time as indicated for disinfecting solutions.   Assessment & Plan:

## 2019-07-23 NOTE — Assessment & Plan Note (Signed)
Informed of [place] law that persons be seizure free for 6 months prior to resuming driving and given work note explaining this. Assuming he continues to be seizure free could resume driving 4/72/07.

## 2019-10-22 ENCOUNTER — Other Ambulatory Visit: Payer: Self-pay

## 2019-10-22 ENCOUNTER — Ambulatory Visit (INDEPENDENT_AMBULATORY_CARE_PROVIDER_SITE_OTHER): Payer: 59 | Admitting: Internal Medicine

## 2019-10-22 ENCOUNTER — Encounter: Payer: Self-pay | Admitting: Internal Medicine

## 2019-10-22 DIAGNOSIS — I10 Essential (primary) hypertension: Secondary | ICD-10-CM | POA: Diagnosis not present

## 2019-10-22 NOTE — Progress Notes (Signed)
° °  Subjective:   Patient ID: Anthony Atkinson, male    DOB: Dec 04, 1970, 49 y.o.   MRN: [id number]  HPI The patient is a 49 YO man coming in for concerns about blood pressure. When he was trying to see the dentist recently he had high blood pressure reading to 160/90s. He denies feeling poorly at that time and was not having headaches or chest pains. He was anxious as he has dental insurance out of [place] and so providers around here are hesitant to take it which has been an issue for him recently. Recently at another office he sat for 2-3 hours waiting to see and then was sent home without any treatment.   Review of Systems  Constitutional: Negative.   HENT: Negative.   Eyes: Negative.   Respiratory: Negative for cough, chest tightness and shortness of breath.   Cardiovascular: Negative for chest pain, palpitations and leg swelling.  Gastrointestinal: Negative for abdominal distention, abdominal pain, constipation, diarrhea, nausea and vomiting.  Musculoskeletal: Negative.   Skin: Negative.   Neurological: Negative.   Psychiatric/Behavioral: Negative.     Objective:  Physical Exam Constitutional:      Appearance: He is well-developed.  HENT:     Head: Normocephalic and atraumatic.  Cardiovascular:     Rate and Rhythm: Normal rate and regular rhythm.  Pulmonary:     Effort: Pulmonary effort is normal. No respiratory distress.     Breath sounds: Normal breath sounds. No wheezing or rales.  Abdominal:     General: Bowel sounds are normal. There is no distension.     Palpations: Abdomen is soft.     Tenderness: There is no abdominal tenderness. There is no rebound.  Musculoskeletal:     Cervical back: Normal range of motion.  Skin:    General: Skin is warm and dry.  Neurological:     Mental Status: He is alert and oriented to person, place, and time.     Coordination: Coordination normal.     Vitals:   10/22/19 1318  BP: 126/80  Pulse: 85  Temp: 98.7 F (37.1 C)  TempSrc:  Oral  SpO2: 99%  Weight: 196 lb (88.9 kg)  Height: 6\' 1"  (1.854 m)    This visit occurred during the SARS-CoV-2 public health emergency.  Safety protocols were in place, including screening questions prior to the visit, additional usage of staff PPE, and extensive cleaning of exam room while observing appropriate contact time as indicated for disinfecting solutions.   Assessment & Plan:  Visit time 15 minutes in face to face communication with patient and coordination of care, additional 5 minutes spent in record review, coordination or care, ordering tests, communicating/referring to other healthcare professionals, documenting in medical records all on the same day of the visit for total time 20 minutes spent on the visit.

## 2019-10-22 NOTE — Assessment & Plan Note (Signed)
BP at goal today on meds. Letter done to allow dental treatment even with elevated readings.

## 2019-12-09 ENCOUNTER — Encounter: Payer: Self-pay | Admitting: Internal Medicine

## 2019-12-09 ENCOUNTER — Telehealth: Payer: Self-pay | Admitting: Internal Medicine

## 2019-12-09 NOTE — Telephone Encounter (Signed)
Ok to add start date in letter listed in media tab.

## 2019-12-09 NOTE — Telephone Encounter (Signed)
Patient is requesting a new letter.  His job will not accept the letter because they say it has not been 6 months since May. But the start is 05/12/19. So he needs a new letter saying the same stuff but that it starts on 05/12/19 and 6 months from that is 11/09/19.  Patient will pick up once ready.  (Shirron, we can could stamp the letter?)

## 2019-12-09 NOTE — Telephone Encounter (Signed)
Letter has been updated.  LVM to inform patient letter is ready to be picked up.

## 2020-05-02 ENCOUNTER — Other Ambulatory Visit: Payer: Self-pay | Admitting: Internal Medicine

## 2020-05-02 DIAGNOSIS — I10 Essential (primary) hypertension: Secondary | ICD-10-CM

## 2020-05-07 ENCOUNTER — Other Ambulatory Visit: Payer: Self-pay | Admitting: Internal Medicine

## 2020-06-18 ENCOUNTER — Emergency Department (HOSPITAL_COMMUNITY): Payer: 59

## 2020-06-18 ENCOUNTER — Other Ambulatory Visit: Payer: Self-pay

## 2020-06-18 ENCOUNTER — Emergency Department (HOSPITAL_COMMUNITY)
Admission: EM | Admit: 2020-06-18 | Discharge: 2020-06-18 | Disposition: A | Discharge status: Home / Self Care | Payer: 59 | Attending: Emergency Medicine | Admitting: Emergency Medicine

## 2020-06-18 DIAGNOSIS — I5032 Chronic diastolic (congestive) heart failure: Secondary | ICD-10-CM | POA: Diagnosis not present

## 2020-06-18 DIAGNOSIS — R1013 Epigastric pain: Secondary | ICD-10-CM | POA: Diagnosis not present

## 2020-06-18 DIAGNOSIS — K219 Gastro-esophageal reflux disease without esophagitis: Secondary | ICD-10-CM | POA: Diagnosis not present

## 2020-06-18 DIAGNOSIS — Z79899 Other long term (current) drug therapy: Secondary | ICD-10-CM | POA: Insufficient documentation

## 2020-06-18 DIAGNOSIS — I10 Essential (primary) hypertension: Secondary | ICD-10-CM

## 2020-06-18 DIAGNOSIS — I110 Hypertensive heart disease with heart failure: Secondary | ICD-10-CM | POA: Insufficient documentation

## 2020-06-18 DIAGNOSIS — Z87891 Personal history of nicotine dependence: Secondary | ICD-10-CM | POA: Diagnosis not present

## 2020-06-18 DIAGNOSIS — R112 Nausea with vomiting, unspecified: Secondary | ICD-10-CM | POA: Diagnosis present

## 2020-06-18 LAB — COMPREHENSIVE METABOLIC PANEL
ALT: 15 U/L (ref 0–44)
AST: 22 U/L (ref 15–41)
Albumin: 4.6 g/dL (ref 3.5–5.0)
Alkaline Phosphatase: 63 U/L (ref 38–126)
Anion gap: 14 (ref 5–15)
BUN: 17 mg/dL (ref 6–20)
CO2: 22 mmol/L (ref 22–32)
Calcium: 10.6 mg/dL — ABNORMAL HIGH (ref 8.9–10.3)
Chloride: 109 mmol/L (ref 98–111)
Creatinine, Ser: 1.23 mg/dL (ref 0.61–1.24)
GFR, Estimated: >60 mL/min (ref >60)
Glucose, Bld: 190 mg/dL — ABNORMAL HIGH (ref 70–99)
Potassium: 3.6 mmol/L (ref 3.5–5.1)
Sodium: 145 mmol/L (ref 135–145)
Total Bilirubin: 1.1 mg/dL (ref 0.3–1.2)
Total Protein: 8.7 g/dL — ABNORMAL HIGH (ref 6.5–8.1)

## 2020-06-18 LAB — CBC
HCT: 43.9 % (ref 39.0–52.0)
Hemoglobin: 14.9 g/dL (ref 13.0–17.0)
MCH: 33.4 pg (ref 26.0–34.0)
MCHC: 33.9 g/dL (ref 30.0–36.0)
MCV: 98.4 fL (ref 80.0–100.0)
Platelets: 313 10*3/uL (ref 150–400)
RBC: 4.46 MIL/uL (ref 4.22–5.81)
RDW: 13.6 % (ref 11.5–15.5)
WBC: 19.7 10*3/uL — ABNORMAL HIGH (ref 4.0–10.5)
nRBC: 0 % (ref 0.0–0.2)

## 2020-06-18 LAB — URINALYSIS, ROUTINE W REFLEX MICROSCOPIC
Bacteria, UA: NONE SEEN
Bilirubin Urine: NEGATIVE
Glucose, UA: 150 mg/dL — AB
Hgb urine dipstick: NEGATIVE
Ketones, ur: 5 mg/dL — AB
Leukocytes,Ua: NEGATIVE
Nitrite: NEGATIVE
Protein, ur: 100 mg/dL — AB
Specific Gravity, Urine: 1.027 (ref 1.005–1.030)
pH: 5 (ref 5.0–8.0)

## 2020-06-18 LAB — RAPID URINE DRUG SCREEN, HOSP PERFORMED
Amphetamines: NOT DETECTED
Barbiturates: NOT DETECTED
Benzodiazepines: NOT DETECTED
Cocaine: NOT DETECTED
Opiates: NOT DETECTED
Tetrahydrocannabinol: POSITIVE — AB

## 2020-06-18 LAB — LIPASE, BLOOD: Lipase: 55 U/L — ABNORMAL HIGH (ref 11–51)

## 2020-06-18 MED ORDER — ONDANSETRON 4 MG PO TBDP
4.0000 mg | ORAL_TABLET | Freq: Once | ORAL | Status: AC | PRN
  Administered 2020-06-18: 4 mg via ORAL
  Filled 2020-06-18: qty 1

## 2020-06-18 MED ORDER — MORPHINE SULFATE (PF) 4 MG/ML IV SOLN
4.0000 mg | Freq: Once | INTRAVENOUS | Status: AC
  Administered 2020-06-18: 4 mg via INTRAVENOUS
  Filled 2020-06-18: qty 1

## 2020-06-18 MED ORDER — DICYCLOMINE HCL 10 MG/ML IM SOLN
20.0000 mg | Freq: Once | INTRAMUSCULAR | Status: AC
  Administered 2020-06-18: 20 mg via INTRAMUSCULAR
  Filled 2020-06-18: qty 2

## 2020-06-18 MED ORDER — PROMETHAZINE HCL 25 MG PO TABS: 25.0000 mg | ORAL_TABLET | Freq: Once | ORAL | Status: DC

## 2020-06-18 MED ORDER — IOHEXOL 300 MG/ML  SOLN
100.0000 mL | Freq: Once | INTRAMUSCULAR | Status: AC | PRN
  Administered 2020-06-18: 100 mL via INTRAVENOUS

## 2020-06-18 MED ORDER — PANTOPRAZOLE SODIUM 40 MG IV SOLR
40.0000 mg | Freq: Once | INTRAVENOUS | Status: AC
  Administered 2020-06-18: 40 mg via INTRAVENOUS
  Filled 2020-06-18: qty 40

## 2020-06-18 MED ORDER — SODIUM CHLORIDE 0.9 % IV BOLUS
1000.0000 mL | Freq: Once | INTRAVENOUS | Status: AC
  Administered 2020-06-18: 1000 mL via INTRAVENOUS

## 2020-06-18 MED ORDER — HYDROMORPHONE HCL 1 MG/ML IJ SOLN
1.0000 mg | Freq: Once | INTRAMUSCULAR | Status: AC
  Administered 2020-06-18: 1 mg via INTRAVENOUS
  Filled 2020-06-18: qty 1

## 2020-06-18 MED ORDER — FAMOTIDINE IN NACL 20-0.9 MG/50ML-% IV SOLN
20.0000 mg | Freq: Once | INTRAVENOUS | Status: AC
  Administered 2020-06-18: 20 mg via INTRAVENOUS
  Filled 2020-06-18: qty 50

## 2020-06-18 MED ORDER — HYDRALAZINE HCL 20 MG/ML IJ SOLN
25.0000 mg | Freq: Once | INTRAMUSCULAR | Status: AC
  Administered 2020-06-18: 25 mg via INTRAVENOUS
  Filled 2020-06-18: qty 2

## 2020-06-18 MED ORDER — OMEPRAZOLE 20 MG PO CPDR: 20.0000 mg | DELAYED_RELEASE_CAPSULE | Freq: Every day | ORAL | 0 refills | Status: DC

## 2020-06-18 MED ORDER — DROPERIDOL 2.5 MG/ML IJ SOLN
1.2500 mg | Freq: Once | INTRAMUSCULAR | Status: AC
  Administered 2020-06-18: 1.25 mg via INTRAVENOUS
  Filled 2020-06-18: qty 2

## 2020-06-18 MED ORDER — AMLODIPINE BESYLATE 5 MG PO TABS: 5.0000 mg | ORAL_TABLET | Freq: Every day | ORAL | 0 refills | Status: DC

## 2020-06-18 MED ORDER — LABETALOL HCL 5 MG/ML IV SOLN
10.0000 mg | Freq: Once | INTRAVENOUS | Status: AC
  Administered 2020-06-18: 10 mg via INTRAVENOUS
  Filled 2020-06-18: qty 4

## 2020-06-18 NOTE — ED Triage Notes (Signed)
Pt from home for eval of central abdominal pain with n/v since waking up this morning. Denies diarrhea.
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
## 2020-06-18 NOTE — Discharge Instructions (Signed)
Your lab work and imaging look reassuring.  Starting you on a new blood pressure medication please take in conjunction with the other medications.  I want you to monitor blood pressure for the next couple days, when going from a seated to a standing position please have 3 points of contact.  I have also gave you a prescription for a acid pill please take as prescribed this will help with your epigastric pain which I suspect is from gastritis due to vomiting.  Please stop smoking marijuana as I feel this is causing your nausea and vomiting.  I want you to follow-up with your PCP in 1 week's time for reevaluation.  Come back to the emergency department if you develop chest pain, shortness of breath, severe abdominal pain, uncontrolled nausea, vomiting, diarrhea.

## 2020-07-02 ENCOUNTER — Other Ambulatory Visit: Payer: Self-pay | Admitting: Internal Medicine

## 2020-07-31 IMAGING — DX DG CHEST 1V PORT
2 series · 2 of 2 positions shown · non-contrast
Comparison: 12/15/2015

CLINICAL DATA: Vomiting.

EXAM:
PORTABLE CHEST 1 VIEW

[chest ap (1 of 2)]
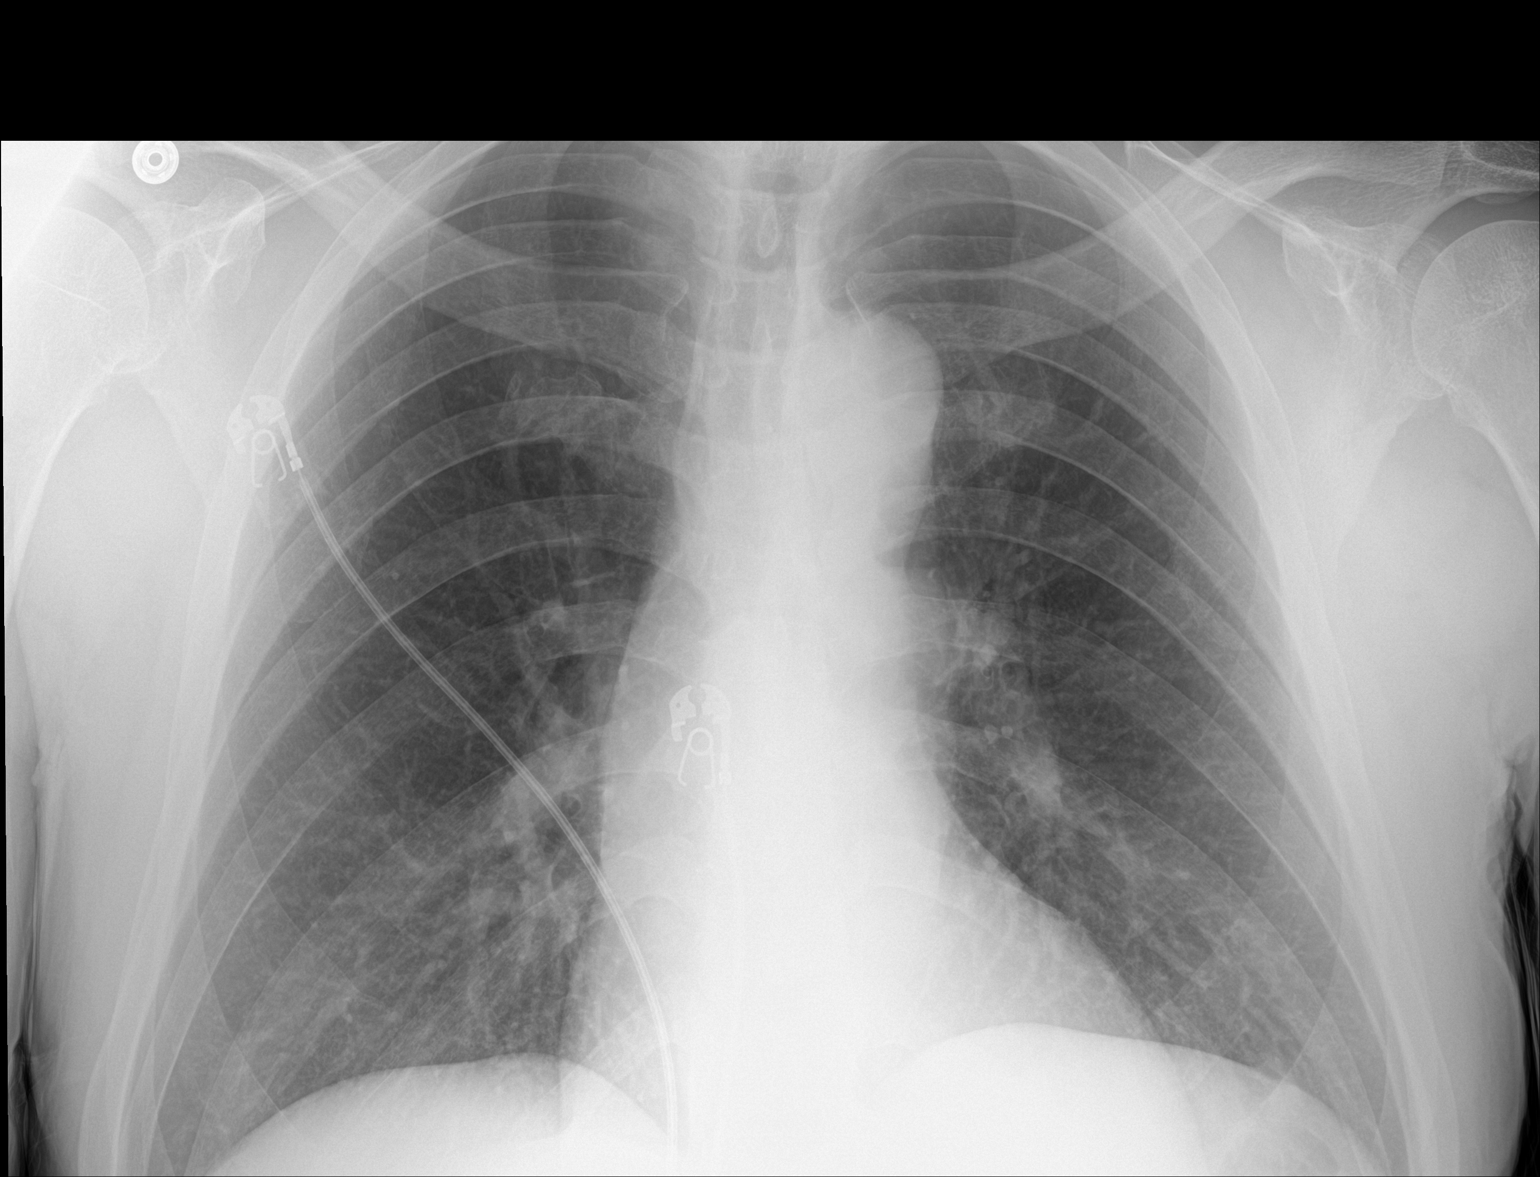

[chest ap (2 of 2)]
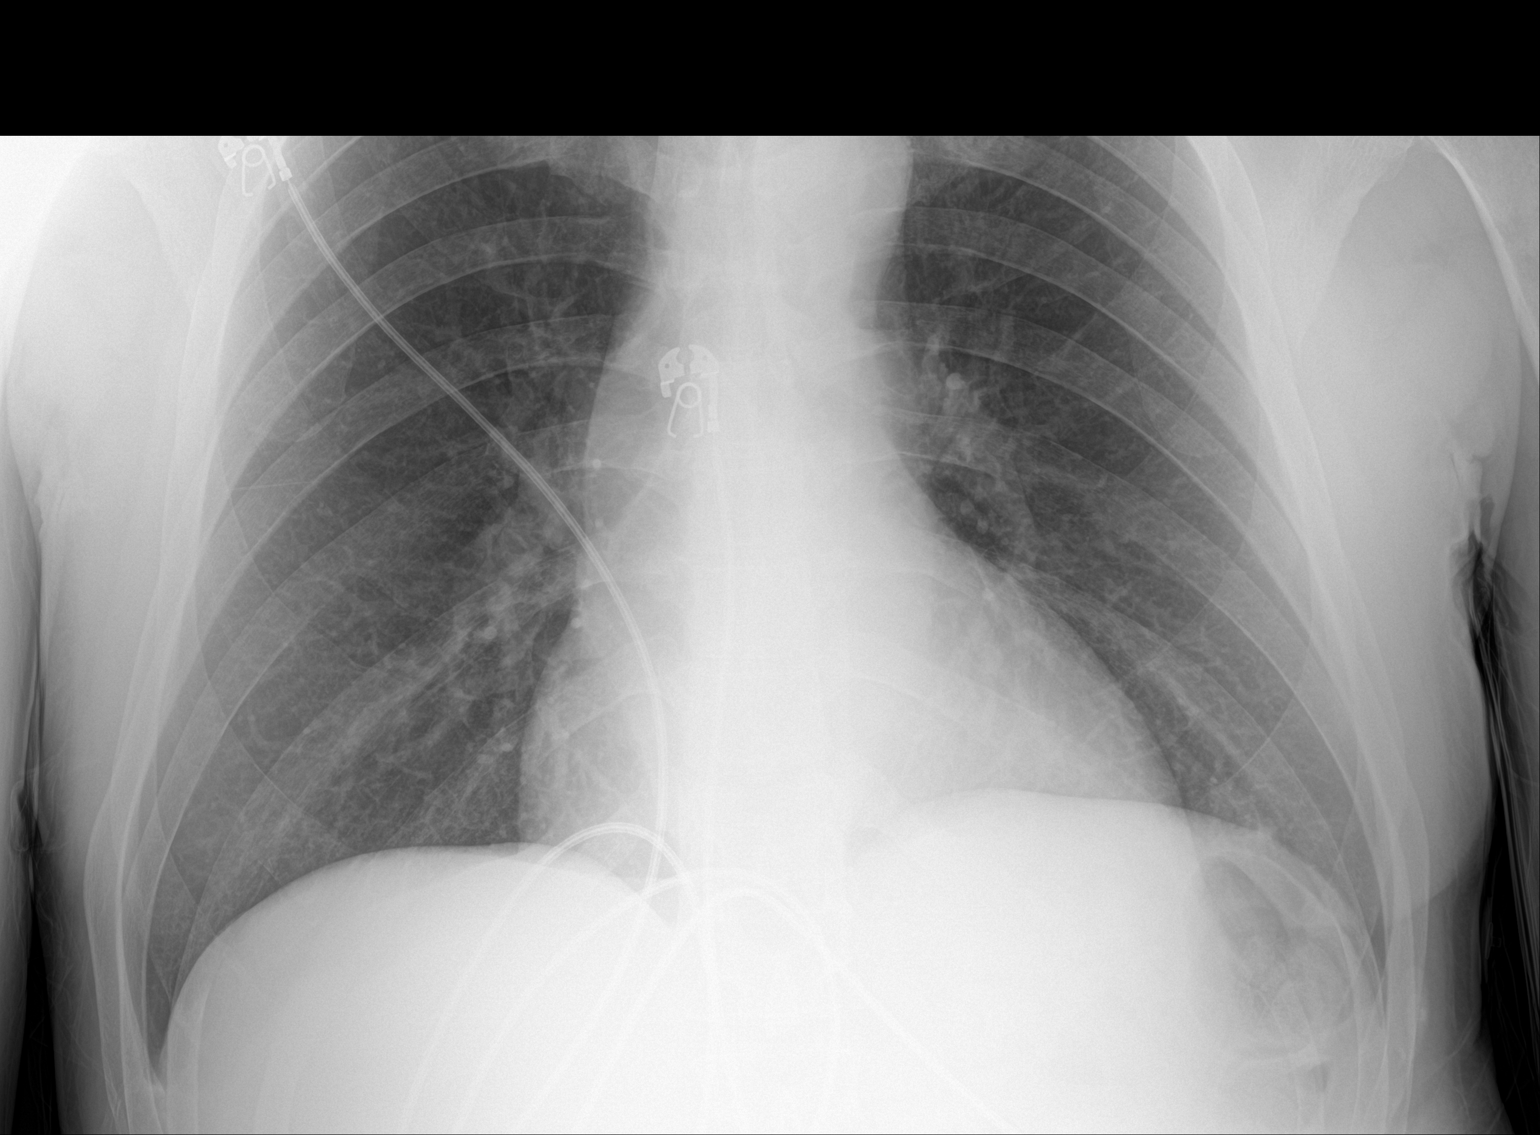

[2 of 2 positions shown; findings below may reference images not displayed]

FINDINGS: The heart size and mediastinal contours are within normal limits.
Both lungs are clear. The visualized skeletal structures are
unremarkable.
IMPRESSION: No active disease.

## 2020-08-09 ENCOUNTER — Other Ambulatory Visit: Payer: Self-pay | Admitting: Internal Medicine

## 2020-08-09 DIAGNOSIS — I10 Essential (primary) hypertension: Secondary | ICD-10-CM

## 2020-08-14 ENCOUNTER — Other Ambulatory Visit: Payer: Self-pay | Admitting: Internal Medicine

## 2020-09-08 ENCOUNTER — Other Ambulatory Visit: Payer: Self-pay

## 2020-09-08 ENCOUNTER — Ambulatory Visit (INDEPENDENT_AMBULATORY_CARE_PROVIDER_SITE_OTHER): Payer: 59 | Admitting: Internal Medicine

## 2020-09-08 ENCOUNTER — Encounter: Payer: Self-pay | Admitting: Internal Medicine

## 2020-09-08 VITALS — BP 127/78 | HR 73 | Temp 97.9°F | Resp 18 | Ht 73.0 in | Wt 196.6 lb

## 2020-09-08 DIAGNOSIS — I10 Essential (primary) hypertension: Secondary | ICD-10-CM | POA: Diagnosis not present

## 2020-09-08 DIAGNOSIS — R739 Hyperglycemia, unspecified: Secondary | ICD-10-CM

## 2020-09-08 DIAGNOSIS — J431 Panlobular emphysema: Secondary | ICD-10-CM

## 2020-09-08 DIAGNOSIS — Z0000 Encounter for general adult medical examination without abnormal findings: Secondary | ICD-10-CM | POA: Insufficient documentation

## 2020-09-08 DIAGNOSIS — Z1211 Encounter for screening for malignant neoplasm of colon: Secondary | ICD-10-CM | POA: Diagnosis not present

## 2020-09-08 LAB — COMPREHENSIVE METABOLIC PANEL
ALT: 10 U/L (ref 0–53)
AST: 12 U/L (ref 0–37)
Albumin: 4.5 g/dL (ref 3.5–5.2)
Alkaline Phosphatase: 61 U/L (ref 39–117)
BUN: 17 mg/dL (ref 6–23)
CO2: 29 mEq/L (ref 19–32)
Calcium: 9.7 mg/dL (ref 8.4–10.5)
Chloride: 102 mEq/L (ref 96–112)
Creatinine, Ser: 1 mg/dL (ref 0.40–1.50)
GFR: 87.26 mL/min (ref >60.00)
Glucose, Bld: 117 mg/dL — ABNORMAL HIGH (ref 70–99)
Potassium: 3.7 mEq/L (ref 3.5–5.1)
Sodium: 138 mEq/L (ref 135–145)
Total Bilirubin: 0.4 mg/dL (ref 0.2–1.2)
Total Protein: 7.9 g/dL (ref 6.0–8.3)

## 2020-09-08 LAB — LIPID PANEL
Cholesterol: 189 mg/dL (ref 0–200)
HDL: 41.1 mg/dL (ref >39.00)
NonHDL: 148.38
Total CHOL/HDL Ratio: 5
Triglycerides: 215 mg/dL — ABNORMAL HIGH (ref 0.0–149.0)
VLDL: 43 mg/dL — ABNORMAL HIGH (ref 0.0–40.0)

## 2020-09-08 LAB — CBC
HCT: 39.6 % (ref 39.0–52.0)
Hemoglobin: 13.6 g/dL (ref 13.0–17.0)
MCHC: 34.2 g/dL (ref 30.0–36.0)
MCV: 99 fl (ref 78.0–100.0)
Platelets: 317 10*3/uL (ref 150.0–400.0)
RBC: 4 Mil/uL — ABNORMAL LOW (ref 4.22–5.81)
RDW: 14.2 % (ref 11.5–15.5)
WBC: 8 10*3/uL (ref 4.0–10.5)

## 2020-09-08 LAB — HEMOGLOBIN A1C: Hgb A1c MFr Bld: 6.1 % (ref 4.6–6.5)

## 2020-09-08 LAB — LDL CHOLESTEROL, DIRECT: Direct LDL: 107 mg/dL

## 2020-09-08 MED ORDER — CLONIDINE HCL 0.3 MG PO TABS: 0.3000 mg | ORAL_TABLET | Freq: Two times a day (BID) | ORAL | 3 refills | Status: DC

## 2020-09-08 MED ORDER — ONDANSETRON HCL 4 MG PO TABS: 4.0000 mg | ORAL_TABLET | Freq: Three times a day (TID) | ORAL | 2 refills | Status: DC | PRN

## 2020-09-08 MED ORDER — HYDRALAZINE HCL 25 MG PO TABS: 25.0000 mg | ORAL_TABLET | Freq: Two times a day (BID) | ORAL | 3 refills | Status: DC

## 2020-09-08 NOTE — Assessment & Plan Note (Signed)
Denies SOB. Former smoker and advised not to resume.

## 2020-09-08 NOTE — Assessment & Plan Note (Signed)
Taking hydralazine and clonidine and refilled today. Checking CMP.

## 2020-09-08 NOTE — Assessment & Plan Note (Signed)
Checking HgA1c today.  

## 2020-09-08 NOTE — Assessment & Plan Note (Signed)
Flu shot yearly. Covid-19 counseled booster due. Shingrix counseled declines. Tetanus up to date. Cologuard ordered. Counseled about sun safety and mole surveillance. Counseled about the dangers of distracted driving. Given 10 year screening recommendations.

## 2020-09-08 NOTE — Patient Instructions (Addendum)
Think about getting the shingles vaccine.  We will have the cologuard to your house.

## 2020-09-08 NOTE — Progress Notes (Signed)
   Subjective:   Patient ID: Anthony Atkinson, male    DOB: 12-Jul-1970, 50 y.o.   MRN: [id number]  HPI The patient is a 50 YO man coming in for physical.   PMH, FMH, social history reviewed and updated  Review of Systems  Constitutional: Negative.   HENT: Negative.    Eyes: Negative.   Respiratory:  Negative for cough, chest tightness and shortness of breath.   Cardiovascular:  Negative for chest pain, palpitations and leg swelling.  Gastrointestinal:  Negative for abdominal distention, abdominal pain, constipation, diarrhea, nausea and vomiting.  Musculoskeletal: Negative.   Skin: Negative.   Neurological: Negative.   Psychiatric/Behavioral: Negative.     Objective:  Physical Exam Constitutional:      Appearance: He is well-developed.  HENT:     Head: Normocephalic and atraumatic.  Cardiovascular:     Rate and Rhythm: Normal rate and regular rhythm.  Pulmonary:     Effort: Pulmonary effort is normal. No respiratory distress.     Breath sounds: Normal breath sounds. No wheezing or rales.  Abdominal:     General: Bowel sounds are normal. There is no distension.     Palpations: Abdomen is soft.     Tenderness: There is no abdominal tenderness. There is no rebound.  Musculoskeletal:     Cervical back: Normal range of motion.  Skin:    General: Skin is warm and dry.  Neurological:     Mental Status: He is alert and oriented to person, place, and time.     Coordination: Coordination normal.    Vitals:   09/08/20 0815  BP: 127/78  Pulse: 73  Resp: 18  Temp: 97.9 F (36.6 C)  TempSrc: Oral  SpO2: 99%  Weight: 196 lb 9.6 oz (89.2 kg)  Height: 6\' 1"  (1.854 m)    This visit occurred during the SARS-CoV-2 public health emergency.  Safety protocols were in place, including screening questions prior to the visit, additional usage of staff PPE, and extensive cleaning of exam room while observing appropriate contact time as indicated for disinfecting solutions.    Assessment & Plan:

## 2020-10-15 LAB — COLOGUARD: COLOGUARD: NEGATIVE

## 2021-01-16 ENCOUNTER — Other Ambulatory Visit: Payer: Self-pay | Admitting: Internal Medicine

## 2021-07-06 ENCOUNTER — Other Ambulatory Visit: Payer: Self-pay | Admitting: Internal Medicine

## 2021-08-27 ENCOUNTER — Other Ambulatory Visit: Payer: Self-pay | Admitting: Internal Medicine

## 2021-09-03 ENCOUNTER — Other Ambulatory Visit: Payer: Self-pay | Admitting: Internal Medicine

## 2021-09-03 DIAGNOSIS — I10 Essential (primary) hypertension: Secondary | ICD-10-CM

## 2021-09-09 ENCOUNTER — Other Ambulatory Visit: Payer: Self-pay | Admitting: Internal Medicine

## 2021-09-19 IMAGING — CT CT ABD-PELV W/ CM
2 of 5 series · 16 of 46 positions shown, 18 images · IV contrast (APPLIED)
Comparison: CT abdomen pelvis 12/26/2017

CLINICAL DATA: Abdominal distension. Centralized acute epigastric
pain with nausea and vomiting.

EXAM:
CT ABDOMEN AND PELVIS WITH CONTRAST
TECHNIQUE: Multidetector CT imaging of the abdomen and pelvis was performed
using the standard protocol following bolus administration of
intravenous contrast.
CONTRAST:  100mL OMNIPAQUE IOHEXOL 300 MG/ML  SOLN

[Series 3: abdomen 5.0 · axial · 0.74mm/px · z∈[-486,-116]mm · 13 of 86 slices shown, 15 images]
[im 6/86  soft-tissue]
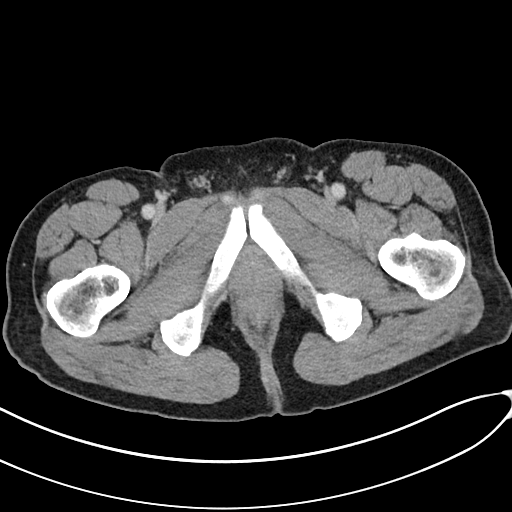
[im 6/86  bone]
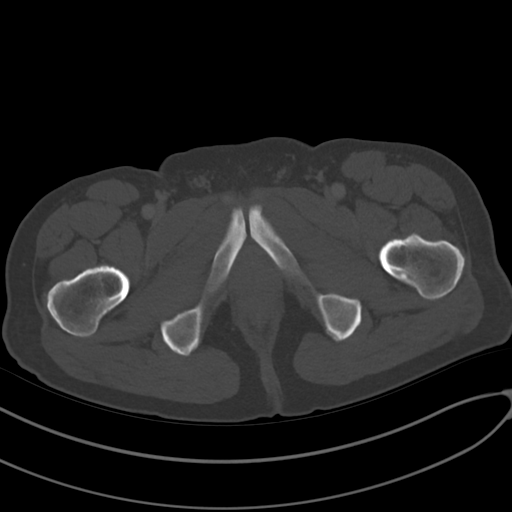
[im 11/86  soft-tissue]
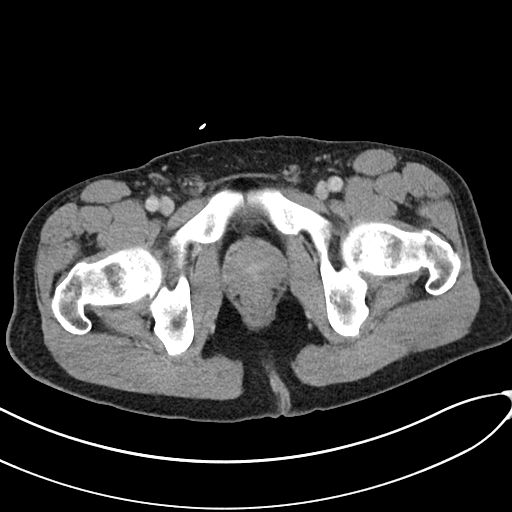
[im 16/86  soft-tissue]
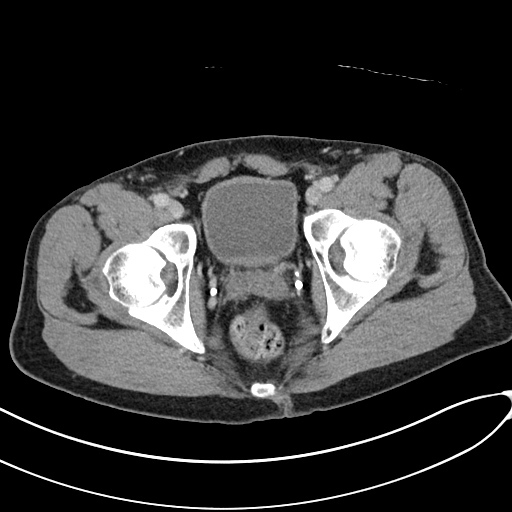
[im 27/86  soft-tissue]
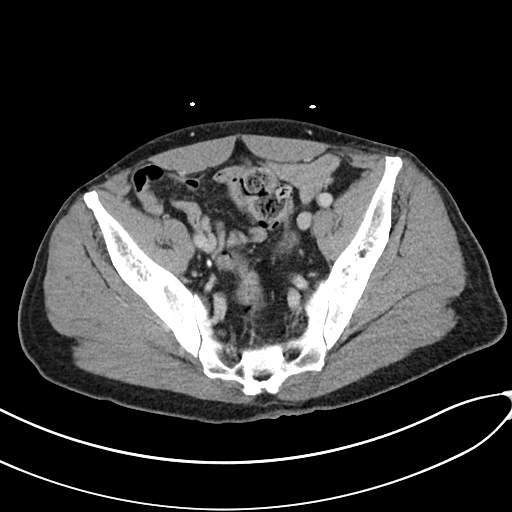
[im 32/86  soft-tissue]
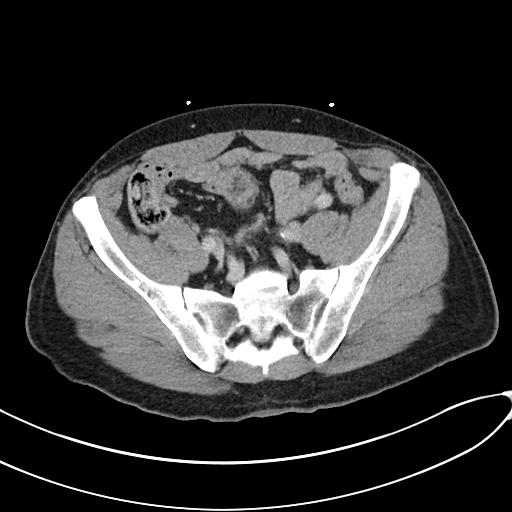
[im 38/86  soft-tissue]
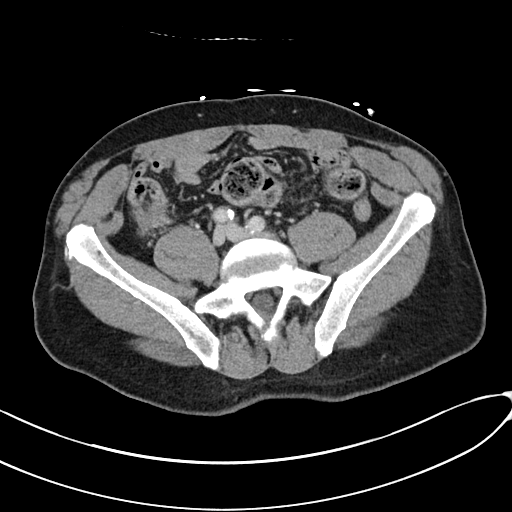
[im 43/86  soft-tissue]
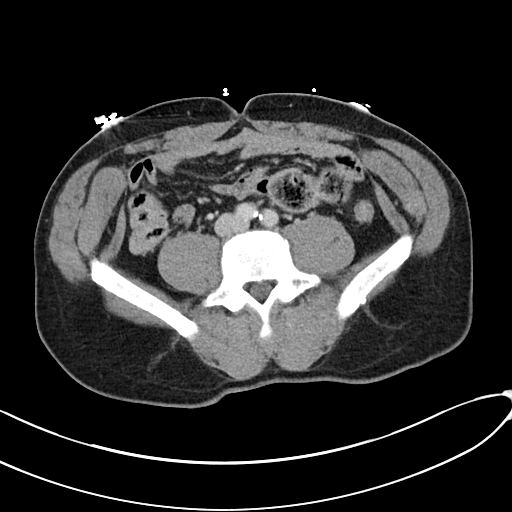
[im 48/86  soft-tissue]
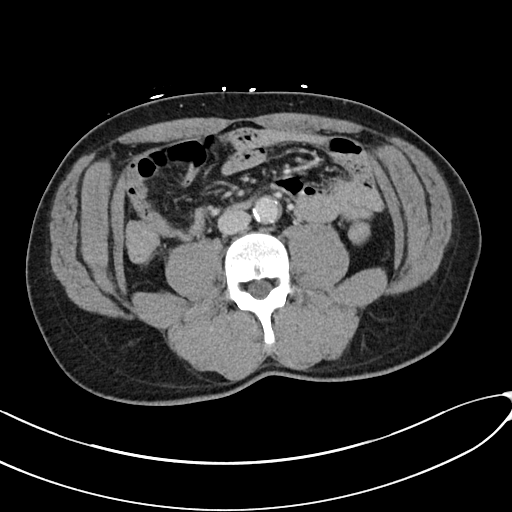
[im 54/86  soft-tissue]
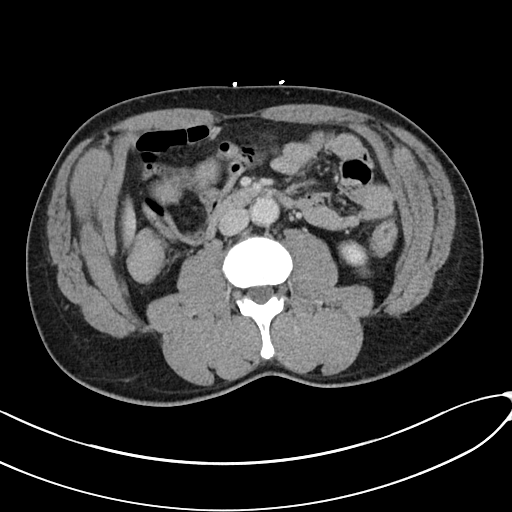
[im 54/86  bone]
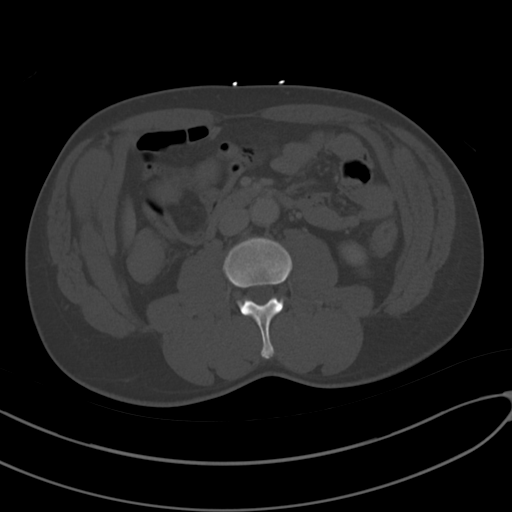
[im 59/86  soft-tissue]
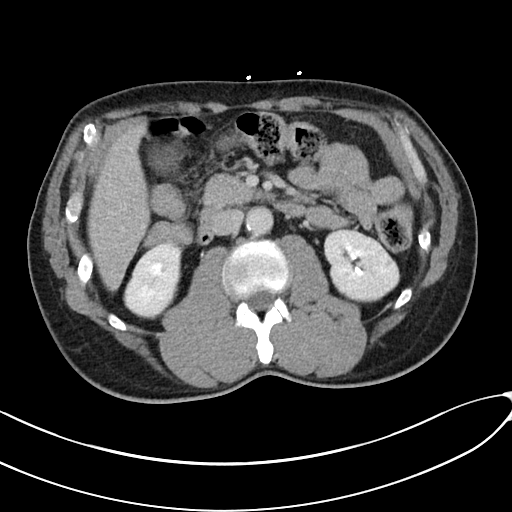
[im 70/86  soft-tissue]
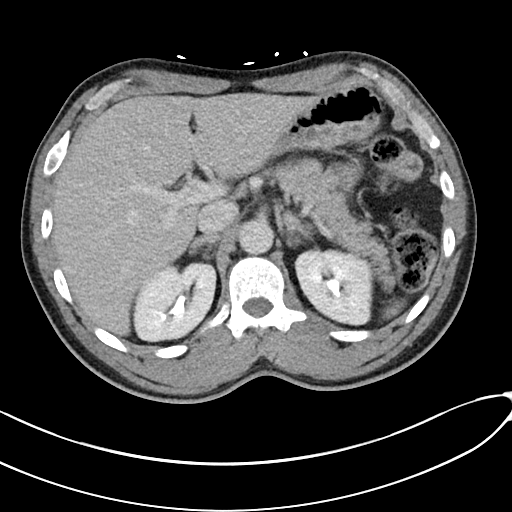
[im 75/86  soft-tissue]
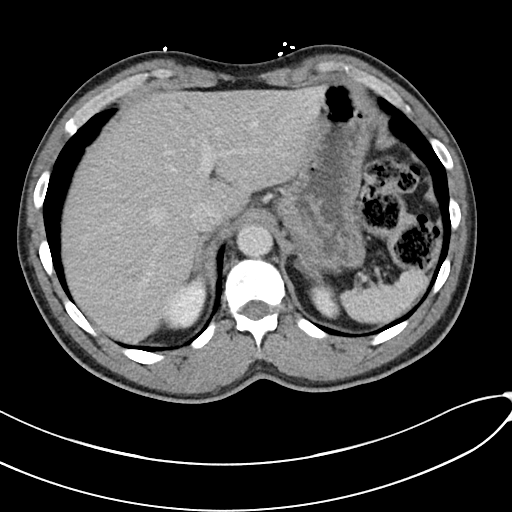
[im 80/86  soft-tissue]
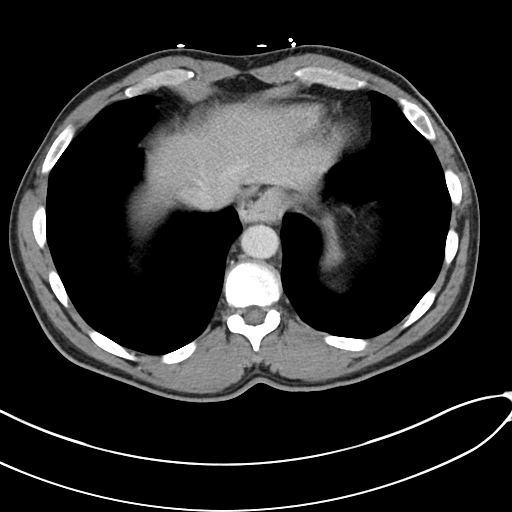

[Series 6: abdomen 3.0 mpr cor · coronal · 0.75mm/px · 3 of 87 slices shown]
[im 29/87  soft-tissue]
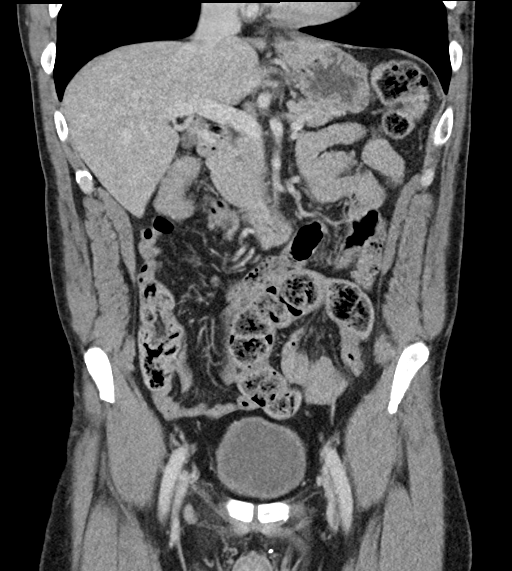
[im 39/87  soft-tissue]
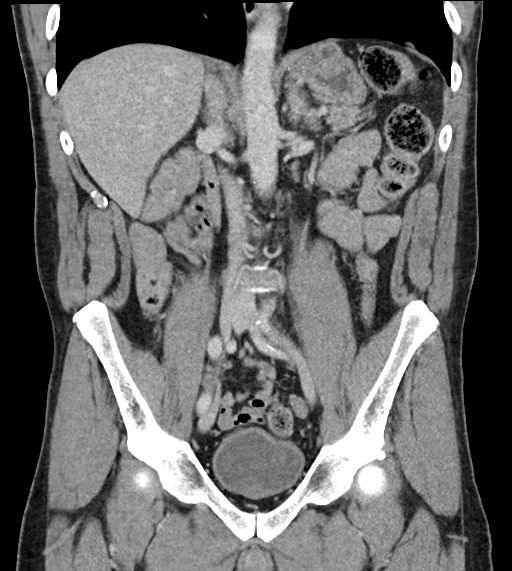
[im 48/87  soft-tissue]
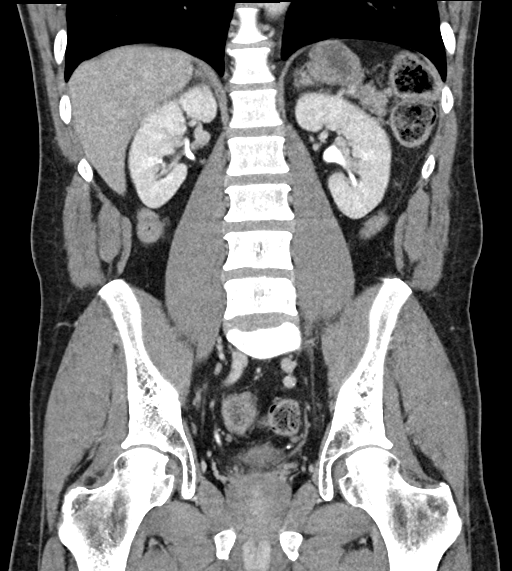

[16 of 46 positions shown; findings below may reference images not displayed]

FINDINGS: Lower chest: No acute abnormality.  Tiny hiatal hernia.

Hepatobiliary: No focal liver abnormality. No gallstones,
gallbladder wall thickening, or pericholecystic fluid. No biliary
dilatation.

Pancreas: No focal lesion. Normal pancreatic contour. No surrounding
inflammatory changes. No main pancreatic ductal dilatation.

Spleen: Normal in size without focal abnormality.

Adrenals/Urinary Tract:

No adrenal nodule bilaterally.

Bilateral kidneys excrete symmetrically. No hydronephrosis. No
hydroureter.

The urinary bladder is unremarkable.

On delayed imaging, there is no urothelial wall thickening and there
are no filling defects in the opacified portions of the bilateral
collecting systems or ureters.

Stomach/Bowel: Stomach is within normal limits. No evidence of bowel
wall thickening or dilatation. Appendix appears normal.

Vascular/Lymphatic: No abdominal aorta or iliac aneurysm. Moderate
calcified and noncalcified atherosclerotic plaque of the aorta and
its branches. No abdominal, pelvic, or inguinal lymphadenopathy.

Reproductive: Prostate is unremarkable.

Other: No intraperitoneal free fluid. No intraperitoneal free gas.
No organized fluid collection.

Musculoskeletal: No acute or significant osseous findings.
IMPRESSION: 1. Tiny hiatal hernia.
2. Otherwise no acute intra-abdominal and intrapelvic abnormality.
3.  Aortic Atherosclerosis ([id number]).

## 2021-09-19 IMAGING — DX DG CHEST 1V PORT
1 series · 1 of 1 positions shown · non-contrast
Comparison: 05/12/2019

CLINICAL DATA: Cough and fever

EXAM:
PORTABLE CHEST 1 VIEW

[chest]
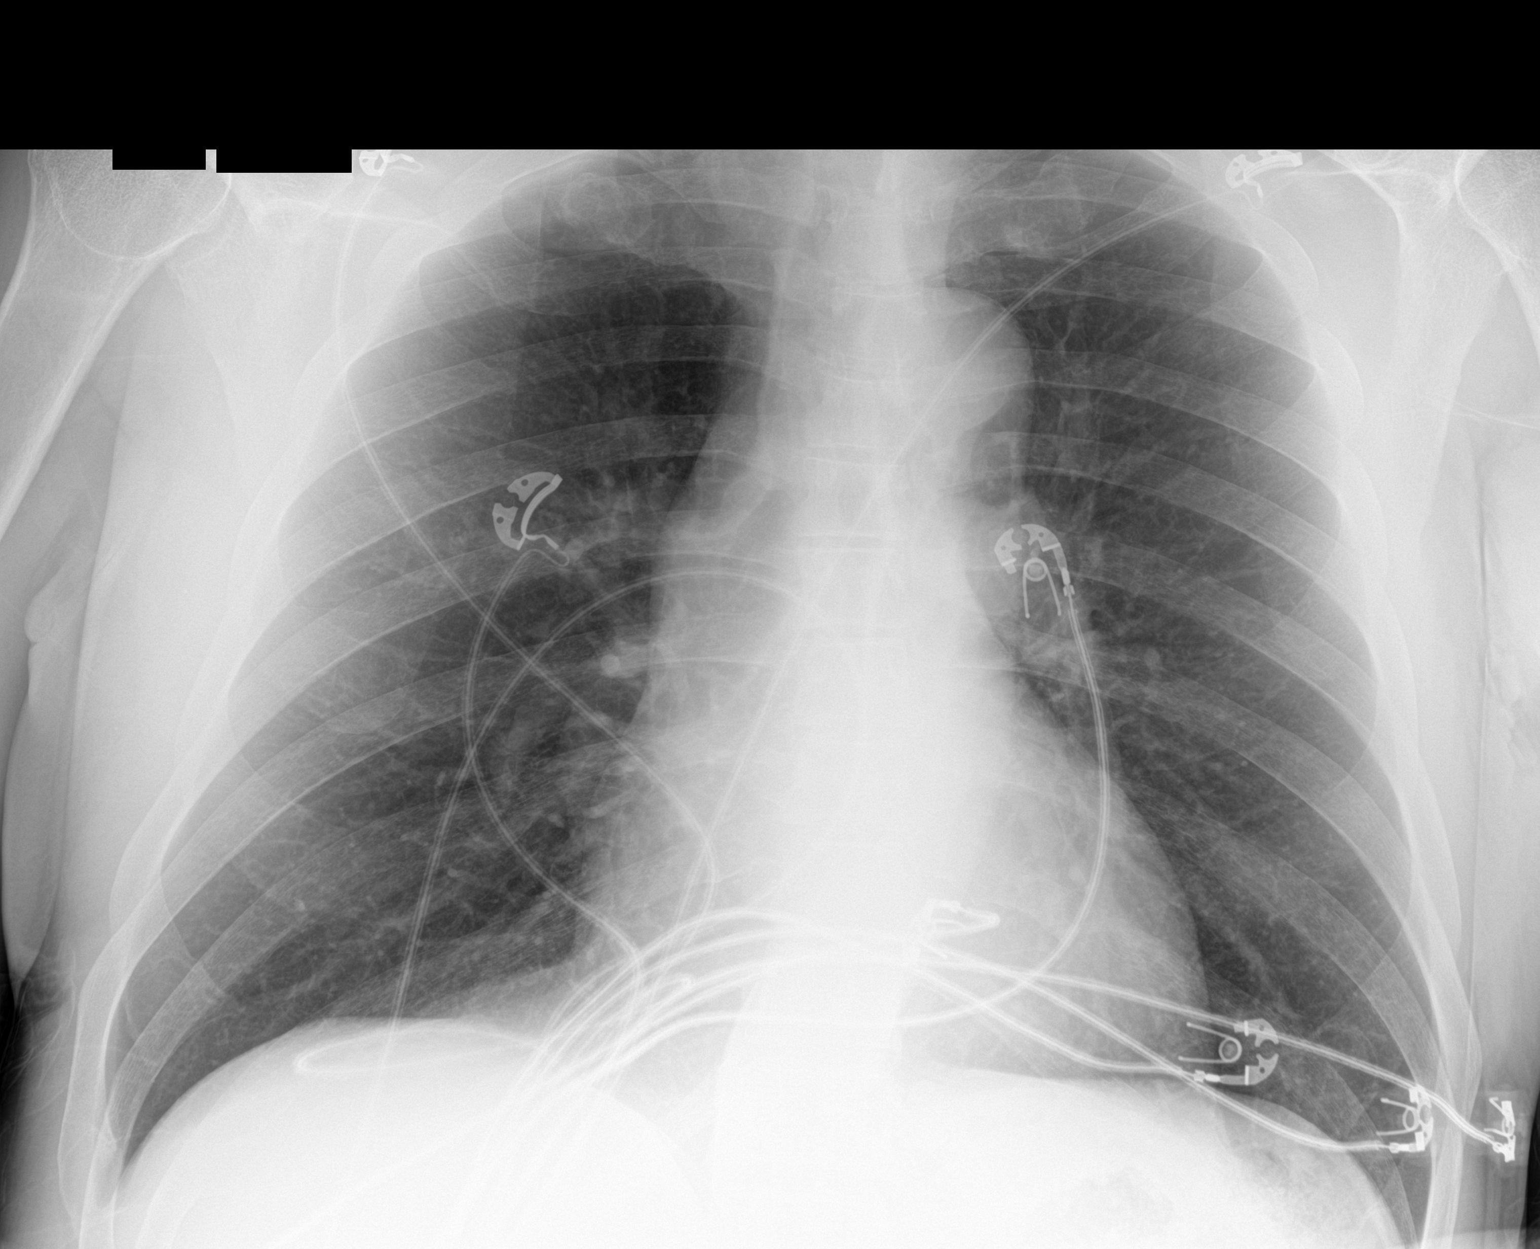

[1 of 1 positions shown; findings below may reference images not displayed]

FINDINGS: The heart size and mediastinal contours are within normal limits.
Both lungs are clear. The visualized skeletal structures are
unremarkable.
IMPRESSION: No active disease.

## 2021-10-17 ENCOUNTER — Other Ambulatory Visit: Payer: Self-pay | Admitting: Internal Medicine

## 2021-10-17 DIAGNOSIS — I10 Essential (primary) hypertension: Secondary | ICD-10-CM

## 2022-01-18 ENCOUNTER — Other Ambulatory Visit: Payer: Self-pay | Admitting: Internal Medicine

## 2022-02-16 ENCOUNTER — Ambulatory Visit (INDEPENDENT_AMBULATORY_CARE_PROVIDER_SITE_OTHER): Payer: 59 | Admitting: Internal Medicine

## 2022-02-16 ENCOUNTER — Encounter: Payer: Self-pay | Admitting: Internal Medicine

## 2022-02-16 VITALS — BP 140/100 | HR 78 | Temp 98.2°F | Ht 73.0 in | Wt 226.0 lb

## 2022-02-16 DIAGNOSIS — R7303 Prediabetes: Secondary | ICD-10-CM

## 2022-02-16 DIAGNOSIS — I10 Essential (primary) hypertension: Secondary | ICD-10-CM | POA: Diagnosis not present

## 2022-02-16 DIAGNOSIS — J431 Panlobular emphysema: Secondary | ICD-10-CM

## 2022-02-16 DIAGNOSIS — R1115 Cyclical vomiting syndrome unrelated to migraine: Secondary | ICD-10-CM

## 2022-02-16 DIAGNOSIS — Z0000 Encounter for general adult medical examination without abnormal findings: Secondary | ICD-10-CM

## 2022-02-16 DIAGNOSIS — R569 Unspecified convulsions: Secondary | ICD-10-CM

## 2022-02-16 LAB — COMPREHENSIVE METABOLIC PANEL
ALT: 13 U/L (ref 0–53)
AST: 14 U/L (ref 0–37)
Albumin: 4.2 g/dL (ref 3.5–5.2)
Alkaline Phosphatase: 52 U/L (ref 39–117)
BUN: 14 mg/dL (ref 6–23)
CO2: 27 mEq/L (ref 19–32)
Calcium: 9.1 mg/dL (ref 8.4–10.5)
Chloride: 106 mEq/L (ref 96–112)
Creatinine, Ser: 0.89 mg/dL (ref 0.40–1.50)
GFR: 98.35 mL/min (ref >60.00)
Glucose, Bld: 113 mg/dL — ABNORMAL HIGH (ref 70–99)
Potassium: 4 mEq/L (ref 3.5–5.1)
Sodium: 138 mEq/L (ref 135–145)
Total Bilirubin: 0.7 mg/dL (ref 0.2–1.2)
Total Protein: 7.6 g/dL (ref 6.0–8.3)

## 2022-02-16 LAB — LIPID PANEL
Cholesterol: 215 mg/dL — ABNORMAL HIGH (ref 0–200)
HDL: 41.7 mg/dL (ref >39.00)
LDL Cholesterol: 151 mg/dL — ABNORMAL HIGH (ref 0–99)
NonHDL: 173.64
Total CHOL/HDL Ratio: 5
Triglycerides: 114 mg/dL (ref 0.0–149.0)
VLDL: 22.8 mg/dL (ref 0.0–40.0)

## 2022-02-16 LAB — CBC
HCT: 40.8 % (ref 39.0–52.0)
Hemoglobin: 13.9 g/dL (ref 13.0–17.0)
MCHC: 34 g/dL (ref 30.0–36.0)
MCV: 97.2 fl (ref 78.0–100.0)
Platelets: 310 10*3/uL (ref 150.0–400.0)
RBC: 4.2 Mil/uL — ABNORMAL LOW (ref 4.22–5.81)
RDW: 13.8 % (ref 11.5–15.5)
WBC: 7.4 10*3/uL (ref 4.0–10.5)

## 2022-02-16 LAB — HEMOGLOBIN A1C: Hgb A1c MFr Bld: 6.3 % (ref 4.6–6.5)

## 2022-02-16 MED ORDER — ONDANSETRON HCL 4 MG PO TABS: ORAL_TABLET | ORAL | 2 refills | Status: DC

## 2022-02-16 MED ORDER — HYDRALAZINE HCL 25 MG PO TABS: 25.0000 mg | ORAL_TABLET | Freq: Two times a day (BID) | ORAL | 3 refills | Status: DC

## 2022-02-16 MED ORDER — CLONIDINE HCL 0.3 MG PO TABS: 0.3000 mg | ORAL_TABLET | Freq: Two times a day (BID) | ORAL | 3 refills | Status: DC

## 2022-02-16 MED ORDER — SILDENAFIL CITRATE 100 MG PO TABS: ORAL_TABLET | ORAL | 11 refills | Status: DC

## 2022-02-16 NOTE — Progress Notes (Unsigned)
   Subjective:   Patient ID: Anthony Atkinson, male    DOB: 1971-01-08, 51 y.o.   MRN: [id number]  HPI The patient is a 51 YO man coming in for physical.  PMH, FMH, social history reviewed and updated  Review of Systems  Constitutional: Negative.   HENT: Negative.    Eyes: Negative.   Respiratory:  Negative for cough, chest tightness and shortness of breath.   Cardiovascular:  Negative for chest pain, palpitations and leg swelling.  Gastrointestinal:  Negative for abdominal distention, abdominal pain, constipation, diarrhea, nausea and vomiting.  Musculoskeletal: Negative.   Skin: Negative.   Neurological: Negative.   Psychiatric/Behavioral: Negative.      Objective:  Physical Exam Constitutional:      Appearance: He is well-developed.  HENT:     Head: Normocephalic and atraumatic.  Cardiovascular:     Rate and Rhythm: Normal rate and regular rhythm.  Pulmonary:     Effort: Pulmonary effort is normal. No respiratory distress.     Breath sounds: Normal breath sounds. No wheezing or rales.  Abdominal:     General: Bowel sounds are normal. There is no distension.     Palpations: Abdomen is soft.     Tenderness: There is no abdominal tenderness. There is no rebound.  Musculoskeletal:     Cervical back: Normal range of motion.  Skin:    General: Skin is warm and dry.  Neurological:     Mental Status: He is alert and oriented to person, place, and time.     Coordination: Coordination normal.     Vitals:   02/16/22 0936 02/16/22 0945  BP: (!) 140/100 (!) 140/100  Pulse: 78   Temp: 98.2 F (36.8 C)   TempSrc: Oral   SpO2: 93%   Weight: 226 lb (102.5 kg)   Height: 6\' 1"  (1.854 m)     Assessment & Plan:

## 2022-02-16 NOTE — Assessment & Plan Note (Signed)
No further incident and suspected related to BP so no medication indicated at this time.

## 2022-02-16 NOTE — Assessment & Plan Note (Signed)
Uses zofran prn and no recent flare. Needs refill today which is done.

## 2022-02-16 NOTE — Assessment & Plan Note (Signed)
No SOB and not needing albuterol. Encouraged smoking cessation (marijuana smoking).

## 2022-02-16 NOTE — Assessment & Plan Note (Signed)
BP moderately elevated today due to lack of BP medication due to lack of visit. Previously at goal on hydralazine 25 mg BID and clonidine 0.3 mg BID. Refilled both for 1 year and asked to monitor BP on meds or return for BP check. Checking CMP and lipid panel.

## 2022-02-16 NOTE — Assessment & Plan Note (Signed)
Checking HgA1c and adjust as needed.  

## 2022-02-17 ENCOUNTER — Other Ambulatory Visit: Payer: Self-pay | Admitting: Internal Medicine

## 2022-02-17 DIAGNOSIS — R7303 Prediabetes: Secondary | ICD-10-CM

## 2022-02-17 NOTE — Assessment & Plan Note (Signed)
Flu shot declines. Covid-19 counseled. Shingrix declines. Tetanus up to date. Cologuard states he did in 2022 will need to get records. Counseled about sun safety and mole surveillance. Counseled about the dangers of distracted driving. Given 10 year screening recommendations.

## 2022-03-02 ENCOUNTER — Other Ambulatory Visit: Payer: Self-pay | Admitting: Internal Medicine

## 2022-04-06 ENCOUNTER — Ambulatory Visit: Payer: 59 | Admitting: Skilled Nursing Facility1

## 2022-11-25 ENCOUNTER — Ambulatory Visit (INDEPENDENT_AMBULATORY_CARE_PROVIDER_SITE_OTHER): Payer: 59 | Admitting: Internal Medicine

## 2022-11-25 ENCOUNTER — Encounter: Payer: Self-pay | Admitting: Internal Medicine

## 2022-11-25 VITALS — BP 126/84 | HR 77 | Temp 98.4°F | Ht 73.0 in | Wt 212.0 lb

## 2022-11-25 DIAGNOSIS — I10 Essential (primary) hypertension: Secondary | ICD-10-CM

## 2022-11-25 DIAGNOSIS — J431 Panlobular emphysema: Secondary | ICD-10-CM

## 2022-11-25 MED ORDER — CLONIDINE HCL 0.3 MG PO TABS: 0.3000 mg | ORAL_TABLET | Freq: Two times a day (BID) | ORAL | 3 refills | Status: DC

## 2022-11-25 MED ORDER — HYDRALAZINE HCL 25 MG PO TABS: 25.0000 mg | ORAL_TABLET | Freq: Two times a day (BID) | ORAL | 3 refills | Status: DC

## 2022-11-25 MED ORDER — ONDANSETRON HCL 4 MG PO TABS: ORAL_TABLET | ORAL | 1 refills | Status: DC

## 2022-11-25 MED ORDER — SILDENAFIL CITRATE 100 MG PO TABS: ORAL_TABLET | ORAL | 2 refills | Status: DC

## 2022-11-25 NOTE — Progress Notes (Signed)
Subjective:   Patient ID: Anthony Atkinson, male    DOB: 1970/03/27, 52 y.o.   MRN: [id number]  HPI The patient is a 52 YO man coming in for BP check.   Review of Systems  Constitutional: Negative.   HENT: Negative.    Eyes: Negative.   Respiratory:  Negative for cough, chest tightness and shortness of breath.   Cardiovascular:  Negative for chest pain, palpitations and leg swelling.  Gastrointestinal:  Negative for abdominal distention, abdominal pain, constipation, diarrhea, nausea and vomiting.  Musculoskeletal: Negative.   Skin: Negative.   Neurological: Negative.   Psychiatric/Behavioral: Negative.      Objective:  Physical Exam Constitutional:      Appearance: He is well-developed.  HENT:     Head: Normocephalic and atraumatic.  Cardiovascular:     Rate and Rhythm: Normal rate and regular rhythm.  Pulmonary:     Effort: Pulmonary effort is normal. No respiratory distress.     Breath sounds: Normal breath sounds. No wheezing or rales.  Abdominal:     General: Bowel sounds are normal. There is no distension.     Palpations: Abdomen is soft.     Tenderness: There is no abdominal tenderness. There is no rebound.  Musculoskeletal:     Cervical back: Normal range of motion.  Skin:    General: Skin is warm and dry.  Neurological:     Mental Status: He is alert and oriented to person, place, and time.     Coordination: Coordination normal.     Vitals:   11/25/22 0805  BP: 126/84  Pulse: 77  Temp: 98.4 F (36.9 C)  TempSrc: Oral  SpO2: 97%  Weight: 212 lb (96.2 kg)  Height: 6\' 1"  (1.854 m)    Assessment & Plan:

## 2022-11-25 NOTE — Assessment & Plan Note (Signed)
BP at goal today on clonidine 0.3 mg BID and hydralazine 25 mg BID refilled both and will continue same. Labs in dec with physical.

## 2022-11-25 NOTE — Assessment & Plan Note (Signed)
No flare today and still not smoking.

## 2023-01-15 ENCOUNTER — Other Ambulatory Visit: Payer: Self-pay | Admitting: Internal Medicine

## 2023-02-21 ENCOUNTER — Encounter: Payer: 59 | Admitting: Internal Medicine

## 2023-02-21 ENCOUNTER — Other Ambulatory Visit: Payer: Self-pay

## 2023-02-21 ENCOUNTER — Telehealth: Payer: Self-pay | Admitting: Internal Medicine

## 2023-02-21 DIAGNOSIS — I10 Essential (primary) hypertension: Secondary | ICD-10-CM

## 2023-02-21 NOTE — Telephone Encounter (Signed)
Noted  

## 2023-02-21 NOTE — Telephone Encounter (Signed)
Patient states that his prescriptions need to be ordered online, not through a pharmacy.

## 2023-07-31 ENCOUNTER — Other Ambulatory Visit: Payer: Self-pay | Admitting: Internal Medicine

## 2023-07-31 DIAGNOSIS — I10 Essential (primary) hypertension: Secondary | ICD-10-CM

## 2023-10-23 ENCOUNTER — Other Ambulatory Visit: Payer: Self-pay | Admitting: Internal Medicine

## 2024-01-16 ENCOUNTER — Encounter: Payer: Self-pay | Admitting: Internal Medicine

## 2024-01-16 ENCOUNTER — Ambulatory Visit (INDEPENDENT_AMBULATORY_CARE_PROVIDER_SITE_OTHER): Admitting: Internal Medicine

## 2024-01-16 VITALS — BP 132/80 | HR 80 | Temp 98.1°F | Ht 73.0 in | Wt 218.8 lb

## 2024-01-16 DIAGNOSIS — Z1211 Encounter for screening for malignant neoplasm of colon: Secondary | ICD-10-CM

## 2024-01-16 DIAGNOSIS — I5032 Chronic diastolic (congestive) heart failure: Secondary | ICD-10-CM

## 2024-01-16 DIAGNOSIS — Z0000 Encounter for general adult medical examination without abnormal findings: Secondary | ICD-10-CM

## 2024-01-16 DIAGNOSIS — R1115 Cyclical vomiting syndrome unrelated to migraine: Secondary | ICD-10-CM

## 2024-01-16 DIAGNOSIS — I10 Essential (primary) hypertension: Secondary | ICD-10-CM

## 2024-01-16 DIAGNOSIS — J431 Panlobular emphysema: Secondary | ICD-10-CM | POA: Diagnosis not present

## 2024-01-16 DIAGNOSIS — R7303 Prediabetes: Secondary | ICD-10-CM

## 2024-01-16 LAB — CBC
HCT: 41 % (ref 39.0–52.0)
Hemoglobin: 13.8 g/dL (ref 13.0–17.0)
MCHC: 33.7 g/dL (ref 30.0–36.0)
MCV: 99.4 fl (ref 78.0–100.0)
Platelets: 308 K/uL (ref 150.0–400.0)
RBC: 4.13 Mil/uL — ABNORMAL LOW (ref 4.22–5.81)
RDW: 14.2 % (ref 11.5–15.5)
WBC: 6.4 K/uL (ref 4.0–10.5)

## 2024-01-16 LAB — COMPREHENSIVE METABOLIC PANEL WITH GFR
ALT: 15 U/L (ref 0–53)
AST: 14 U/L (ref 0–37)
Albumin: 4.3 g/dL (ref 3.5–5.2)
Alkaline Phosphatase: 50 U/L (ref 39–117)
BUN: 14 mg/dL (ref 6–23)
CO2: 28 meq/L (ref 19–32)
Calcium: 9.1 mg/dL (ref 8.4–10.5)
Chloride: 105 meq/L (ref 96–112)
Creatinine, Ser: 0.9 mg/dL (ref 0.40–1.50)
GFR: 96.71 mL/min (ref >60.00)
Glucose, Bld: 100 mg/dL — ABNORMAL HIGH (ref 70–99)
Potassium: 4.1 meq/L (ref 3.5–5.1)
Sodium: 139 meq/L (ref 135–145)
Total Bilirubin: 0.4 mg/dL (ref 0.2–1.2)
Total Protein: 7.8 g/dL (ref 6.0–8.3)

## 2024-01-16 LAB — LIPID PANEL
Cholesterol: 207 mg/dL — ABNORMAL HIGH (ref 0–200)
HDL: 44.4 mg/dL (ref >39.00)
LDL Cholesterol: 127 mg/dL — ABNORMAL HIGH (ref 0–99)
NonHDL: 162.4
Total CHOL/HDL Ratio: 5
Triglycerides: 176 mg/dL — ABNORMAL HIGH (ref 0.0–149.0)
VLDL: 35.2 mg/dL (ref 0.0–40.0)

## 2024-01-16 LAB — HEMOGLOBIN A1C: Hgb A1c MFr Bld: 6.1 % (ref 4.6–6.5)

## 2024-01-16 MED ORDER — HYDRALAZINE HCL 25 MG PO TABS
25.0000 mg | ORAL_TABLET | Freq: Two times a day (BID) | ORAL | 3 refills | Status: AC
Start: 2024-01-16 — End: ?

## 2024-01-16 MED ORDER — SILDENAFIL CITRATE 100 MG PO TABS: ORAL_TABLET | ORAL | 2 refills | Status: AC

## 2024-01-16 MED ORDER — ONDANSETRON HCL 4 MG PO TABS: ORAL_TABLET | ORAL | 1 refills | Status: AC

## 2024-01-16 MED ORDER — CLONIDINE HCL 0.3 MG PO TABS: 0.3000 mg | ORAL_TABLET | Freq: Two times a day (BID) | ORAL | 3 refills | Status: AC

## 2024-01-16 NOTE — Assessment & Plan Note (Signed)
 Flu shot declines. Pneumonia declines. Shingrix declines. Tetanus declines. Cologuard ordered. Counseled about sun safety and mole surveillance. Counseled about the dangers of distracted driving. Given 10 year screening recommendations.

## 2024-01-16 NOTE — Assessment & Plan Note (Signed)
 No flare today and likely was caused by BP. This has been well controlled for some time. Continue tight BP control.

## 2024-01-16 NOTE — Assessment & Plan Note (Signed)
 Checking HgA1c and adjust as needed.

## 2024-01-16 NOTE — Assessment & Plan Note (Signed)
 Refill zofran  and can use as needed.

## 2024-01-16 NOTE — Assessment & Plan Note (Signed)
 No change in SOB and no albuterol  needed at this time. Is still cigarette free and encouraged lifelong cessation.

## 2024-01-16 NOTE — Progress Notes (Signed)
   Subjective:   Patient ID: Anthony Atkinson, male    DOB: January 13, 1971, 53 y.o.   MRN: [id number]  The patient is here for physical. Pertinent topics discussed: Discussed the use of AI scribe software for clinical note transcription with the patient, who gave verbal consent to proceed.  History of Present Illness Anthony Atkinson is a 53 year old male who presents for a routine follow-up visit.  He mentions a decrease in physical activity due to a change in his job two years ago, resulting in fewer daily steps from twenty thousand to six thousand.  PMH, [facility], social history reviewed and updated  Review of Systems  Constitutional: Negative.   HENT: Negative.    Eyes: Negative.   Respiratory:  Negative for cough, chest tightness and shortness of breath.   Cardiovascular:  Negative for chest pain, palpitations and leg swelling.  Gastrointestinal:  Negative for abdominal distention, abdominal pain, constipation, diarrhea, nausea and vomiting.  Musculoskeletal: Negative.   Skin: Negative.   Neurological: Negative.   Psychiatric/Behavioral: Negative.      Objective:  Physical Exam Constitutional:      Appearance: He is well-developed.  HENT:     Head: Normocephalic and atraumatic.  Cardiovascular:     Rate and Rhythm: Normal rate and regular rhythm.  Pulmonary:     Effort: Pulmonary effort is normal. No respiratory distress.     Breath sounds: Normal breath sounds. No wheezing or rales.  Abdominal:     General: Bowel sounds are normal. There is no distension.     Palpations: Abdomen is soft.     Tenderness: There is no abdominal tenderness.  Musculoskeletal:     Cervical back: Normal range of motion.  Skin:    General: Skin is warm and dry.  Neurological:     Mental Status: He is alert and oriented to person, place, and time.     Coordination: Coordination normal.     Vitals:   01/16/24 0813  BP: 132/80  Pulse: 80  Temp: 98.1 F (36.7 C)  TempSrc: Oral  SpO2: 98%   Weight: 218 lb 12.8 oz (99.2 kg)  Height: 6' 1 (1.854 m)    Assessment & Plan:

## 2024-01-16 NOTE — Assessment & Plan Note (Signed)
 BP at goal on regimen will continue clonidine  and hydralazine . Checking CMP and CBC and lipid panel.

## 2024-01-17 ENCOUNTER — Ambulatory Visit: Payer: Self-pay | Admitting: Internal Medicine

## 2024-01-20 ENCOUNTER — Other Ambulatory Visit: Payer: Self-pay | Admitting: Internal Medicine

## 2024-01-20 DIAGNOSIS — I10 Essential (primary) hypertension: Secondary | ICD-10-CM

## 2024-02-06 LAB — COLOGUARD: COLOGUARD: NEGATIVE
